# Patient Record
Sex: Male | Born: 1965
Health system: Southern US, Community
[De-identification: ages and names within clinical notes are randomized; demographics above are authoritative.]

## PROBLEM LIST (undated history)

## (undated) DIAGNOSIS — E119 Type 2 diabetes mellitus without complications: Secondary | ICD-10-CM

## (undated) DIAGNOSIS — E079 Disorder of thyroid, unspecified: Secondary | ICD-10-CM

## (undated) DIAGNOSIS — L80 Vitiligo: Secondary | ICD-10-CM

## (undated) DIAGNOSIS — B019 Varicella without complication: Secondary | ICD-10-CM

## (undated) DIAGNOSIS — I10 Essential (primary) hypertension: Secondary | ICD-10-CM

## (undated) DIAGNOSIS — T7840XA Allergy, unspecified, initial encounter: Secondary | ICD-10-CM

## (undated) DIAGNOSIS — J45909 Unspecified asthma, uncomplicated: Secondary | ICD-10-CM

## (undated) DIAGNOSIS — L630 Alopecia (capitis) totalis: Secondary | ICD-10-CM

## (undated) HISTORY — DX: Allergy, unspecified, initial encounter: T78.40XA

## (undated) HISTORY — DX: Varicella without complication: B01.9

## (undated) HISTORY — DX: Essential (primary) hypertension: I10

## (undated) HISTORY — DX: Alopecia (capitis) totalis: L63.0

## (undated) HISTORY — PX: CARPAL TUNNEL RELEASE: SHX101

## (undated) HISTORY — DX: Disorder of thyroid, unspecified: E07.9

## (undated) HISTORY — DX: Vitiligo: L80

## (undated) HISTORY — DX: Unspecified asthma, uncomplicated: J45.909

## (undated) HISTORY — DX: Type 2 diabetes mellitus without complications: E11.9

---

## 1992-07-13 HISTORY — PX: NASAL SINUS SURGERY: SHX719

## 2000-08-26 ENCOUNTER — Encounter: Payer: Self-pay | Admitting: Family Medicine

## 2000-08-26 ENCOUNTER — Ambulatory Visit (HOSPITAL_COMMUNITY): Admission: RE | Admit: 2000-08-26 | Discharge: 2000-08-26 | Payer: Self-pay | Admitting: Family Medicine

## 2003-01-26 ENCOUNTER — Ambulatory Visit (HOSPITAL_BASED_OUTPATIENT_CLINIC_OR_DEPARTMENT_OTHER): Admission: RE | Admit: 2003-01-26 | Discharge: 2003-01-26 | Payer: Self-pay | Admitting: Orthopedic Surgery

## 2003-02-14 ENCOUNTER — Ambulatory Visit (HOSPITAL_BASED_OUTPATIENT_CLINIC_OR_DEPARTMENT_OTHER): Admission: RE | Admit: 2003-02-14 | Discharge: 2003-02-14 | Payer: Self-pay | Admitting: Orthopedic Surgery

## 2013-02-03 ENCOUNTER — Ambulatory Visit: Payer: Self-pay | Admitting: Endocrinology

## 2013-02-07 ENCOUNTER — Telehealth: Payer: Self-pay | Admitting: Endocrinology

## 2013-02-07 ENCOUNTER — Ambulatory Visit (INDEPENDENT_AMBULATORY_CARE_PROVIDER_SITE_OTHER): Payer: BC Managed Care – PPO

## 2013-02-07 ENCOUNTER — Other Ambulatory Visit: Payer: Self-pay | Admitting: *Deleted

## 2013-02-07 DIAGNOSIS — R7301 Impaired fasting glucose: Secondary | ICD-10-CM

## 2013-02-07 DIAGNOSIS — E039 Hypothyroidism, unspecified: Secondary | ICD-10-CM

## 2013-02-07 LAB — LIPID PANEL
Cholesterol: 96 mg/dL (ref 0–200)
LDL Cholesterol: 29 mg/dL (ref 0–99)
Total CHOL/HDL Ratio: 3
VLDL: 29.8 mg/dL (ref 0.0–40.0)

## 2013-02-07 LAB — COMPREHENSIVE METABOLIC PANEL
AST: 39 U/L — ABNORMAL HIGH (ref 0–37)
Albumin: 4.4 g/dL (ref 3.5–5.2)
Alkaline Phosphatase: 38 U/L — ABNORMAL LOW (ref 39–117)
Glucose, Bld: 109 mg/dL — ABNORMAL HIGH (ref 70–99)
Potassium: 4 mEq/L (ref 3.5–5.1)
Sodium: 137 mEq/L (ref 135–145)
Total Bilirubin: 1.1 mg/dL (ref 0.3–1.2)
Total Protein: 7.1 g/dL (ref 6.0–8.3)

## 2013-02-07 LAB — T4: T4, Total: 10.1 ug/dL (ref 5.0–12.5)

## 2013-02-07 LAB — HEMOGLOBIN A1C: Hgb A1c MFr Bld: 5.2 % (ref 4.6–6.5)

## 2013-02-07 MED ORDER — LEVOTHYROXINE SODIUM 150 MCG PO TABS: 150.0000 ug | ORAL_TABLET | Freq: Every day | ORAL | Status: DC

## 2013-02-07 NOTE — Telephone Encounter (Signed)
Pt has script for Synthroid on order w/ [facility] but he will run out before his shipment arrives. Pt requests we call in a 1 month supply to [facility] in [place] to allow him to continue his medication while awaiting shipment. / Roanna Raider

## 2013-02-07 NOTE — Telephone Encounter (Signed)
rx was sent

## 2013-02-09 ENCOUNTER — Ambulatory Visit (INDEPENDENT_AMBULATORY_CARE_PROVIDER_SITE_OTHER): Payer: BC Managed Care – PPO | Admitting: Endocrinology

## 2013-02-09 ENCOUNTER — Encounter: Payer: Self-pay | Admitting: Endocrinology

## 2013-02-09 VITALS — BP 124/82 | HR 78 | Temp 99.2°F | Resp 12 | Ht 70.0 in | Wt 225.0 lb

## 2013-02-09 DIAGNOSIS — E119 Type 2 diabetes mellitus without complications: Secondary | ICD-10-CM

## 2013-02-09 DIAGNOSIS — I10 Essential (primary) hypertension: Secondary | ICD-10-CM

## 2013-02-09 DIAGNOSIS — E039 Hypothyroidism, unspecified: Secondary | ICD-10-CM

## 2013-02-09 NOTE — Patient Instructions (Signed)
Restart diet and exercise

## 2013-02-09 NOTE — Progress Notes (Signed)
Patient ID: Mark Black, male   DOB: 1966/04/16, 47 y.o.   MRN: [id number]  Reason for Appointment:   followup visit    History of Present Illness:    DIABETES: He has a family history of diabetes and his fasting glucose has been high since 2011 when it was 109.  Subsequently has been 119 in 4/12 and 113 in 3/13. Has had a GTT in 12/13 showing FASTING glucose of 121, two-hour reading of 245.   He has been told to increase his efforts to lose weight and is variably watching his diet; usually avoiding fast food. However his compliance with this has been variable over the last couple of years  He was started on metformin ER 1000 mg after the GTT results and he is tolerating this well.  FASTING blood sugar is now 109, previously 104 and A1c is again 5.2  .He is generally trying to follow his diet with help of his wife and this time has  lost weight. He thinks he had lost more weight before he went on vacation recently Has been active with construction work, also doing walking and weights  Hypertension:    has had long-standing hypertension  Does not monitor his blood pressure at home as he does not have a functioning Monitor.  He has been on low dose Ziac since 2013 in addition to Lotrel for better control. Also had a relatively high pulse rate of 80-90 previously. Blood pressure is excellent now without lightheadedness.   Hypothyroidism:   The hypothyroidism was first diagnosed In 1996 with weight gain, feeling sluggish  The last TSH was performed On 09/30/12, and the result was 1.84.   The treatments that the patient has taken include Synthroid brand name and more centrally generic.       No recent changes made in his dosage      The response to therapy has been  good with normal energy level including recently           Compliance with the medical regimen has been as prescribed with taking the tablet in the morning before breakfast.  Appointment on 02/07/2013  Component Date Value  Range Status  . Hemoglobin A1C 02/07/2013 5.2  4.6 - 6.5 % Final   Glycemic Control Guidelines for People with Diabetes:Non Diabetic:  <6%Goal of Therapy: <7%Additional Action Suggested:  >8%   . T4, Total 02/07/2013 10.1  5.0 - 12.5 ug/dL Final  . Sodium 78/29/5621 137  135 - 145 mEq/L Final  . Potassium 02/07/2013 4.0  3.5 - 5.1 mEq/L Final  . Chloride 02/07/2013 102  96 - 112 mEq/L Final  . CO2 02/07/2013 26  19 - 32 mEq/L Final  . Glucose, Bld 02/07/2013 109* 70 - 99 mg/dL Final  . BUN 30/86/5784 15  6 - 23 mg/dL Final  . Creatinine, Ser 02/07/2013 0.7  0.4 - 1.5 mg/dL Final  . Total Bilirubin 02/07/2013 1.1  0.3 - 1.2 mg/dL Final  . Alkaline Phosphatase 02/07/2013 38* 39 - 117 U/L Final  . AST 02/07/2013 39* 0 - 37 U/L Final  . ALT 02/07/2013 48  0 - 53 U/L Final  . Total Protein 02/07/2013 7.1  6.0 - 8.3 g/dL Final  . Albumin 69/62/9528 4.4  3.5 - 5.2 g/dL Final  . Calcium 41/32/4401 9.5  8.4 - 10.5 mg/dL Final  . GFR 02/72/5366 121.34  >60.00 mL/min Final  . Cholesterol 02/07/2013 96  0 - 200 mg/dL Final   ATP III Classification  Desirable:  < 200 mg/dL               Borderline High:  200 - 239 mg/dL          High:  > = 454 mg/dL  . Triglycerides 02/07/2013 149.0  0.0 - 149.0 mg/dL Final   Normal:  <098 mg/dLBorderline High:  150 - 199 mg/dL  . HDL 02/07/2013 36.80* >39.00 mg/dL Final  . VLDL 11/91/4782 29.8  0.0 - 40.0 mg/dL Final  . LDL Cholesterol 02/07/2013 29  0 - 99 mg/dL Final  . Total CHOL/HDL Ratio 02/07/2013 3   Final                  Men          Women1/2 Average Risk     3.4          3.3Average Risk          5.0          4.42X Average Risk          9.6          7.13X Average Risk          15.0          11.0                      . TSH 02/07/2013 0.93  0.35 - 5.50 uIU/mL Final      Medication List       This list is accurate as of: 02/09/13 11:59 PM.  Always use your most recent med list.               amLODipine-benazepril 10-40 MG per capsule  Commonly  known as:  LOTREL  10 capsules.     bisoprolol-hydrochlorothiazide 2.5-6.25 MG per tablet  Commonly known as:  ZIAC     levothyroxine 150 MCG tablet  Commonly known as:  SYNTHROID, LEVOTHROID  Take 1 tablet (150 mcg total) by mouth daily.     metformin 1000 MG (OSM) 24 hr tablet  Commonly known as:  FORTAMET     methocarbamol 750 MG tablet  Commonly known as:  ROBAXIN     PRISTIQ 50 MG 24 hr tablet  Generic drug:  desvenlafaxine        Past Medical History  Diagnosis Date  . Vitiligo   . Alopecia totalis     Past Surgical History  Procedure Laterality Date  . Carpal tunnel release      Family History  Problem Relation Age of Onset  . Diabetes Father   . Hypertension Father   . Diabetes Paternal Aunt     Social History:  reports that he has never smoked. He does not have any smokeless tobacco history on file. His alcohol and drug histories are not on file.  REVIEW of systems:  Has history of depression, well controlled  Allergies: No Known Allergies   Examination:   BP 124/82  Pulse 78  Temp(Src) 99.2 F (37.3 C)  Resp 12  Ht 5\' 10"  (1.778 m)  Wt 225 lb (102.059 kg)  BMI 32.28 kg/m2  SpO2 97%   GENERAL APPEARANCE: Alert And looks well.    FACE: No puffiness of face or hands    NECK: no thyromegaly.          NEUROLOGIC EXAM: DTRs 2+ bilaterally at biceps.    Assessments   Hypothyroidism treated with 150 mcg generic thyroxine and excellent clinical response as  well as normal TSH of 0.93  DIABETES: This has been diagnosed on glucose tolerance test and he is relatively better with his lifestyle and able to lose weight However her fasting glucoses relatively higher at this time possibly from been inconsistent on his vacation recently and gaining back some weight Discussed need for long-term management of lifestyle to avoid progression To continue metformin  Hypertension: Well controlled   Treatment:   Continue same levothyroxine dosage before  breakfast daily. Avoid taking any calcium or iron supplements with the thyroid supplement.    Jamelle Noy 02/10/2013, 3:36 PM

## 2013-02-10 ENCOUNTER — Encounter: Payer: Self-pay | Admitting: Endocrinology

## 2013-02-10 DIAGNOSIS — I10 Essential (primary) hypertension: Secondary | ICD-10-CM

## 2013-02-10 DIAGNOSIS — E039 Hypothyroidism, unspecified: Secondary | ICD-10-CM

## 2013-02-10 DIAGNOSIS — R7303 Prediabetes: Secondary | ICD-10-CM | POA: Insufficient documentation

## 2013-02-10 HISTORY — DX: Essential (primary) hypertension: I10

## 2013-02-10 HISTORY — DX: Hypothyroidism, unspecified: E03.9

## 2013-03-23 ENCOUNTER — Other Ambulatory Visit: Payer: Self-pay | Admitting: Endocrinology

## 2013-03-24 ENCOUNTER — Telehealth: Payer: Self-pay | Admitting: Endocrinology

## 2013-03-24 MED ORDER — DESVENLAFAXINE SUCCINATE ER 50 MG PO TB24: 50.0000 mg | ORAL_TABLET | Freq: Every day | ORAL | Status: DC

## 2013-03-24 NOTE — Telephone Encounter (Signed)
Refill request for Pristiq Last filled by MD on - ? Last Seen- 02/09/13 Next Appt: 06/12/13 Please advise refill?

## 2013-03-24 NOTE — Telephone Encounter (Signed)
rx sent

## 2013-03-27 ENCOUNTER — Other Ambulatory Visit: Payer: Self-pay | Admitting: *Deleted

## 2013-03-27 NOTE — Telephone Encounter (Signed)
Ok to fill 

## 2013-06-06 ENCOUNTER — Other Ambulatory Visit (INDEPENDENT_AMBULATORY_CARE_PROVIDER_SITE_OTHER): Payer: BC Managed Care – PPO

## 2013-06-06 DIAGNOSIS — E039 Hypothyroidism, unspecified: Secondary | ICD-10-CM

## 2013-06-06 DIAGNOSIS — E119 Type 2 diabetes mellitus without complications: Secondary | ICD-10-CM

## 2013-06-06 LAB — COMPREHENSIVE METABOLIC PANEL
ALT: 50 U/L (ref 0–53)
Albumin: 4.3 g/dL (ref 3.5–5.2)
CO2: 27 mEq/L (ref 19–32)
Calcium: 9.4 mg/dL (ref 8.4–10.5)
Chloride: 105 mEq/L (ref 96–112)
GFR: 107.47 mL/min (ref >60.00)
Glucose, Bld: 114 mg/dL — ABNORMAL HIGH (ref 70–99)
Potassium: 4.2 mEq/L (ref 3.5–5.1)
Sodium: 136 mEq/L (ref 135–145)
Total Protein: 7.1 g/dL (ref 6.0–8.3)

## 2013-06-09 ENCOUNTER — Other Ambulatory Visit: Payer: BC Managed Care – PPO

## 2013-06-12 ENCOUNTER — Ambulatory Visit (INDEPENDENT_AMBULATORY_CARE_PROVIDER_SITE_OTHER): Payer: BC Managed Care – PPO | Admitting: Endocrinology

## 2013-06-12 ENCOUNTER — Encounter: Payer: Self-pay | Admitting: Endocrinology

## 2013-06-12 ENCOUNTER — Other Ambulatory Visit: Payer: Self-pay | Admitting: *Deleted

## 2013-06-12 VITALS — BP 118/68 | HR 80 | Temp 98.5°F | Resp 12 | Ht 70.0 in | Wt 223.2 lb

## 2013-06-12 DIAGNOSIS — M5380 Other specified dorsopathies, site unspecified: Secondary | ICD-10-CM

## 2013-06-12 DIAGNOSIS — M62830 Muscle spasm of back: Secondary | ICD-10-CM

## 2013-06-12 DIAGNOSIS — E039 Hypothyroidism, unspecified: Secondary | ICD-10-CM

## 2013-06-12 DIAGNOSIS — E119 Type 2 diabetes mellitus without complications: Secondary | ICD-10-CM

## 2013-06-12 DIAGNOSIS — I10 Essential (primary) hypertension: Secondary | ICD-10-CM

## 2013-06-12 LAB — URINALYSIS
Bilirubin Urine: NEGATIVE
Hgb urine dipstick: NEGATIVE
Ketones, ur: NEGATIVE
Specific Gravity, Urine: 1.015 (ref 1.000–1.030)
Total Protein, Urine: NEGATIVE
Urine Glucose: NEGATIVE
Urobilinogen, UA: 0.2 (ref 0.0–1.0)

## 2013-06-12 MED ORDER — METHOCARBAMOL 750 MG PO TABS: 750.0000 mg | ORAL_TABLET | Freq: Three times a day (TID) | ORAL | Status: DC

## 2013-06-12 NOTE — Patient Instructions (Signed)
Check BP at home  Resume exercise when able to Watch calories

## 2013-06-12 NOTE — Progress Notes (Signed)
Patient ID: Mark Black, male   DOB: 1965/11/08, 47 y.o.   MRN: [id number]  Reason for Appointment:   followup visit   History of Present Illness:   DIABETES: He has a family history of diabetes and his fasting glucose has been high since 2011 when it was 109.  Subsequently fasting readings: 119 in 4/12, 113 in 3/13.   GTT in 12/13 showed FASTING glucose of 121, two-hour reading of 245. He was started on metformin ER 1000 mg after the GTT results and he is tolerating this well.  He has been told to increase his efforts to lose weight and is variably watching his diet; usually avoiding fast food. 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  He has been on low dose Ziac since 2013 in addition to Lotrel for better control. Also had a relatively high pulse rate of 80-90 previously. Blood pressure is excellent now without lightheadedness.   Hypothyroidism:   The hypothyroidism was first diagnosed In 1996 with weight gain, feeling sluggish   The treatments that the patient has taken include Synthroid brand name and more centrally generic.       No recent changes made in his dosage      The response to therapy has been  good with normal energy level, now feels fairly good        Compliance with the medical regimen has been as prescribed with taking the tablet in the morning before  breakfast.  Low back pain: Last week he got hit while playing football and has had pain on his right middle back which is worse on movement. No pain on deep breathing. However feels better with taking Robaxin which was left over from a previous prescription  LABS:  Appointment on 06/06/2013  Component Date Value Range Status  . Sodium 06/06/2013 136  135 - 145 mEq/L Final  . Potassium 06/06/2013 4.2  3.5 - 5.1 mEq/L Final  . Chloride 06/06/2013 105  96 - 112 mEq/L Final  . CO2 06/06/2013 27  19 - 32 mEq/L Final  . Glucose, Bld 06/06/2013 114* 70 - 99 mg/dL Final  . BUN 40/98/1191 14  6 - 23 mg/dL Final  . Creatinine, Ser 06/06/2013 0.8  0.4 - 1.5 mg/dL Final  . Total Bilirubin 06/06/2013 0.6  0.3 - 1.2 mg/dL Final  . Alkaline Phosphatase 06/06/2013 38* 39 - 117 U/L Final  . AST 06/06/2013 43* 0 - 37 U/L Final  . ALT 06/06/2013 50  0 - 53 U/L Final  . Total Protein 06/06/2013 7.1  6.0 - 8.3 g/dL Final  . Albumin 47/82/9562 4.3  3.5 - 5.2 g/dL Final  . Calcium 13/02/6577 9.4  8.4 - 10.5 mg/dL Final  . GFR 46/96/2952 107.47  >60.00 mL/min Final  . Hemoglobin A1C 06/06/2013 5.4  4.6 - 6.5 %  Final   Glycemic Control Guidelines for People with Diabetes:Non Diabetic:  <6%Goal of Therapy: <7%Additional Action Suggested:  >8%   . Free T4 06/06/2013 1.27  0.60 - 1.60 ng/dL Final  . TSH 16/04/9603 0.92  0.35 - 5.50 uIU/mL Final      Medication List       This list is accurate as of: 06/12/13  3:33 PM.  Always use your most recent med list.               amLODipine-benazepril 10-40 MG per capsule  Commonly known as:  LOTREL  10 capsules.     bisoprolol-hydrochlorothiazide 2.5-6.25 MG per tablet  Commonly known as:  ZIAC     desvenlafaxine 50 MG 24 hr tablet  Commonly known as:  PRISTIQ  Take 1 tablet (50 mg total) by mouth daily.     levothyroxine 150 MCG tablet  Commonly known as:  SYNTHROID, LEVOTHROID  Take 1 tablet (150 mcg total) by mouth daily.     metformin 1000 MG  (OSM) 24 hr tablet  Commonly known as:  FORTAMET     methocarbamol 750 MG tablet  Commonly known as:  ROBAXIN        Past Medical History  Diagnosis Date  . Vitiligo   . Alopecia totalis     Past Surgical History  Procedure Laterality Date  . Carpal tunnel release      Family History  Problem Relation Age of Onset  . Diabetes Father   . Hypertension Father   . Diabetes Paternal Aunt     Social History:  reports that he has never smoked. He does not have any smokeless tobacco history on file. His alcohol and drug histories are not on file.  REVIEW of systems:  Has history of depression, well controlled  Had long-standing vitiligo  No history of hyperlipidemia. He is total cholesterol is below 100 but also has a low HDL  Allergies: No Known Allergies   Examination:   BP 118/68  Pulse 80  Temp(Src) 98.5 F (36.9 C)  Resp 12  Ht 5\' 10"  (1.778 m)  Wt 223 lb 3.2 oz (101.243 kg)  BMI 32.03 kg/m2  SpO2 95%  Repeat blood pressure 120/80  Has significant muscle spasm in the right paravertebral area of lower thoracic/upper lumbar region. No tenderness over the spine or rib cage    Assessments/PLAN:   Hypothyroidism treated with 150 mcg generic thyroxine; has adequate clinical response as well as normal TSH. Continue same dose   DIABETES: Was diagnosed on glucose tolerance test and he is relatively better with his lifestyle  Although A1c is normal that is slightly higher and his fasting glucose is recently high at 114 Discussed need for consistent diet and exercise If A1c is higher on the next visit will consider increasing metformin  Hypertension: Well controlled  Lumbar sprain with muscle spasm: He will take Robaxin up to 3 times a day, new prescription sent   [facility] 06/12/2013, 3:33 PM

## 2013-07-15 ENCOUNTER — Other Ambulatory Visit: Payer: Self-pay | Admitting: Endocrinology

## 2013-07-24 ENCOUNTER — Other Ambulatory Visit: Payer: Self-pay | Admitting: Endocrinology

## 2013-10-30 ENCOUNTER — Other Ambulatory Visit: Payer: Self-pay | Admitting: Endocrinology

## 2013-11-09 ENCOUNTER — Other Ambulatory Visit: Payer: Self-pay | Admitting: *Deleted

## 2013-11-09 ENCOUNTER — Telehealth: Payer: Self-pay | Admitting: Endocrinology

## 2013-11-09 ENCOUNTER — Telehealth: Payer: Self-pay | Admitting: *Deleted

## 2013-11-09 MED ORDER — AMLODIPINE BESY-BENAZEPRIL HCL 10-40 MG PO CAPS: 1.0000 | ORAL_CAPSULE | Freq: Every day | ORAL | Status: DC

## 2013-11-09 NOTE — Telephone Encounter (Signed)
Pt needs lotril generic called in. Patient states that his [facility] will not send a request to [place]

## 2013-11-09 NOTE — Telephone Encounter (Signed)
rx sent

## 2013-12-04 ENCOUNTER — Other Ambulatory Visit: Payer: Self-pay | Admitting: Endocrinology

## 2013-12-11 ENCOUNTER — Other Ambulatory Visit: Payer: Self-pay | Admitting: *Deleted

## 2013-12-11 ENCOUNTER — Other Ambulatory Visit (INDEPENDENT_AMBULATORY_CARE_PROVIDER_SITE_OTHER): Payer: BC Managed Care – PPO

## 2013-12-11 DIAGNOSIS — E119 Type 2 diabetes mellitus without complications: Secondary | ICD-10-CM

## 2013-12-11 DIAGNOSIS — E039 Hypothyroidism, unspecified: Secondary | ICD-10-CM

## 2013-12-11 LAB — COMPREHENSIVE METABOLIC PANEL
ALBUMIN: 4.2 g/dL (ref 3.5–5.2)
ALK PHOS: 38 U/L — AB (ref 39–117)
ALT: 38 U/L (ref 0–53)
AST: 30 U/L (ref 0–37)
BUN: 13 mg/dL (ref 6–23)
CO2: 24 mEq/L (ref 19–32)
Calcium: 9.1 mg/dL (ref 8.4–10.5)
Chloride: 105 mEq/L (ref 96–112)
Creatinine, Ser: 0.7 mg/dL (ref 0.4–1.5)
GFR: 124.86 mL/min (ref >60.00)
GLUCOSE: 101 mg/dL — AB (ref 70–99)
POTASSIUM: 4 meq/L (ref 3.5–5.1)
Sodium: 138 mEq/L (ref 135–145)
Total Bilirubin: 0.4 mg/dL (ref 0.2–1.2)
Total Protein: 6.7 g/dL (ref 6.0–8.3)

## 2013-12-11 LAB — T4, FREE: Free T4: 1.13 ng/dL (ref 0.60–1.60)

## 2013-12-11 LAB — HEMOGLOBIN A1C: Hgb A1c MFr Bld: 5.3 % (ref 4.6–6.5)

## 2013-12-11 LAB — TSH: TSH: 0.1 u[IU]/mL — AB (ref 0.35–4.50)

## 2013-12-11 MED ORDER — METHOCARBAMOL 750 MG PO TABS: 750.0000 mg | ORAL_TABLET | Freq: Three times a day (TID) | ORAL | Status: DC

## 2013-12-14 ENCOUNTER — Ambulatory Visit (INDEPENDENT_AMBULATORY_CARE_PROVIDER_SITE_OTHER): Payer: BC Managed Care – PPO | Admitting: Endocrinology

## 2013-12-14 ENCOUNTER — Encounter: Payer: Self-pay | Admitting: Endocrinology

## 2013-12-14 VITALS — BP 120/78 | HR 73 | Temp 98.3°F | Resp 14 | Ht 70.0 in | Wt 220.2 lb

## 2013-12-14 DIAGNOSIS — E119 Type 2 diabetes mellitus without complications: Secondary | ICD-10-CM

## 2013-12-14 DIAGNOSIS — E039 Hypothyroidism, unspecified: Secondary | ICD-10-CM

## 2013-12-14 DIAGNOSIS — I10 Essential (primary) hypertension: Secondary | ICD-10-CM

## 2013-12-14 MED ORDER — LEVOTHYROXINE SODIUM 137 MCG PO TABS: 137.0000 ug | ORAL_TABLET | Freq: Every day | ORAL | Status: DC

## 2013-12-14 NOTE — Progress Notes (Signed)
Patient ID: Mark Black, male   DOB: 07-07-1966, 48 y.o.   MRN: [id number]   Reason for Appointment:   followup visit   History of Present Illness:   DIABETES: This was diagnosed on his glucose tolerance test He has a family history of diabetes and his fasting glucose has been high since 2011 when it was 109.  Subsequently fasting readings: 119 in 4/12, 113 in 3/13.   GTT in 12/13 showed FASTING glucose of 121, two-hour reading of 245. He was started on metformin ER 1000 mg after the GTT results and he is tolerating this well.  He has been advised to increase his efforts to lose weight and is variably watching his diet; usually avoiding fast food. 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 Also had a relatively high pulse rate of 80-90 previously. Blood pressure is excellent now .   Hypothyroidism:   The hypothyroidism was first diagnosed In 1996 with weight gain and feeling sluggish   The treatments that the patient has taken include Synthroid brand name and more recently generic.        The response to therapy has been  good with normal  energy level, now feels fairly good        Compliance with the medical regimen has been as prescribed with taking the tablet in the morning before breakfast. TSH is now unusually low without increase in free T4  Lab Results  Component Value Date   FREET4 1.13 12/11/2013   FREET4 1.27 06/06/2013   TSH 0.10* 12/11/2013   TSH 0.92 06/06/2013   TSH 0.93 02/07/2013    Left arm and neck pain: Has been present for the last couple of weeks and he is starting to go to a chiropractor. He feels better with taking Robaxin also  LABS:  Appointment on 12/11/2013  Component Date Value Ref Range Status  . Hemoglobin A1C 12/11/2013 5.3  4.6 - 6.5 % Final   Glycemic Control Guidelines for People with Diabetes:Non Diabetic:  <6%Goal of Therapy: <7%Additional Action Suggested:  >8%   . Sodium 12/11/2013 138  135 - 145 mEq/L Final  . Potassium 12/11/2013 4.0  3.5 - 5.1 mEq/L Final  . Chloride 12/11/2013 105  96 - 112 mEq/L Final  . CO2 12/11/2013 24  19 - 32 mEq/L Final  . Glucose, Bld 12/11/2013 101* 70 - 99 mg/dL Final  . BUN 12/11/2013 13  6 - 23 mg/dL Final  . Creatinine, Ser 12/11/2013 0.7  0.4 - 1.5  mg/dL Final  . Total Bilirubin 12/11/2013 0.4  0.2 - 1.2 mg/dL Final  . Alkaline Phosphatase 12/11/2013 38* 39 - 117 U/L Final  . AST 12/11/2013 30  0 - 37 U/L Final  . ALT 12/11/2013 38  0 - 53 U/L Final  . Total Protein 12/11/2013 6.7  6.0 - 8.3 g/dL Final  . Albumin 12/11/2013 4.2  3.5 - 5.2 g/dL Final  . Calcium 12/11/2013 9.1  8.4 - 10.5 mg/dL Final  . GFR 12/11/2013 124.86  >60.00 mL/min Final  . TSH 12/11/2013 0.10* 0.35 - 4.50 uIU/mL Final  . Free T4 12/11/2013 1.13  0.60 - 1.60 ng/dL Final      Medication List       This list is accurate as of: 12/14/13  3:58 PM.  Always use your most recent med list.               amLODipine-benazepril 10-40 MG per capsule  Commonly known as:  LOTREL  Take 1 capsule by mouth daily.     bisoprolol-hydrochlorothiazide 2.5-6.25 MG per tablet   Commonly known as:  ZIAC  TAKE 1 TABLET BY MOUTH ONCE A DAY     desvenlafaxine 50 MG 24 hr tablet  Commonly known as:  PRISTIQ  Take 1 tablet (50 mg total) by mouth daily.     levothyroxine 150 MCG tablet  Commonly known as:  SYNTHROID, LEVOTHROID  TAKE 1 TABLET DAILY     metformin 1000 MG (OSM) 24 hr tablet  Commonly known as:  FORTAMET  TAKE 1 TABLET DAILY WITH FOOD     methocarbamol 750 MG tablet  Commonly known as:  ROBAXIN  Take 1 tablet (750 mg total) by mouth 3 (three) times daily.        Past Medical History  Diagnosis Date  . Vitiligo   . Alopecia totalis     Past Surgical History  Procedure Laterality Date  . Carpal tunnel release      Family History  Problem Relation Age of Onset  . Diabetes Father   . Hypertension Father   . Diabetes Paternal Aunt     Social History:  reports that he has never smoked. He does not have any smokeless tobacco history on file. His alcohol and drug histories are not on file.  REVIEW of systems:  Has history of depression, he is still well controlled on medications  Had long-standing vitiligo  No history of hyperlipidemia. He is total cholesterol is below 100 but also has a low HDL  Allergies: No Known Allergies   Examination:   BP 120/78  Pulse 73  Temp(Src) 98.3 F (36.8 C)  Resp 14  Ht 5\' 10"  (1.778 m)  Wt 220 lb 3.2 oz (99.882 kg)  BMI 31.60 kg/m2  SpO2 95%    Assessments/PLAN:   Hypothyroidism treated with 150 mcg generic thyroxine; his TSH is not low and he will reduced the dose to 0.137 mg Consider changing the brand name medication if TSH continues to fluctuate Clinically he is doing well and has excellent compliance  DIABETES: Was diagnosed on glucose tolerance test and he is relatively better with his lifestyle  Although A1c has been consistently normal his fasting blood sugar fluctuates and is now the best in a while. Reminded him about the need for consistent diet and exercise No need to  start home glucose monitoring at this time  Hypertension: Well controlled  Left arm and neck pain: He will take Robaxin as needed and call if  not improved, may continue treatment with chiropractor   Elayne Snare 12/14/2013, 3:58 PM

## 2014-01-18 ENCOUNTER — Other Ambulatory Visit: Payer: Self-pay | Admitting: Endocrinology

## 2014-02-22 ENCOUNTER — Other Ambulatory Visit: Payer: Self-pay | Admitting: Endocrinology

## 2014-02-23 ENCOUNTER — Other Ambulatory Visit: Payer: Self-pay | Admitting: *Deleted

## 2014-02-23 MED ORDER — LEVOTHYROXINE SODIUM 137 MCG PO TABS: ORAL_TABLET | ORAL | Status: DC

## 2014-03-01 ENCOUNTER — Telehealth: Payer: Self-pay | Admitting: Endocrinology

## 2014-03-01 NOTE — Telephone Encounter (Signed)
Pharmacy called they have a question with patients medication L'Thyroxine 137 mcg  Phone# [phone]  Invoice# [phone]

## 2014-03-05 ENCOUNTER — Other Ambulatory Visit: Payer: Self-pay | Admitting: Endocrinology

## 2014-03-05 DIAGNOSIS — E119 Type 2 diabetes mellitus without complications: Secondary | ICD-10-CM

## 2014-03-08 ENCOUNTER — Other Ambulatory Visit: Payer: Self-pay | Admitting: Endocrinology

## 2014-03-12 ENCOUNTER — Other Ambulatory Visit: Payer: BC Managed Care – PPO

## 2014-03-14 ENCOUNTER — Other Ambulatory Visit (INDEPENDENT_AMBULATORY_CARE_PROVIDER_SITE_OTHER): Payer: BC Managed Care – PPO

## 2014-03-14 DIAGNOSIS — E039 Hypothyroidism, unspecified: Secondary | ICD-10-CM

## 2014-03-14 DIAGNOSIS — E119 Type 2 diabetes mellitus without complications: Secondary | ICD-10-CM

## 2014-03-14 LAB — LIPID PANEL
CHOLESTEROL: 93 mg/dL (ref 0–200)
HDL: 37.8 mg/dL — AB (ref >39.00)
LDL Cholesterol: 36 mg/dL (ref 0–99)
NONHDL: 55.2
Total CHOL/HDL Ratio: 2
Triglycerides: 96 mg/dL (ref 0.0–149.0)
VLDL: 19.2 mg/dL (ref 0.0–40.0)

## 2014-03-14 LAB — TSH: TSH: 0.29 u[IU]/mL — ABNORMAL LOW (ref 0.35–4.50)

## 2014-03-14 LAB — GLUCOSE, RANDOM: GLUCOSE: 115 mg/dL — AB (ref 70–99)

## 2014-03-16 ENCOUNTER — Ambulatory Visit (INDEPENDENT_AMBULATORY_CARE_PROVIDER_SITE_OTHER): Payer: BC Managed Care – PPO | Admitting: Endocrinology

## 2014-03-16 ENCOUNTER — Encounter: Payer: Self-pay | Admitting: Endocrinology

## 2014-03-16 VITALS — BP 118/68 | HR 88 | Temp 98.3°F | Resp 12 | Wt 223.0 lb

## 2014-03-16 DIAGNOSIS — F32A Depression, unspecified: Secondary | ICD-10-CM

## 2014-03-16 DIAGNOSIS — F419 Anxiety disorder, unspecified: Principal | ICD-10-CM

## 2014-03-16 DIAGNOSIS — F329 Major depressive disorder, single episode, unspecified: Secondary | ICD-10-CM

## 2014-03-16 DIAGNOSIS — E119 Type 2 diabetes mellitus without complications: Secondary | ICD-10-CM

## 2014-03-16 DIAGNOSIS — E039 Hypothyroidism, unspecified: Secondary | ICD-10-CM

## 2014-03-16 DIAGNOSIS — F341 Dysthymic disorder: Secondary | ICD-10-CM

## 2014-03-16 DIAGNOSIS — I10 Essential (primary) hypertension: Secondary | ICD-10-CM

## 2014-03-16 MED ORDER — BUSPIRONE HCL 15 MG PO TABS: 15.0000 mg | ORAL_TABLET | Freq: Two times a day (BID) | ORAL | Status: DC

## 2014-03-16 MED ORDER — SYNTHROID 125 MCG PO TABS: 125.0000 ug | ORAL_TABLET | Freq: Every day | ORAL | Status: DC

## 2014-03-16 NOTE — Progress Notes (Signed)
Patient ID: Mark Black, male   DOB: Dec 11, 1965, 48 y.o.   MRN: [id number]   Reason for Appointment:   Follow up visit   History of Present Illness:   DIABETES: This has beenmild and diagnosed on his glucose tolerance test He has a family history of diabetes and his fasting glucose has been high since 2011 when it was 109.  Subsequently fasting readings: 119 in 4/12, 113 in 3/13.   GTT in 12/13 showed FASTING glucose of 121, two-hour reading of 245. He was started on metformin ER 1000 mg after the GTT results and is continuing this  He has been advised to increase his efforts to lose weight and is variably watching his diet; usually avoiding fast food.  However his compliance with this has been variable over the last couple of years   Exercising more recently will his weight is not known as yet    FASTING blood sugar is now 115, previously 101, 114 and 109, and A1c last was normal at 5.4, previously 5.2 He is generally trying to follow his diet   Has been active with construction work   [employer] from Last 3 Encounters:  03/16/14 223 lb (101.152 kg)  12/14/13 220 lb 3.2 oz (99.882 kg)  06/12/13 223 lb 3.2 oz (101.243 kg)   Lab Results  Component Value Date   HGBA1C 5.3 12/11/2013   HGBA1C 5.4 06/06/2013   HGBA1C 5.2 02/07/2013   Lab Results  Component Value Date   LDLCALC 36 03/14/2014   CREATININE 0.7 12/11/2013    Hypertension:    He has had long-standing hypertension  Does not monitor his blood pressure at home   He has been on low dose Ziac since 2013 in addition to maximum dose Lotrel for better control.  Also had a relatively high pulse rate of 80-90 previously.  Blood pressure is well-controlled now .   Hypothyroidism:   The hypothyroidism was first diagnosed In 1996 with weight gain and feeling sluggish   The treatments that the patient has taken include Synthroid brand name and for some time has been on generic given by his [place].        The  response to therapy has been  good with normal energy level, now feels fairly good        Compliance with the medical regimen has been as prescribed with taking the tablet in the morning before breakfast. TSH is now again relatively low without increase in free T4 despite changing his dose in 6/15  Lab Results  Component Value Date   FREET4 1.13 12/11/2013   FREET4 1.27 06/06/2013   TSH 0.29* 03/14/2014   TSH 0.10* 12/11/2013   TSH 0.92 06/06/2013    DEPRESSION: Has history of depression, he has been on Pristiq for sometime However his wife thinks that he is more irritable and he tends to get stressed and anxious. Also having some difficulty with late insomnia His wife wanted to increase his dose     LABS:  Appointment on 03/14/2014  Component Date Value Ref Range Status  . TSH 03/14/2014 0.29* 0.35 - 4.50 uIU/mL Final  . Glucose, Bld 03/14/2014 115* 70 - 99 mg/dL Final  . Cholesterol 03/14/2014 93  0 - 200 mg/dL Final   ATP III Classification       Desirable:  < 200 mg/dL               Borderline High:  200 - 239 mg/dL  High:  > = 240 mg/dL  . Triglycerides 03/14/2014 96.0  0.0 - 149.0 mg/dL Final   Normal:  <150 mg/dLBorderline High:  150 - 199 mg/dL  . HDL 03/14/2014 37.80* >39.00 mg/dL Final  . VLDL 03/14/2014 19.2  0.0 - 40.0 mg/dL Final  . LDL Cholesterol 03/14/2014 36  0 - 99 mg/dL Final  . Total CHOL/HDL Ratio 03/14/2014 2   Final                  Men          Women1/2 Average Risk     3.4          3.3Average Risk          5.0          4.42X Average Risk          9.6          7.13X Average Risk          15.0          11.0                      . NonHDL 03/14/2014 55.20   Final   NOTE:  Non-HDL goal should be 30 mg/dL higher than patient's LDL goal (i.e. LDL goal of < 70 mg/dL, would have non-HDL goal of < 100 mg/dL)      Medication List       This list is accurate as of: 03/16/14  3:50 PM.  Always use your most recent med list.                amLODipine-benazepril 10-40 MG per capsule  Commonly known as:  LOTREL  Take 1 capsule by mouth daily.     amLODipine-benazepril 10-40 MG per capsule  Commonly known as:  LOTREL  TAKE 1 CAPSULE DAILY     bisoprolol-hydrochlorothiazide 2.5-6.25 MG per tablet  Commonly known as:  ZIAC  TAKE 1 TABLET BY MOUTH ONCE A DAY     levothyroxine 137 MCG tablet  Commonly known as:  SYNTHROID, LEVOTHROID  TAKE 1 TABLET DAILY BEFORE BREAKFAST     metformin 1000 MG (OSM) 24 hr tablet  Commonly known as:  FORTAMET  TAKE 1 TABLET DAILY WITH FOOD     methocarbamol 750 MG tablet  Commonly known as:  ROBAXIN  Take 1 tablet (750 mg total) by mouth 3 (three) times daily.     PRISTIQ 50 MG 24 hr tablet  Generic drug:  desvenlafaxine  TAKE 1 TABLET (50 MG TOTAL) BY MOUTH DAILY.        Past Medical History  Diagnosis Date  . Vitiligo   . Alopecia totalis     Past Surgical History  Procedure Laterality Date  . Carpal tunnel release      Family History  Problem Relation Age of Onset  . Diabetes Father   . Hypertension Father   . Diabetes Paternal Aunt     Social History:  reports that he has never smoked. He does not have any smokeless tobacco history on file. His alcohol and drug histories are not on file.  REVIEW of systems:  Had long-standing vitiligo  Also has alopecia totalis  No history of hyperlipidemia. He is total cholesterol is below 100 but also has a low HDL  Allergies: No Known Allergies   Examination:   BP 118/68  Pulse 88  Temp(Src) 98.3 F (36.8 C) (Oral)  Resp 12  Wt 223  lb (101.152 kg)  SpO2 95%    Assessments/PLAN:   Depression and anxiety: He has more anxiety symptoms and will try him on BuSpar. Discussed how this works, possible side effects, dosage titration, and long-term usage. He will continue Pristiq. If he has difficulty with continued anxiety or depression may consider psychiatric consultation He does not want a sleeping medication  currently and his insomnia may improve with relief of anxiety and stress  Hypothyroidism treated with 137 mcg generic thyroxine; his TSH is still slightly low even with reduced dose of 0.137 mg Will change him to the brand name medication as he had previously had TSH levels fairly consistent with the same dose Clinically he is doing well except for some anxiety Is compliant with the medication We'll change him to 125 mcg  DIABETES: Was diagnosed on glucose tolerance test and he is relatively better with his lifestyle  Although A1c has been consistently normal his fasting blood sugar fluctuates and is now relatively high Will check his A1c on the next visit Hopefully he will be consistent with his exercise and lose some weight, otherwise consider increasing metformin  Hypertension: Well controlled  Counseling time over 50% of today's 25 minute visit  [place] 03/16/2014, 3:50 PM

## 2014-03-16 NOTE — Patient Instructions (Signed)
Synthroid 137, take one daily except 1x per week take 1/2  Buspar, with food, 1/2 twice daily for 3-4 days then 1, 2x daily

## 2014-04-25 ENCOUNTER — Other Ambulatory Visit: Payer: Self-pay | Admitting: Endocrinology

## 2014-06-09 ENCOUNTER — Other Ambulatory Visit: Payer: Self-pay | Admitting: Endocrinology

## 2014-06-12 ENCOUNTER — Other Ambulatory Visit: Payer: BC Managed Care – PPO

## 2014-06-15 ENCOUNTER — Ambulatory Visit: Payer: BC Managed Care – PPO | Admitting: Endocrinology

## 2014-06-15 ENCOUNTER — Encounter: Payer: Self-pay | Admitting: *Deleted

## 2014-06-18 ENCOUNTER — Other Ambulatory Visit: Payer: Self-pay | Admitting: Endocrinology

## 2014-06-27 ENCOUNTER — Other Ambulatory Visit: Payer: Self-pay | Admitting: Endocrinology

## 2014-07-16 ENCOUNTER — Other Ambulatory Visit: Payer: BC Managed Care – PPO

## 2014-07-17 ENCOUNTER — Other Ambulatory Visit (INDEPENDENT_AMBULATORY_CARE_PROVIDER_SITE_OTHER): Payer: BLUE CROSS/BLUE SHIELD

## 2014-07-17 DIAGNOSIS — E039 Hypothyroidism, unspecified: Secondary | ICD-10-CM

## 2014-07-17 DIAGNOSIS — E119 Type 2 diabetes mellitus without complications: Secondary | ICD-10-CM

## 2014-07-17 LAB — COMPREHENSIVE METABOLIC PANEL
ALT: 58 U/L — AB (ref 0–53)
AST: 46 U/L — AB (ref 0–37)
Albumin: 4.7 g/dL (ref 3.5–5.2)
Alkaline Phosphatase: 45 U/L (ref 39–117)
BILIRUBIN TOTAL: 1.2 mg/dL (ref 0.2–1.2)
BUN: 12 mg/dL (ref 6–23)
CO2: 27 mEq/L (ref 19–32)
Calcium: 9.4 mg/dL (ref 8.4–10.5)
Chloride: 101 mEq/L (ref 96–112)
Creatinine, Ser: 0.8 mg/dL (ref 0.4–1.5)
GFR: 113.43 mL/min (ref >60.00)
Glucose, Bld: 129 mg/dL — ABNORMAL HIGH (ref 70–99)
Potassium: 3.8 mEq/L (ref 3.5–5.1)
Sodium: 135 mEq/L (ref 135–145)
Total Protein: 7.2 g/dL (ref 6.0–8.3)

## 2014-07-17 LAB — HEMOGLOBIN A1C: HEMOGLOBIN A1C: 5.6 % (ref 4.6–6.5)

## 2014-07-17 LAB — TSH: TSH: 13.9 u[IU]/mL — AB (ref 0.35–4.50)

## 2014-07-20 ENCOUNTER — Encounter: Payer: Self-pay | Admitting: Endocrinology

## 2014-07-20 ENCOUNTER — Ambulatory Visit (INDEPENDENT_AMBULATORY_CARE_PROVIDER_SITE_OTHER): Payer: BLUE CROSS/BLUE SHIELD | Admitting: Endocrinology

## 2014-07-20 VITALS — BP 133/84 | HR 82 | Temp 98.0°F | Resp 14 | Ht 70.0 in | Wt 229.6 lb

## 2014-07-20 DIAGNOSIS — R945 Abnormal results of liver function studies: Secondary | ICD-10-CM

## 2014-07-20 DIAGNOSIS — M62830 Muscle spasm of back: Secondary | ICD-10-CM

## 2014-07-20 DIAGNOSIS — E038 Other specified hypothyroidism: Secondary | ICD-10-CM

## 2014-07-20 DIAGNOSIS — E063 Autoimmune thyroiditis: Secondary | ICD-10-CM

## 2014-07-20 DIAGNOSIS — F329 Major depressive disorder, single episode, unspecified: Secondary | ICD-10-CM

## 2014-07-20 DIAGNOSIS — R7989 Other specified abnormal findings of blood chemistry: Secondary | ICD-10-CM

## 2014-07-20 DIAGNOSIS — F32A Depression, unspecified: Secondary | ICD-10-CM

## 2014-07-20 DIAGNOSIS — E119 Type 2 diabetes mellitus without complications: Secondary | ICD-10-CM

## 2014-07-20 DIAGNOSIS — I10 Essential (primary) hypertension: Secondary | ICD-10-CM

## 2014-07-20 MED ORDER — SYNTHROID 150 MCG PO TABS: 150.0000 ug | ORAL_TABLET | Freq: Every day | ORAL | Status: DC

## 2014-07-20 NOTE — Patient Instructions (Addendum)
Pristiq 2 daily and call if new Rx needed  Increased thyroid med  Try Alleve instead of Motrin

## 2014-07-20 NOTE — Progress Notes (Signed)
Patient ID: Mark Black, male   DOB: [birth year], 49 y.o.   MRN: [id number]   Reason for Appointment:   Follow up for various problems    History of Present Illness:   DIABETES: This has been mild and diagnosed on his glucose tolerance test  GTT in 12/13 showed FASTING glucose of 121, two-hour reading of 245. He has a family history of diabetes and his fasting glucose has been high since 2011 when it was 109.  Subsequently fasting readings: 119 in 4/12, 113 in 3/13.   He was started on metformin ER 1000 mg after the GTT results   He has been advised to increase his efforts to lose weight and is variably watching his diet; usually avoiding fast food.  However his compliance with this has been variable over the last couple of years  Had not been able to lose weight consistently Exercising on and off, less recently because of back pain.  He says he is active in playing softball during the season, to start in March  Has been variably active with construction work    FASTING blood sugar not checked, previously range 101-115.  Nonfasting glucose 129  Wt Readings from Last 3 Encounters:  07/20/14 229 lb 9.6 oz (104.146 kg)  03/16/14 223 lb (101.152 kg)  12/14/13 220 lb 3.2 oz (99.882 kg)    Lab Results  Component Value Date   HGBA1C 5.6 07/17/2014   HGBA1C 5.3 12/11/2013   HGBA1C 5.4 06/06/2013   Lab Results  Component Value Date   LDLCALC 36 03/14/2014   CREATININE 0.8 07/17/2014    Hypertension:    He has had long-standing hypertension  Does not monitor his blood pressure at home   He has been on low dose Ziac since 2013 in addition to maximum dose Lotrel for better control.  Also had a relatively high pulse rate of 80-90 previously.  Blood pressure is high normal today, was higher on the first measurement.   Hypothyroidism:   The hypothyroidism was first diagnosed In 1996 with weight gain and feeling sluggish   The treatments that the patient has taken include Synthroid  brand name and for some time has been on generic given by his [place].        The response to therapy has been  good with normal energy level        Compliance with the medical regimen has been as prescribed with taking the tablet in the morning before breakfast.  TSH on the last visit  was relatively low and his dose was reduced to 125 g However now TSH is significantly high and he is also complaining of feeling sluggish and tired.   Lab Results  Component Value Date   FREET4 1.13 12/11/2013   FREET4 1.27 06/06/2013   TSH 13.90* 07/17/2014   TSH 0.29* 03/14/2014   TSH 0.10* 12/11/2013    DEPRESSION: Has history of depression, he has been on Pristiq for sometime On his last visit he was more irritable and he tends to get stressed and anxious. Also having some difficulty with late insomnia He was tried on BuSpar which only helped a little and he forgets to take the evening dose Still not feeling back to normal     LABS:  Appointment on 07/17/2014  Component Date Value Ref Range Status  . Hgb A1c MFr Bld 07/17/2014 5.6  4.6 - 6.5 % Final   Glycemic Control Guidelines for People with Diabetes:Non Diabetic:  <6%Goal of Therapy: <7%Additional  Action Suggested:  >8%   . Sodium 07/17/2014 135  135 - 145 mEq/L Final  . Potassium 07/17/2014 3.8  3.5 - 5.1 mEq/L Final  . Chloride 07/17/2014 101  96 - 112 mEq/L Final  . CO2 07/17/2014 27  19 - 32 mEq/L Final  . Glucose, Bld 07/17/2014 129* 70 - 99 mg/dL Final  . BUN 07/17/2014 12  6 - 23 mg/dL Final  . Creatinine, Ser 07/17/2014 0.8  0.4 - 1.5 mg/dL Final  . Total Bilirubin 07/17/2014 1.2  0.2 - 1.2 mg/dL Final  . Alkaline Phosphatase 07/17/2014 45  39 - 117 U/L Final  . AST 07/17/2014 46* 0 - 37 U/L Final  . ALT 07/17/2014 58* 0 - 53 U/L Final  . Total Protein 07/17/2014 7.2  6.0 - 8.3 g/dL Final  . Albumin 07/17/2014 4.7  3.5 - 5.2 g/dL Final  . Calcium 07/17/2014 9.4  8.4 - 10.5 mg/dL Final  . GFR 07/17/2014 113.43   >60.00 mL/min Final  . TSH 07/17/2014 13.90* 0.35 - 4.50 uIU/mL Final      Medication List       This list is accurate as of: 07/20/14  3:28 PM.  Always use your most recent med list.               amLODipine-benazepril 10-40 MG per capsule  Commonly known as:  LOTREL  TAKE 1 CAPSULE DAILY     bisoprolol-hydrochlorothiazide 2.5-6.25 MG per tablet  Commonly known as:  ZIAC  TAKE 1 TABLET DAILY     busPIRone 15 MG tablet  Commonly known as:  BUSPAR  TAKE 1 TABLET (15 MG TOTAL) BY MOUTH 2 (TWO) TIMES DAILY.     metformin 1000 MG (OSM) 24 hr tablet  Commonly known as:  FORTAMET  TAKE 1 TABLET DAILY WITH FOOD     methocarbamol 750 MG tablet  Commonly known as:  ROBAXIN  Take 1 tablet (750 mg total) by mouth 3 (three) times daily.     PRISTIQ 50 MG 24 hr tablet  Generic drug:  desvenlafaxine  TAKE 1 TABLET (50 MG TOTAL) BY MOUTH DAILY.     SYNTHROID 125 MCG tablet  Generic drug:  levothyroxine  Take 1 tablet (125 mcg total) by mouth daily before breakfast.     vitamin B-12 1000 MCG tablet  Commonly known as:  CYANOCOBALAMIN  Take 1,000 mcg by mouth daily.        Past Medical History  Diagnosis Date  . Vitiligo   . Alopecia totalis     Past Surgical History  Procedure Laterality Date  . Carpal tunnel release      Family History  Problem Relation Age of Onset  . Diabetes Father   . Hypertension Father   . Diabetes Paternal Aunt     Social History:  reports that he has never smoked. He does not have any smokeless tobacco history on file. His alcohol and drug histories are not on file.   Allergies: No Known Allergies  REVIEW of systems:  Is complaining of on and off back pain recently.  His job involves a lot of bending and lifting and pushing He normally goes to the chiropractor with relief of symptoms. Has been taking 2-3 Advil one or 2 times a day regularly recently  His liver functions are abnormal which is new, no excessive alcohol intake  Had  long-standing vitiligo which is generalized  Also has alopecia totalis but he says now that he is getting some more  hair on his forearms and face  No history of hyperlipidemia. He is total cholesterol is below 100 but also has a low HDL    Examination:   BP 134/94 mmHg  Pulse 82  Temp(Src) 98 F (36.7 C)  Resp 14  Ht 5\' 10"  (1.778 m)  Wt 229 lb 9.6 oz (104.146 kg)  BMI 32.94 kg/m2  SpO2 97%  Repeat blood pressure 133/84 Biceps reflexes appear normal No peripheral edema Has alopecia totalis    Assessments/PLAN:   Hypothyroidism treated with 125 mcg generic thyroxine; his TSH has been inconsistent more recently Even though it was reduced only slightly when his TSH was relatively low on his last visit his TSH is almost 14 now and he is having some hypothyroid symptoms also Is compliant with the medication Will change him to 150 mcg and continue brand name Synthroid  Abnormal liver function studies: Most likely this is from regular use of ibuprofen. He will try to minimize this and use Aleve instead; also he can follow-up with his chiropractor for treatment of his low back pain He will also try to use muscle relaxant at the same time to reduce need for analgesics  Depression and anxiety: He has more anxiety symptoms and some continued depression and late insomnia He did not improve with BuSpar He agrees to try 100 mg of Pristiq.  Again did not want a hypnotic medication If he has difficulty with continued anxiety or depression may consider psychiatric consultation  DIABETES: Was diagnosed on glucose tolerance test and he is relatively inconsistent with his lifestyle  Although A1c has been consistently normal is slightly higher than before Fasting glucose not checked He thinks he can be more active in springtime Encouraged him to be more consistent with diet also for weight loss May consider increasing metformin and fasting glucose significantly high  Hypertension: Blood  pressure is upper normal, may improve with some weight loss and regular exercise.  Will continue same medications for now  Total visit time including review of all problems, counseling and Labs = 25 minutes  Manasvi Dickard 07/20/2014, 3:28 PM

## 2014-08-09 ENCOUNTER — Telehealth: Payer: Self-pay | Admitting: Endocrinology

## 2014-08-09 ENCOUNTER — Other Ambulatory Visit: Payer: Self-pay | Admitting: *Deleted

## 2014-08-09 MED ORDER — DESVENLAFAXINE SUCCINATE ER 50 MG PO TB24: ORAL_TABLET | ORAL | Status: DC

## 2014-08-09 NOTE — Telephone Encounter (Signed)
rx sent

## 2014-08-09 NOTE — Telephone Encounter (Signed)
Can we call in a rx for the pt to get a quantity of 60 for 30 days of his prestique

## 2014-08-27 ENCOUNTER — Telehealth: Payer: Self-pay | Admitting: Endocrinology

## 2014-08-27 ENCOUNTER — Other Ambulatory Visit: Payer: Self-pay | Admitting: *Deleted

## 2014-08-27 MED ORDER — DESVENLAFAXINE SUCCINATE ER 50 MG PO TB24: ORAL_TABLET | ORAL | Status: DC

## 2014-08-27 NOTE — Telephone Encounter (Signed)
Patient called stating his Rx is needing to be sent to his pharmacy the correct way  Rx: Pristiq 50 mg 1 tablet 2 x daily    Please advise   Thank you

## 2014-08-27 NOTE — Telephone Encounter (Signed)
DONE

## 2014-08-28 ENCOUNTER — Telehealth: Payer: Self-pay | Admitting: *Deleted

## 2014-08-28 NOTE — Telephone Encounter (Signed)
Pharmacy called stating the pt's ins will not cover 50 mg 2 times daily, but will cover 100 mg 1 time daily. Ok'd to change.

## 2014-09-13 ENCOUNTER — Other Ambulatory Visit: Payer: Self-pay | Admitting: Endocrinology

## 2014-10-16 ENCOUNTER — Other Ambulatory Visit (INDEPENDENT_AMBULATORY_CARE_PROVIDER_SITE_OTHER): Payer: BLUE CROSS/BLUE SHIELD

## 2014-10-16 DIAGNOSIS — E038 Other specified hypothyroidism: Secondary | ICD-10-CM

## 2014-10-16 DIAGNOSIS — E063 Autoimmune thyroiditis: Secondary | ICD-10-CM

## 2014-10-16 DIAGNOSIS — E119 Type 2 diabetes mellitus without complications: Secondary | ICD-10-CM

## 2014-10-16 LAB — COMPREHENSIVE METABOLIC PANEL
ALT: 60 U/L — AB (ref 0–53)
AST: 45 U/L — ABNORMAL HIGH (ref 0–37)
Albumin: 4.5 g/dL (ref 3.5–5.2)
Alkaline Phosphatase: 52 U/L (ref 39–117)
BUN: 14 mg/dL (ref 6–23)
CO2: 26 meq/L (ref 19–32)
Calcium: 9.7 mg/dL (ref 8.4–10.5)
Chloride: 102 mEq/L (ref 96–112)
Creatinine, Ser: 0.76 mg/dL (ref 0.40–1.50)
GFR: 115.03 mL/min (ref >60.00)
GLUCOSE: 119 mg/dL — AB (ref 70–99)
POTASSIUM: 4.3 meq/L (ref 3.5–5.1)
SODIUM: 137 meq/L (ref 135–145)
TOTAL PROTEIN: 7.1 g/dL (ref 6.0–8.3)
Total Bilirubin: 0.9 mg/dL (ref 0.2–1.2)

## 2014-10-16 LAB — TSH: TSH: 4.43 u[IU]/mL (ref 0.35–4.50)

## 2014-10-19 ENCOUNTER — Encounter: Payer: Self-pay | Admitting: Endocrinology

## 2014-10-19 ENCOUNTER — Ambulatory Visit (INDEPENDENT_AMBULATORY_CARE_PROVIDER_SITE_OTHER): Payer: BLUE CROSS/BLUE SHIELD | Admitting: Endocrinology

## 2014-10-19 VITALS — BP 125/80 | HR 89 | Temp 99.0°F | Wt 226.0 lb

## 2014-10-19 DIAGNOSIS — E038 Other specified hypothyroidism: Secondary | ICD-10-CM | POA: Diagnosis not present

## 2014-10-19 DIAGNOSIS — E119 Type 2 diabetes mellitus without complications: Secondary | ICD-10-CM

## 2014-10-19 DIAGNOSIS — I10 Essential (primary) hypertension: Secondary | ICD-10-CM

## 2014-10-19 DIAGNOSIS — R7989 Other specified abnormal findings of blood chemistry: Secondary | ICD-10-CM | POA: Diagnosis not present

## 2014-10-19 DIAGNOSIS — E063 Autoimmune thyroiditis: Secondary | ICD-10-CM

## 2014-10-19 DIAGNOSIS — R945 Abnormal results of liver function studies: Secondary | ICD-10-CM

## 2014-10-19 MED ORDER — METFORMIN HCL ER (OSM) 1000 MG PO TB24: 2000.0000 mg | ORAL_TABLET | Freq: Every day | ORAL | Status: DC

## 2014-10-19 NOTE — Progress Notes (Signed)
Patient ID: Mark Black, male   DOB: [birth year], 49 y.o.   MRN: [id number]   Reason for Appointment:   Follow up for various problems    History of Present Illness:   DIABETES: This has been mild and diagnosed on his glucose tolerance test  GTT in 12/13 showed FASTING glucose of 121, two-hour reading of 245. He has a family history of diabetes and his fasting glucose has been high since 2011 when it was 109.   He was started on metformin ER 1000 mg after the GTT results   He has been advised to be consistent in his efforts to lose weight and is generally watching his diet; usually avoiding fast food.  However his compliance with this has been variable over the last couple of years  Had not been able to lose weight consistently Exercising on and off.  He says he is active in playing softball during the season, to start  soon  Has been variably active with construction work    FASTING blood sugar is 119;  previously range 101-115.  Nonfasting glucose previously 129  Wt Readings from Last 3 Encounters:  10/19/14 226 lb (102.513 kg)  07/20/14 229 lb 9.6 oz (104.146 kg)  03/16/14 223 lb (101.152 kg)    Lab Results  Component Value Date   HGBA1C 5.6 07/17/2014   HGBA1C 5.3 12/11/2013   HGBA1C 5.4 06/06/2013   Lab Results  Component Value Date   LDLCALC 36 03/14/2014   CREATININE 0.76 10/16/2014    Hypertension:    He has had long-standing hypertension  Does not monitor his blood pressure at home   He has been on low dose Ziac since 2013 in addition to maximum dose Lotrel for better control.  Also had a relatively high pulse rate of 80-90 previously.  Blood pressure is  normal today  Hypothyroidism:   The hypothyroidism was first diagnosed In 1996 with weight gain and feeling sluggish   The treatments that the patient has taken include Synthroid brand name and  more recently has been on generic given by his [place].       Compliance with the medical regimen has  been as prescribed with taking the tablet in the morning before breakfast.  TSH on the last visit was markedly increased at almost 14 and he is now taking 150 g  The response to therapy has been  good with normal energy level       TSH is back to normal now   Lab Results  Component Value Date   FREET4 1.13 12/11/2013   FREET4 1.27 06/06/2013   TSH 4.43 10/16/2014   TSH 13.90* 07/17/2014   TSH 0.29* 03/14/2014    DEPRESSION: Has history of depression, he has been on Pristiq for sometime On his last visit he was more irritable and he tends to get stressed and anxious. Also was having some difficulty with late insomnia With increasing the Pristiq to 100 mg his symptoms have resolved and he is tolerating this well    LABS:  Lab on 10/16/2014  Component Date Value Ref Range Status  . Sodium 10/16/2014 137  135 - 145 mEq/L Final  . Potassium 10/16/2014 4.3  3.5 - 5.1 mEq/L Final  . Chloride 10/16/2014 102  96 - 112 mEq/L Final  . CO2 10/16/2014 26  19 - 32 mEq/L Final  . Glucose, Bld 10/16/2014 119* 70 - 99 mg/dL Final  . BUN 10/16/2014 14  6 - 23 mg/dL Final  .  Creatinine, Ser 10/16/2014 0.76  0.40 - 1.50 mg/dL Final  . Total Bilirubin 10/16/2014 0.9  0.2 - 1.2 mg/dL Final  . Alkaline Phosphatase 10/16/2014 52  39 - 117 U/L Final  . AST 10/16/2014 45* 0 - 37 U/L Final  . ALT 10/16/2014 60* 0 - 53 U/L Final  . Total Protein 10/16/2014 7.1  6.0 - 8.3 g/dL Final  . Albumin 10/16/2014 4.5  3.5 - 5.2 g/dL Final  . Calcium 10/16/2014 9.7  8.4 - 10.5 mg/dL Final  . GFR 10/16/2014 115.03  >60.00 mL/min Final  . TSH 10/16/2014 4.43  0.35 - 4.50 uIU/mL Final      Medication List       This list is accurate as of: 10/19/14  4:19 PM.  Always use your most recent med list.               amLODipine-benazepril 10-40 MG per capsule  Commonly known as:  LOTREL  TAKE 1 CAPSULE DAILY     bisoprolol-hydrochlorothiazide 2.5-6.25 MG per tablet  Commonly known as:  ZIAC  TAKE 1 TABLET  DAILY     busPIRone 15 MG tablet  Commonly known as:  BUSPAR  TAKE 1 TABLET (15 MG TOTAL) BY MOUTH 2 (TWO) TIMES DAILY.     desvenlafaxine 50 MG 24 hr tablet  Commonly known as:  PRISTIQ  TAKE 1 TABLET (50 MG TOTAL) BY MOUTH 2 TIMES DAILY.     PRISTIQ 100 MG 24 hr tablet  Generic drug:  desvenlafaxine  Take 100 mg by mouth daily.     metformin 1000 MG (OSM) 24 hr tablet  Commonly known as:  FORTAMET  TAKE 1 TABLET DAILY WITH FOOD     methocarbamol 750 MG tablet  Commonly known as:  ROBAXIN  Take 1 tablet (750 mg total) by mouth 3 (three) times daily.     SYNTHROID 150 MCG tablet  Generic drug:  levothyroxine  Take 1 tablet (150 mcg total) by mouth daily before breakfast.     vitamin B-12 1000 MCG tablet  Commonly known as:  CYANOCOBALAMIN  Take 1,000 mcg by mouth daily.        Past Medical History  Diagnosis Date  . Vitiligo   . Alopecia totalis     Past Surgical History  Procedure Laterality Date  . Carpal tunnel release      Family History  Problem Relation Age of Onset  . Diabetes Father   . Hypertension Father   . Diabetes Paternal Aunt     Social History:  reports that he has never smoked. He does not have any smokeless tobacco history on file. His alcohol and drug histories are not on file.   Allergies: No Known Allergies  REVIEW of systems:  Is complaining of on and off back pain   He normally goes to the chiropractor with relief of symptoms.  His liver functions are abnormal which is persistent, previously was taking Advil more regularly and this was stopped No taking Aleve qod, 2-4 tablets on any given day for his back pain  Had long-standing vitiligo which is generalized  Also has alopecia totalis except some on his forearms and face  No history of hyperlipidemia. He is total cholesterol is below 100 but also has a low HDL    Examination:   BP 125/80 mmHg  Pulse 89  Temp(Src) 99 F (37.2 C)  Wt 226 lb (102.513 kg)  SpO2 99%        Assessments/PLAN:  DIABETES: Was diagnosed on glucose tolerance test and he is recently starting to be more active  Although A1c has been consistently normal his fasting reading is still 119 Since he may have fatty liver also will increase his metformin to 2000 mg Again emphasized the need for weight loss  HYPOTHYROIDISM treated with 114mcg generic thyroxine;  with increasing the dose on his last visit he has felt better and his TSH is back to normal He will continue brand name Synthroid  Abnormal liver function studies:   Etiology of this is unclear as he is not taking a lot of nonsteroidal drugs and has stopped ibuprofen He may well have some fatty liver and may improve with weight loss and increasing metformin Consider GI consultation if not better  Depression and anxiety: He has less anxiety symptoms and depressive symptoms are also better with increasing his Pristiq to 100 mg He will continue this since he is tolerating this well also  Hypertension: Blood pressure is better controlled now   [facility] 10/19/2014, 4:19 PM

## 2014-10-19 NOTE — Progress Notes (Signed)
Pre visit review using our clinic review tool, if applicable. No additional management support is needed unless otherwise documented below in the visit note. 

## 2014-10-31 ENCOUNTER — Telehealth: Payer: Self-pay | Admitting: Endocrinology

## 2014-10-31 ENCOUNTER — Other Ambulatory Visit: Payer: Self-pay | Admitting: *Deleted

## 2014-10-31 MED ORDER — SYNTHROID 150 MCG PO TABS: 150.0000 ug | ORAL_TABLET | Freq: Every day | ORAL | Status: DC

## 2014-10-31 NOTE — Telephone Encounter (Signed)
Please see below.

## 2014-10-31 NOTE — Telephone Encounter (Signed)
Wrong patient

## 2014-10-31 NOTE — Telephone Encounter (Signed)
rx sent

## 2014-10-31 NOTE — Telephone Encounter (Signed)
Patient called and would like a refill of her medication sent to her pharmacy  Rx: Novolog and Lantus vials   Pharmacy: [facility] on [address]    Thank you

## 2014-10-31 NOTE — Telephone Encounter (Signed)
Patient called and would like a refill on his synthroid as his dog ate them   [facility]   Thank you

## 2014-11-22 ENCOUNTER — Other Ambulatory Visit: Payer: Self-pay | Admitting: Endocrinology

## 2014-11-24 ENCOUNTER — Other Ambulatory Visit: Payer: Self-pay | Admitting: Endocrinology

## 2014-11-28 ENCOUNTER — Telehealth: Payer: Self-pay | Admitting: Endocrinology

## 2014-11-28 NOTE — Telephone Encounter (Signed)
Patient need a refill for Pristiq 100 mg [facility]

## 2014-12-12 ENCOUNTER — Telehealth: Payer: Self-pay | Admitting: Endocrinology

## 2014-12-12 MED ORDER — METHOCARBAMOL 750 MG PO TABS: 750.0000 mg | ORAL_TABLET | Freq: Three times a day (TID) | ORAL | Status: DC

## 2014-12-12 NOTE — Telephone Encounter (Signed)
Noted patient is aware 

## 2014-12-12 NOTE — Telephone Encounter (Signed)
Patient called, he said since he's increased his metformin he's had severe diarrhea, is very dizzy in the morning and very jittery. Please advise if he can go back to just one pill a day.

## 2014-12-12 NOTE — Telephone Encounter (Signed)
Patient called and would like a refill on his medication   Rx: Methocarbamol   [facility]   Please call patient as he requested he needed to speak with Dr. Ronnie Derby nurse.  Call back: [phone]  Thank you

## 2014-12-12 NOTE — Telephone Encounter (Signed)
ok 

## 2015-01-14 ENCOUNTER — Other Ambulatory Visit: Payer: Self-pay | Admitting: Endocrinology

## 2015-01-15 ENCOUNTER — Other Ambulatory Visit (INDEPENDENT_AMBULATORY_CARE_PROVIDER_SITE_OTHER): Payer: BLUE CROSS/BLUE SHIELD

## 2015-01-15 DIAGNOSIS — E063 Autoimmune thyroiditis: Secondary | ICD-10-CM

## 2015-01-15 DIAGNOSIS — E119 Type 2 diabetes mellitus without complications: Secondary | ICD-10-CM | POA: Diagnosis not present

## 2015-01-15 LAB — COMPREHENSIVE METABOLIC PANEL
ALT: 45 U/L (ref 0–53)
AST: 29 U/L (ref 0–37)
Albumin: 4.2 g/dL (ref 3.5–5.2)
Alkaline Phosphatase: 45 U/L (ref 39–117)
BUN: 12 mg/dL (ref 6–23)
CALCIUM: 9.6 mg/dL (ref 8.4–10.5)
CHLORIDE: 103 meq/L (ref 96–112)
CO2: 28 mEq/L (ref 19–32)
Creatinine, Ser: 0.69 mg/dL (ref 0.40–1.50)
GFR: 128.48 mL/min (ref >60.00)
Glucose, Bld: 100 mg/dL — ABNORMAL HIGH (ref 70–99)
POTASSIUM: 4 meq/L (ref 3.5–5.1)
Sodium: 140 mEq/L (ref 135–145)
TOTAL PROTEIN: 6.9 g/dL (ref 6.0–8.3)
Total Bilirubin: 0.5 mg/dL (ref 0.2–1.2)

## 2015-01-15 LAB — POCT URINALYSIS DIPSTICK
Bilirubin, UA: NEGATIVE
Glucose, UA: NEGATIVE
Ketones, UA: NEGATIVE
Leukocytes, UA: NEGATIVE
NITRITE UA: NEGATIVE
PH UA: 6.5
RBC UA: NEGATIVE
Spec Grav, UA: 1.015
Urobilinogen, UA: 0.2

## 2015-01-15 LAB — TSH: TSH: 2.28 u[IU]/mL (ref 0.35–4.50)

## 2015-01-15 LAB — MICROALBUMIN / CREATININE URINE RATIO
Creatinine,U: 133.9 mg/dL
Microalb Creat Ratio: 0.5 mg/g (ref 0.0–30.0)
Microalb, Ur: <0.7 mg/dL (ref 0.0–1.9)

## 2015-01-15 LAB — HEMOGLOBIN A1C: Hgb A1c MFr Bld: 5 % (ref 4.6–6.5)

## 2015-01-18 ENCOUNTER — Encounter: Payer: Self-pay | Admitting: Endocrinology

## 2015-01-18 ENCOUNTER — Ambulatory Visit (INDEPENDENT_AMBULATORY_CARE_PROVIDER_SITE_OTHER): Payer: BLUE CROSS/BLUE SHIELD | Admitting: Endocrinology

## 2015-01-18 VITALS — BP 142/92 | HR 80 | Temp 98.4°F | Resp 16 | Ht 70.0 in | Wt 225.0 lb

## 2015-01-18 DIAGNOSIS — E119 Type 2 diabetes mellitus without complications: Secondary | ICD-10-CM | POA: Diagnosis not present

## 2015-01-18 DIAGNOSIS — R7989 Other specified abnormal findings of blood chemistry: Secondary | ICD-10-CM | POA: Diagnosis not present

## 2015-01-18 DIAGNOSIS — E038 Other specified hypothyroidism: Secondary | ICD-10-CM

## 2015-01-18 DIAGNOSIS — E063 Autoimmune thyroiditis: Secondary | ICD-10-CM

## 2015-01-18 DIAGNOSIS — R945 Abnormal results of liver function studies: Secondary | ICD-10-CM

## 2015-01-18 DIAGNOSIS — I10 Essential (primary) hypertension: Secondary | ICD-10-CM

## 2015-01-18 NOTE — Progress Notes (Signed)
Patient ID: Verna Czech, male   DOB: 1965-12-01, 49 y.o.   MRN: [id number]   Reason for Appointment:   Follow up for various problems    History of Present Illness:   DIABETES: This has been mild and diagnosed on his glucose tolerance test He has a family history of diabetes and his fasting glucose has been high since 2011 when it was 109.   GTT in 12/13 showed FASTING glucose of 121, two-hour reading of 245. He was started on metformin ER 1000 mg after the GTT results    He has been advised to be consistent in his efforts to lose weight and is generally watching his diet; usually avoiding fast food.  However his compliance with this has been variable over the last couple of years  Had not been able to lose weight recently Also because of his impaired liver function he was told to try 2000 mg of metformin ER but this caused diarrhea.  He still has mild diarrhea in the morning after taking his medication in the evening  Exercising some.  He says he is active in playing softball recently.  Has not had the time to exercise because of work  FASTING blood sugar is now improved at 100; previously range 101-119.   A1c is 6 excellent at 5%   Wt Readings from Last 3 Encounters:  01/18/15 225 lb (102.059 kg)  10/19/14 226 lb (102.513 kg)  07/20/14 229 lb 9.6 oz (104.146 kg)    Lab Results  Component Value Date   HGBA1C 5.0 01/15/2015   HGBA1C 5.6 07/17/2014   HGBA1C 5.3 12/11/2013   Lab Results  Component Value Date   MICROALBUR <0.7 01/15/2015   [place] 36 03/14/2014   CREATININE 0.69 01/15/2015    Hypertension:    He has had long-standing hypertension  Does not monitor his blood pressure at home   He has been on low dose Ziac since 2013 in addition to maximum dose Lotrel for better control.   Blood pressure is relatively high today even on second attempt   Hypothyroidism:   The hypothyroidism was first diagnosed In 1996 with weight gain and feeling sluggish    The treatments that the patient has taken include Synthroid brand name and  more recently has been on generic given by his [place].       Compliance with the medical regimen has been as prescribed with taking the tablet in the morning before breakfast.  TSH in 1/16 was markedly increased at almost 14 and he is now taking 150 g  The response to therapy has been  good with normal energy level        TSH is back to normal now consistently   Lab Results  Component Value Date   TSH 2.28 01/15/2015   TSH 4.43 10/16/2014   TSH 13.90* 07/17/2014   FREET4 1.13 12/11/2013   FREET4 1.27 06/06/2013     DEPRESSION: Has history of depression, he has been on Pristiq for sometime Symptoms include getting irritable and he tends to get stressed and anxious. Also was having some difficulty with late insomnia With increasing the Pristiq to 100 mg his symptoms have resolved and he is tolerating this well    LABS:  Lab on 01/15/2015  Component Date Value Ref Range Status  . Microalb, Ur 01/15/2015 <0.7  0.0 - 1.9 mg/dL Final  . Creatinine,U 01/15/2015 133.9   Final  . Microalb Creat Ratio 01/15/2015 0.5  0.0 - 30.0  mg/g Final  . Color, UA 01/15/2015 Yellow   Final  . Clarity, UA 01/15/2015 Clear   Final  . Glucose, UA 01/15/2015 Neg   Final  . Bilirubin, UA 01/15/2015 Neg   Final  . Ketones, UA 01/15/2015 Neg   Final  . Spec Grav, UA 01/15/2015 1.015   Final  . Blood, UA 01/15/2015 Neg   Final  . pH, UA 01/15/2015 6.5   Final  . Protein, UA 01/15/2015 Trace   Final  . Urobilinogen, UA 01/15/2015 0.2   Final  . Nitrite, UA 01/15/2015 Neg   Final  . Leukocytes, UA 01/15/2015 Negative  Negative Final  . Hgb A1c MFr Bld 01/15/2015 5.0  4.6 - 6.5 % Final   Glycemic Control Guidelines for People with Diabetes:Non Diabetic:  <6%Goal of Therapy: <7%Additional Action Suggested:  >8%   . Sodium 01/15/2015 140  135 - 145 mEq/L Final  . Potassium 01/15/2015 4.0  3.5 - 5.1 mEq/L Final  .  Chloride 01/15/2015 103  96 - 112 mEq/L Final  . CO2 01/15/2015 28  19 - 32 mEq/L Final  . Glucose, Bld 01/15/2015 100* 70 - 99 mg/dL Final  . BUN 01/15/2015 12  6 - 23 mg/dL Final  . Creatinine, Ser 01/15/2015 0.69  0.40 - 1.50 mg/dL Final  . Total Bilirubin 01/15/2015 0.5  0.2 - 1.2 mg/dL Final  . Alkaline Phosphatase 01/15/2015 45  39 - 117 U/L Final  . AST 01/15/2015 29  0 - 37 U/L Final  . ALT 01/15/2015 45  0 - 53 U/L Final  . Total Protein 01/15/2015 6.9  6.0 - 8.3 g/dL Final  . Albumin 01/15/2015 4.2  3.5 - 5.2 g/dL Final  . Calcium 01/15/2015 9.6  8.4 - 10.5 mg/dL Final  . GFR 01/15/2015 128.48  >60.00 mL/min Final  . TSH 01/15/2015 2.28  0.35 - 4.50 uIU/mL Final      Medication List       This list is accurate as of: 01/18/15 11:59 PM.  Always use your most recent med list.               amLODipine-benazepril 10-40 MG per capsule  Commonly known as:  LOTREL  TAKE 1 CAPSULE DAILY     bisoprolol-hydrochlorothiazide 2.5-6.25 MG per tablet  Commonly known as:  ZIAC  TAKE 1 TABLET DAILY     busPIRone 15 MG tablet  Commonly known as:  BUSPAR  TAKE 1 TABLET (15 MG TOTAL) BY MOUTH 2 (TWO) TIMES DAILY.     metformin 1000 MG (OSM) 24 hr tablet  Commonly known as:  FORTAMET  Take 2 tablets (2,000 mg total) by mouth daily after supper. with food     methocarbamol 750 MG tablet  Commonly known as:  ROBAXIN  Take 1 tablet (750 mg total) by mouth 3 (three) times daily.     PRISTIQ 100 MG 24 hr tablet  Generic drug:  desvenlafaxine  TAKE 1 TABLET EVERY DAY     SYNTHROID 150 MCG tablet  Generic drug:  levothyroxine  TAKE 1 TABLET (150 MCG TOTAL) BY MOUTH DAILY BEFORE BREAKFAST.     vitamin B-12 1000 MCG tablet  Commonly known as:  CYANOCOBALAMIN  Take 1,000 mcg by mouth daily.        Past Medical History  Diagnosis Date  . Vitiligo   . Alopecia totalis     Past Surgical History  Procedure Laterality Date  . Carpal tunnel release      Family  History    Problem Relation Age of Onset  . Diabetes Father   . Hypertension Father   . Diabetes Paternal Aunt     Social History:  reports that he has never smoked. He does not have any smokeless tobacco history on file. His alcohol and drug histories are not on file.   Allergies: No Known Allergies  REVIEW of systems:.  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 He is total cholesterol is below 100 but also has a low HDL    Examination:   BP 142/92 mmHg  Pulse 80  Temp(Src) 98.4 F (36.9 C)  Resp 16  Ht 5\' 10"  (1.778 m)  Wt 225 lb (102.059 kg)  BMI 32.28 kg/m2  SpO2 94%   Repeat blood pressure on either arm: 128/86 and 122/80    Assessments/PLAN:   DIABETES: Was diagnosed on glucose tolerance test and he is on metformin Although A1c has been consistently normal and his fasting reading is improved at 100 Since he cannot tolerate more than 1000 mg of metformin ER he will continue this and emphasized need for consistent diet and exercise to keep his weight coming down  HYPOTHYROIDISM treated with 133mcg generic thyroxine;  with increasing the dose in 1/16 his TSH is consistently normal now He will continue brand name Synthroid  Abnormal liver function studies: Etiology of this is unclear but these are better now Had only taken small amounts of ibuprofen or Aleve previously  Hypertension: Blood pressure is relatively high today except on one measurement and he will start monitoring at home. He will call if blood pressure consistently high   Maizy Davanzo 01/19/2015, 11:55 AM

## 2015-01-18 NOTE — Patient Instructions (Signed)
Check BP weekly 

## 2015-02-06 ENCOUNTER — Other Ambulatory Visit: Payer: Self-pay | Admitting: *Deleted

## 2015-02-06 ENCOUNTER — Telehealth: Payer: Self-pay

## 2015-02-06 MED ORDER — AMLODIPINE BESY-BENAZEPRIL HCL 10-40 MG PO CAPS: 1.0000 | ORAL_CAPSULE | Freq: Every day | ORAL | Status: DC

## 2015-02-06 NOTE — Telephone Encounter (Signed)
rx sent

## 2015-02-06 NOTE — Telephone Encounter (Signed)
Pt called requesting a refill on Lotrel 10-40mg . Pt would like this Rx sent to [facility] for a 90 day supply.

## 2015-03-11 ENCOUNTER — Other Ambulatory Visit: Payer: Self-pay | Admitting: Endocrinology

## 2015-03-13 ENCOUNTER — Telehealth: Payer: Self-pay

## 2015-03-13 NOTE — Telephone Encounter (Signed)
Pt will have B/P rechecked on upcoming f/u visit 9/19/216

## 2015-03-22 ENCOUNTER — Telehealth: Payer: Self-pay | Admitting: Endocrinology

## 2015-03-22 NOTE — Telephone Encounter (Signed)
Pt called and would like nurse to call hm back he's having a allergy attack

## 2015-03-22 NOTE — Telephone Encounter (Signed)
I contacted the pt. Pt stated on Thursday he came in contact with a cat and has been congested ever since. Pt stated he has been taking benadryl and it has not help yet. Pt was advised to take otc nasal decongestant over the weekend and to call the office on Monday if he is not feeling any better.

## 2015-04-17 ENCOUNTER — Other Ambulatory Visit: Payer: Self-pay | Admitting: *Deleted

## 2015-04-17 ENCOUNTER — Telehealth: Payer: Self-pay | Admitting: Endocrinology

## 2015-04-17 MED ORDER — SYNTHROID 150 MCG PO TABS: ORAL_TABLET | ORAL | Status: DC

## 2015-04-17 NOTE — Telephone Encounter (Signed)
rx sent

## 2015-04-17 NOTE — Telephone Encounter (Signed)
[facility] [place] [facility]

## 2015-04-18 ENCOUNTER — Other Ambulatory Visit: Payer: Self-pay | Admitting: Endocrinology

## 2015-04-18 NOTE — Telephone Encounter (Signed)
Patient ask if you could send synthroid refill to,   [id number] - [place], [place] - [address] [phone] (Phone) [phone] (Fax)

## 2015-05-17 ENCOUNTER — Other Ambulatory Visit (INDEPENDENT_AMBULATORY_CARE_PROVIDER_SITE_OTHER): Payer: BLUE CROSS/BLUE SHIELD

## 2015-05-17 DIAGNOSIS — E119 Type 2 diabetes mellitus without complications: Secondary | ICD-10-CM

## 2015-05-17 DIAGNOSIS — E038 Other specified hypothyroidism: Secondary | ICD-10-CM

## 2015-05-17 DIAGNOSIS — E063 Autoimmune thyroiditis: Secondary | ICD-10-CM

## 2015-05-17 DIAGNOSIS — R7989 Other specified abnormal findings of blood chemistry: Secondary | ICD-10-CM | POA: Diagnosis not present

## 2015-05-17 DIAGNOSIS — R945 Abnormal results of liver function studies: Secondary | ICD-10-CM

## 2015-05-17 LAB — COMPREHENSIVE METABOLIC PANEL
ALT: 50 U/L (ref 0–53)
AST: 36 U/L (ref 0–37)
Albumin: 4.5 g/dL (ref 3.5–5.2)
Alkaline Phosphatase: 46 U/L (ref 39–117)
BILIRUBIN TOTAL: 0.8 mg/dL (ref 0.2–1.2)
BUN: 16 mg/dL (ref 6–23)
CALCIUM: 9.5 mg/dL (ref 8.4–10.5)
CO2: 30 mEq/L (ref 19–32)
CREATININE: 0.74 mg/dL (ref 0.40–1.50)
Chloride: 101 mEq/L (ref 96–112)
GFR: 118.36 mL/min (ref >60.00)
GLUCOSE: 94 mg/dL (ref 70–99)
Potassium: 3.9 mEq/L (ref 3.5–5.1)
Sodium: 139 mEq/L (ref 135–145)
Total Protein: 6.9 g/dL (ref 6.0–8.3)

## 2015-05-17 LAB — CBC
HEMATOCRIT: 46.2 % (ref 39.0–52.0)
Hemoglobin: 15.8 g/dL (ref 13.0–17.0)
MCHC: 34.1 g/dL (ref 30.0–36.0)
MCV: 90.7 fl (ref 78.0–100.0)
Platelets: 207 10*3/uL (ref 150.0–400.0)
RBC: 5.1 Mil/uL (ref 4.22–5.81)
RDW: 12.6 % (ref 11.5–15.5)
WBC: 6.7 10*3/uL (ref 4.0–10.5)

## 2015-05-17 LAB — LIPID PANEL
Cholesterol: 92 mg/dL (ref 0–200)
HDL: 42.4 mg/dL (ref >39.00)
LDL CALC: 31 mg/dL (ref 0–99)
NONHDL: 49.23
TRIGLYCERIDES: 90 mg/dL (ref 0.0–149.0)
Total CHOL/HDL Ratio: 2
VLDL: 18 mg/dL (ref 0.0–40.0)

## 2015-05-17 LAB — TSH: TSH: 3.52 u[IU]/mL (ref 0.35–4.50)

## 2015-05-17 LAB — HEMOGLOBIN A1C: HEMOGLOBIN A1C: 5 % (ref 4.6–6.5)

## 2015-05-22 ENCOUNTER — Encounter: Payer: Self-pay | Admitting: Endocrinology

## 2015-05-22 ENCOUNTER — Ambulatory Visit (INDEPENDENT_AMBULATORY_CARE_PROVIDER_SITE_OTHER): Payer: Self-pay | Admitting: Endocrinology

## 2015-05-22 VITALS — BP 138/86 | HR 80 | Temp 98.1°F | Resp 14 | Ht 70.0 in | Wt 224.6 lb

## 2015-05-22 DIAGNOSIS — E119 Type 2 diabetes mellitus without complications: Secondary | ICD-10-CM

## 2015-05-22 DIAGNOSIS — E063 Autoimmune thyroiditis: Secondary | ICD-10-CM

## 2015-05-22 DIAGNOSIS — E038 Other specified hypothyroidism: Secondary | ICD-10-CM

## 2015-05-22 DIAGNOSIS — I10 Essential (primary) hypertension: Secondary | ICD-10-CM

## 2015-05-22 MED ORDER — BISOPROLOL-HYDROCHLOROTHIAZIDE 5-6.25 MG PO TABS: 1.0000 | ORAL_TABLET | Freq: Every day | ORAL | Status: DC

## 2015-05-22 NOTE — Progress Notes (Signed)
Patient ID: Mark Black, male   DOB: April 11, 1966, 49 y.o.   MRN: [id number]   Reason for Appointment:   Follow up for various problems    History of Present Illness:   DIABETES: This has been mild and diagnosed on his glucose tolerance test He has a family history of diabetes and his fasting glucose has been high since 2011 when it was 109.   GTT in 12/13 showed FASTING glucose of 121, two-hour reading of 245. He was started on metformin ER 1000 mg after the GTT results    He has been advised to be consistent in his efforts to lose weight and is generally watching his diet; recently avoiding fast food.  However his compliance with this has been variable over the last couple of years  Had not been able to lose weight   He is now taking 1000 mg of metformin ER without significant side effects, had diarrhea with higher dose  Exercising inconsistently.  He says he was active in playing softball and not doing any now  FASTING blood sugar is now improved at 94; previously range 101-119.   A1c is stable and excellent at 5%   Wt Readings from Last 3 Encounters:  05/22/15 224 lb 9.6 oz (101.878 kg)  01/18/15 225 lb (102.059 kg)  10/19/14 226 lb (102.513 kg)    Lab Results  Component Value Date   HGBA1C 5.0 05/17/2015   HGBA1C 5.0 01/15/2015   HGBA1C 5.6 07/17/2014   Lab Results  Component Value Date   MICROALBUR <0.7 01/15/2015   [place] 31 05/17/2015   CREATININE 0.74 05/17/2015    Hypertension:    He has had long-standing hypertension  Does  monitor his blood pressure at home with recent readings off 130/86-90 He has been on low dose Ziac since 2013 in addition to maximum dose Lotrel for better control.   Blood pressure is relatively high today also   Hypothyroidism:   The hypothyroidism was first diagnosed In 1996 with weight gain and feeling sluggish   The treatments that the patient has taken include Synthroid brand name and  more recently has been on  generic given by his [place].       Compliance with the medical regimen has been as prescribed with taking the tablet in the morning before breakfast.  TSH in 1/16 was markedly increased at almost 14 and he is now taking 150 g  The response to therapy has been  good with no recent fatigue TSH is back to normal now consistently   Lab Results  Component Value Date   TSH 3.52 05/17/2015   TSH 2.28 01/15/2015   TSH 4.43 10/16/2014   FREET4 1.13 12/11/2013   FREET4 1.27 06/06/2013     DEPRESSION: Has history of depression, he has been on Pristiq  Symptoms include getting irritable and he tends to get stressed and anxious. Also was having some difficulty with late insomnia With increasing the Pristiq to 100 mg his symptoms have resolved and he is tolerating this well Occasionally may feel jittery but not anxious    LABS:  Appointment on 05/17/2015  Component Date Value Ref Range Status  . Hgb A1c MFr Bld 05/17/2015 5.0  4.6 - 6.5 % Final   Glycemic Control Guidelines for People with Diabetes:Non Diabetic:  <6%Goal of Therapy: <7%Additional Action Suggested:  >8%   . Sodium 05/17/2015 139  135 - 145 mEq/L Final  . Potassium 05/17/2015 3.9  3.5 - 5.1 mEq/L Final  .  Chloride 05/17/2015 101  96 - 112 mEq/L Final  . CO2 05/17/2015 30  19 - 32 mEq/L Final  . Glucose, Bld 05/17/2015 94  70 - 99 mg/dL Final  . BUN 05/17/2015 16  6 - 23 mg/dL Final  . Creatinine, Ser 05/17/2015 0.74  0.40 - 1.50 mg/dL Final  . Total Bilirubin 05/17/2015 0.8  0.2 - 1.2 mg/dL Final  . Alkaline Phosphatase 05/17/2015 46  39 - 117 U/L Final  . AST 05/17/2015 36  0 - 37 U/L Final  . ALT 05/17/2015 50  0 - 53 U/L Final  . Total Protein 05/17/2015 6.9  6.0 - 8.3 g/dL Final  . Albumin 05/17/2015 4.5  3.5 - 5.2 g/dL Final  . Calcium 05/17/2015 9.5  8.4 - 10.5 mg/dL Final  . GFR 05/17/2015 118.36  >60.00 mL/min Final  . Cholesterol 05/17/2015 92  0 - 200 mg/dL Final   ATP III Classification        Desirable:  < 200 mg/dL               Borderline High:  200 - 239 mg/dL          High:  > = 240 mg/dL  . Triglycerides 05/17/2015 90.0  0.0 - 149.0 mg/dL Final   Normal:  <150 mg/dLBorderline High:  150 - 199 mg/dL  . HDL 05/17/2015 42.40  >39.00 mg/dL Final  . VLDL 05/17/2015 18.0  0.0 - 40.0 mg/dL Final  . LDL Cholesterol 05/17/2015 31  0 - 99 mg/dL Final  . Total CHOL/HDL Ratio 05/17/2015 2   Final                  Men          Women1/2 Average Risk     3.4          3.3Average Risk          5.0          4.42X Average Risk          9.6          7.13X Average Risk          15.0          11.0                      . NonHDL 05/17/2015 49.23   Final   NOTE:  Non-HDL goal should be 30 mg/dL higher than patient's LDL goal (i.e. LDL goal of < 70 mg/dL, would have non-HDL goal of < 100 mg/dL)  . TSH 05/17/2015 3.52  0.35 - 4.50 uIU/mL Final  . WBC 05/17/2015 6.7  4.0 - 10.5 K/uL Final  . RBC 05/17/2015 5.10  4.22 - 5.81 Mil/uL Final  . Platelets 05/17/2015 207.0  150.0 - 400.0 K/uL Final  . Hemoglobin 05/17/2015 15.8  13.0 - 17.0 g/dL Final  . HCT 05/17/2015 46.2  39.0 - 52.0 % Final  . MCV 05/17/2015 90.7  78.0 - 100.0 fl Final  . MCHC 05/17/2015 34.1  30.0 - 36.0 g/dL Final  . RDW 05/17/2015 12.6  11.5 - 15.5 % Final      Medication List       This list is accurate as of: 05/22/15  4:09 PM.  Always use your most recent med list.               amLODipine-benazepril 10-40 MG capsule  Commonly known as:  LOTREL  Take 1 capsule by  mouth daily.     bisoprolol-hydrochlorothiazide 5-6.25 MG tablet  Commonly known as:  ZIAC  Take 1 tablet by mouth daily.     busPIRone 15 MG tablet  Commonly known as:  BUSPAR  TAKE 1 TABLET (15 MG TOTAL) BY MOUTH 2 (TWO) TIMES DAILY.     metformin 1000 MG (OSM) 24 hr tablet  Commonly known as:  FORTAMET  Take 2 tablets (2,000 mg total) by mouth daily after supper. with food     methocarbamol 750 MG tablet  Commonly known as:  ROBAXIN  Take 1 tablet  (750 mg total) by mouth 3 (three) times daily.     PRISTIQ 100 MG 24 hr tablet  Generic drug:  desvenlafaxine  TAKE 1 TABLET EVERY DAY     SYNTHROID 150 MCG tablet  Generic drug:  levothyroxine  TAKE 1 TABLET (150 MCG TOTAL) BY MOUTH DAILY BEFORE BREAKFAST.     vitamin B-12 1000 MCG tablet  Commonly known as:  CYANOCOBALAMIN  Take 1,000 mcg by mouth daily.        Past Medical History  Diagnosis Date  . Vitiligo   . Alopecia totalis     Past Surgical History  Procedure Laterality Date  . Carpal tunnel release      Family History  Problem Relation Age of Onset  . Diabetes Father   . Hypertension Father   . Diabetes Paternal Aunt     Social History:  reports that he has never smoked. He does not have any smokeless tobacco history on file. His alcohol and drug histories are not on file.   Allergies: No Known Allergies  REVIEW of systems:.   Had long-standing vitiligo which is generalized  Also has alopecia totalis except some on his forearms and face  No history of hyperlipidemia. He is total cholesterol is below 100 but also has a low HDL    Examination:   BP 138/86 mmHg  Pulse 80  Temp(Src) 98.1 F (36.7 C)  Resp 14  Ht 5\' 10"  (1.778 m)  Wt 224 lb 9.6 oz (101.878 kg)  BMI 32.23 kg/m2  SpO2 95%   Repeat blood pressure on left arm: 124/90    Assessments/PLAN:   DIABETES: Was diagnosed on glucose tolerance test and he is on metformin  A1c has been consistently normal and his fasting reading is improved at 94 Since he cannot tolerate more than 1000 mg of metformin ER he will continue this and emphasized need for consistent diet and exercise  He seems to be more interested in trying to get his weight down now  HYPOTHYROIDISM treated with 190mcg generic thyroxine;  with increasing the dose in 1/16 his TSH is consistently normal now He will continue brand name Synthroid  Liver function normal  Vitamin B12 deficiency: No anemia  currently  Hypertension: Blood pressure is relatively high today including at home He will increase the Ziac to 5 mg  He will have a complete exam including colorectal screening on the next visit  Layann Bluett 05/22/2015, 4:09 PM

## 2015-05-22 NOTE — Patient Instructions (Signed)
Double Bisoprolol  Keep eye on BP

## 2015-05-23 ENCOUNTER — Other Ambulatory Visit: Payer: Self-pay | Admitting: Endocrinology

## 2015-05-27 ENCOUNTER — Telehealth: Payer: Self-pay | Admitting: Endocrinology

## 2015-05-27 NOTE — Telephone Encounter (Signed)
No answer on call back 

## 2015-05-27 NOTE — Telephone Encounter (Signed)
Patient ask you to call him,.

## 2015-05-28 ENCOUNTER — Telehealth: Payer: Self-pay | Admitting: *Deleted

## 2015-05-28 ENCOUNTER — Other Ambulatory Visit: Payer: Self-pay | Admitting: *Deleted

## 2015-05-28 MED ORDER — VENLAFAXINE HCL ER 150 MG PO CP24: 150.0000 mg | ORAL_CAPSULE | Freq: Every day | ORAL | Status: DC

## 2015-05-28 NOTE — Telephone Encounter (Signed)
He can use generic Effexor XR 150 mg daily

## 2015-05-28 NOTE — Telephone Encounter (Signed)
Noted, patient is aware, rx sent 

## 2015-05-28 NOTE — Telephone Encounter (Signed)
Patient called about his Pristiq, he said that his insurance was under his wife and she has lost her job. He said for a 30 days supply it would cost him $380.   He wants to know if there is a generic that would be cheaper for him? Please advise.

## 2015-07-22 ENCOUNTER — Other Ambulatory Visit: Payer: Self-pay | Admitting: *Deleted

## 2015-07-22 ENCOUNTER — Telehealth: Payer: Self-pay | Admitting: Endocrinology

## 2015-07-22 MED ORDER — SYNTHROID 150 MCG PO TABS: ORAL_TABLET | ORAL | Status: DC

## 2015-07-22 NOTE — Telephone Encounter (Signed)
Pt [facility] [place]

## 2015-08-04 ENCOUNTER — Other Ambulatory Visit: Payer: Self-pay | Admitting: Endocrinology

## 2015-08-06 ENCOUNTER — Other Ambulatory Visit: Payer: Self-pay | Admitting: *Deleted

## 2015-08-06 ENCOUNTER — Telehealth: Payer: Self-pay | Admitting: Endocrinology

## 2015-08-06 MED ORDER — BISOPROLOL-HYDROCHLOROTHIAZIDE 5-6.25 MG PO TABS: 1.0000 | ORAL_TABLET | Freq: Every day | ORAL | Status: DC

## 2015-08-06 NOTE — Telephone Encounter (Signed)
Pt [facility]ol [facility] [place]

## 2015-08-06 NOTE — Telephone Encounter (Signed)
rx sent

## 2015-08-19 ENCOUNTER — Other Ambulatory Visit (INDEPENDENT_AMBULATORY_CARE_PROVIDER_SITE_OTHER): Payer: BLUE CROSS/BLUE SHIELD

## 2015-08-19 DIAGNOSIS — E119 Type 2 diabetes mellitus without complications: Secondary | ICD-10-CM | POA: Diagnosis not present

## 2015-08-19 DIAGNOSIS — E063 Autoimmune thyroiditis: Secondary | ICD-10-CM

## 2015-08-19 DIAGNOSIS — E038 Other specified hypothyroidism: Secondary | ICD-10-CM | POA: Diagnosis not present

## 2015-08-19 LAB — COMPREHENSIVE METABOLIC PANEL
ALBUMIN: 4.2 g/dL (ref 3.5–5.2)
ALT: 43 U/L (ref 0–53)
AST: 32 U/L (ref 0–37)
Alkaline Phosphatase: 44 U/L (ref 39–117)
BILIRUBIN TOTAL: 0.3 mg/dL (ref 0.2–1.2)
BUN: 13 mg/dL (ref 6–23)
CALCIUM: 9.2 mg/dL (ref 8.4–10.5)
CHLORIDE: 103 meq/L (ref 96–112)
CO2: 27 meq/L (ref 19–32)
Creatinine, Ser: 0.75 mg/dL (ref 0.40–1.50)
GFR: 116.42 mL/min (ref >60.00)
Glucose, Bld: 127 mg/dL — ABNORMAL HIGH (ref 70–99)
Potassium: 3.9 mEq/L (ref 3.5–5.1)
Sodium: 138 mEq/L (ref 135–145)
Total Protein: 6.7 g/dL (ref 6.0–8.3)

## 2015-08-19 LAB — HEMOGLOBIN A1C: Hgb A1c MFr Bld: 5.2 % (ref 4.6–6.5)

## 2015-08-19 LAB — TSH: TSH: 4.67 u[IU]/mL — AB (ref 0.35–4.50)

## 2015-08-22 ENCOUNTER — Ambulatory Visit (INDEPENDENT_AMBULATORY_CARE_PROVIDER_SITE_OTHER): Payer: BLUE CROSS/BLUE SHIELD | Admitting: Endocrinology

## 2015-08-22 ENCOUNTER — Encounter: Payer: Self-pay | Admitting: Endocrinology

## 2015-08-22 VITALS — BP 128/88 | HR 70 | Temp 97.6°F | Wt 223.0 lb

## 2015-08-22 DIAGNOSIS — I10 Essential (primary) hypertension: Secondary | ICD-10-CM

## 2015-08-22 DIAGNOSIS — E063 Autoimmune thyroiditis: Secondary | ICD-10-CM

## 2015-08-22 DIAGNOSIS — E119 Type 2 diabetes mellitus without complications: Secondary | ICD-10-CM

## 2015-08-22 DIAGNOSIS — Z0000 Encounter for general adult medical examination without abnormal findings: Secondary | ICD-10-CM | POA: Diagnosis not present

## 2015-08-22 DIAGNOSIS — F32A Depression, unspecified: Secondary | ICD-10-CM

## 2015-08-22 DIAGNOSIS — E038 Other specified hypothyroidism: Secondary | ICD-10-CM

## 2015-08-22 DIAGNOSIS — F329 Major depressive disorder, single episode, unspecified: Secondary | ICD-10-CM | POA: Diagnosis not present

## 2015-08-22 DIAGNOSIS — N500 Atrophy of testis: Secondary | ICD-10-CM

## 2015-08-22 DIAGNOSIS — Z1211 Encounter for screening for malignant neoplasm of colon: Secondary | ICD-10-CM

## 2015-08-22 MED ORDER — DOXAZOSIN MESYLATE 4 MG PO TABS: 4.0000 mg | ORAL_TABLET | Freq: Every day | ORAL | Status: DC

## 2015-08-22 MED ORDER — TRIAMCINOLONE ACETONIDE 0.1 % EX CREA: 1.0000 "application " | TOPICAL_CREAM | Freq: Two times a day (BID) | CUTANEOUS | Status: DC

## 2015-08-22 MED ORDER — METFORMIN HCL ER 500 MG PO TB24: 1000.0000 mg | ORAL_TABLET | Freq: Every day | ORAL | Status: DC

## 2015-08-22 MED ORDER — DESVENLAFAXINE SUCCINATE ER 100 MG PO TB24: 100.0000 mg | ORAL_TABLET | Freq: Every day | ORAL | Status: DC

## 2015-08-22 NOTE — Patient Instructions (Addendum)
Take bisoprolol every other day for the next 10 days and then stop  Start DOXAZOSIN for blood pressure and prostate symptoms as follows: Take half tablet late evening for the first 2 days and then full tablet daily  Please call if blood pressure not well controlled  METFORMIN ER, take 2 tablets daily at dinnertime  Need to schedule routine eye exam  Start regular exercise, increase walking during your daily activities   Change venlafaxine to Pristiq when finished.  Go to [email or web address] to get the $4 co-pay card  Heart healthy diet with limited amounts of starchy foods, sweets and fats

## 2015-08-22 NOTE — Progress Notes (Signed)
Patient ID: Mark Black, male   DOB: 1965-08-12, 50 y.o.   MRN: [id number]   Reason for Appointment:  COMPLETE  physical exam and follow-up of various problems    History of Present Illness:   DIABETES: This has been mild and diagnosed on his glucose tolerance test He has a family history of diabetes and his fasting glucose has been high since 2011 when it was 109.   GTT in 12/13 showed FASTING glucose of 121, two-hour reading of 245. He was started on metformin ER 1000 mg after the GTT results    He has been advised to be consistent in his efforts to lose weight and is generally watching his diet; recently avoiding fast food.  However his compliance with diet and exercise has been variable over the last couple of years  Had not been able to lose weight   He is not taking the previously prescribed 1000 mg of metformin ER since 11/16 since he ran out of the medication and apparently did not get it refilled    FASTING blood sugar is now 127; previously range 101-119.   A1c is stable and excellent at 5.2%   Wt Readings from Last 3 Encounters:  08/22/15 223 lb (101.152 kg)  05/22/15 224 lb 9.6 oz (101.878 kg)  01/18/15 225 lb (102.059 kg)    Lab Results  Component Value Date   HGBA1C 5.2 08/19/2015   HGBA1C 5.0 05/17/2015   HGBA1C 5.0 01/15/2015   Lab Results  Component Value Date   MICROALBUR <0.7 01/15/2015   [place] 31 05/17/2015   CREATININE 0.75 08/19/2015    Hypertension:    He has had long-standing hypertension  Does  monitor his blood pressure at home with recent readings of 0000000, diastolic is mostly close to 90 He has been on low dose Ziac since 2013 in addition to maximum dose Lotrel and this was increased to 5 mg in 11/16.   Blood pressure is relatively high again   BP Readings from Last 3 Encounters:  08/22/15 128/88  05/22/15 138/86  01/18/15 142/92     Hypothyroidism:   The hypothyroidism was first diagnosed In 1996 with  weight gain and feeling sluggish   The treatments that the patient has taken include Synthroid brand name and  more recently has been on generic given by his [place].       Compliance with the medical regimen has been as prescribed with taking the tablet in the morning before breakfast.  TSH in 1/16 was  increased at almost 14 and he is now taking 150 g  The response to therapy has been  good with no recent unusual fatigue or cold intolerance TSH is slightly higher   Lab Results  Component Value Date   TSH 4.67* 08/19/2015   TSH 3.52 05/17/2015   TSH 2.28 01/15/2015   FREET4 1.13 12/11/2013   FREET4 1.27 06/06/2013    DEPRESSION: Has history of long-standing of depression, he has previously been on 100 mg of Pristiq with good control of his anxiety and depression He still tends to have mild late insomnia partly related to getting up because of nocturia When he did not have insurance is Pristiq was changed to Effexor XR 150 mg daily and he does not think it helps his depression quite as much No recent symptoms of anxiety or jitteriness       LABS:  Lab on 08/19/2015  Component Date Value Ref Range Status  . Hgb A1c MFr  Bld 08/19/2015 5.2  4.6 - 6.5 % Final   Glycemic Control Guidelines for People with Diabetes:Non Diabetic:  <6%Goal of Therapy: <7%Additional Action Suggested:  >8%   . Sodium 08/19/2015 138  135 - 145 mEq/L Final  . Potassium 08/19/2015 3.9  3.5 - 5.1 mEq/L Final  . Chloride 08/19/2015 103  96 - 112 mEq/L Final  . CO2 08/19/2015 27  19 - 32 mEq/L Final  . Glucose, Bld 08/19/2015 127* 70 - 99 mg/dL Final  . BUN 08/19/2015 13  6 - 23 mg/dL Final  . Creatinine, Ser 08/19/2015 0.75  0.40 - 1.50 mg/dL Final  . Total Bilirubin 08/19/2015 0.3  0.2 - 1.2 mg/dL Final  . Alkaline Phosphatase 08/19/2015 44  39 - 117 U/L Final  . AST 08/19/2015 32  0 - 37 U/L Final  . ALT 08/19/2015 43  0 - 53 U/L Final  . Total Protein 08/19/2015 6.7  6.0 - 8.3 g/dL Final    . Albumin 08/19/2015 4.2  3.5 - 5.2 g/dL Final  . Calcium 08/19/2015 9.2  8.4 - 10.5 mg/dL Final  . GFR 08/19/2015 116.42  >60.00 mL/min Final  . TSH 08/19/2015 4.67* 0.35 - 4.50 uIU/mL Final      Medication List       This list is accurate as of: 08/22/15  5:13 PM.  Always use your most recent med list.               amLODipine-benazepril 10-40 MG capsule  Commonly known as:  LOTREL  TAKE 1 CAPSULE DAILY     busPIRone 15 MG tablet  Commonly known as:  BUSPAR  TAKE 1 TABLET (15 MG TOTAL) BY MOUTH 2 (TWO) TIMES DAILY.     desvenlafaxine 100 MG 24 hr tablet  Commonly known as:  PRISTIQ  Take 1 tablet (100 mg total) by mouth daily.     doxazosin 4 MG tablet  Commonly known as:  CARDURA  Take 1 tablet (4 mg total) by mouth daily. Take at bedtime     metFORMIN 500 MG 24 hr tablet  Commonly known as:  GLUCOPHAGE-XR  Take 2 tablets (1,000 mg total) by mouth daily with supper.     methocarbamol 750 MG tablet  Commonly known as:  ROBAXIN  Take 1 tablet (750 mg total) by mouth 3 (three) times daily.     MULTIVITAMIN ADULT PO  Take by mouth.     SYNTHROID 150 MCG tablet  Generic drug:  levothyroxine  TAKE 1 TABLET (150 MCG TOTAL) BY MOUTH DAILY BEFORE BREAKFAST.     triamcinolone cream 0.1 %  Commonly known as:  KENALOG  Apply 1 application topically 2 (two) times daily.     venlafaxine XR 150 MG 24 hr capsule  Commonly known as:  EFFEXOR XR  Take 1 capsule (150 mg total) by mouth daily with breakfast.     vitamin B-12 1000 MCG tablet  Commonly known as:  CYANOCOBALAMIN  Take 1,000 mcg by mouth daily.        Past Medical History  Diagnosis Date  . Vitiligo   . Alopecia totalis     Past Surgical History  Procedure Laterality Date  . Carpal tunnel release      Family History  Problem Relation Age of Onset  . Diabetes Father   . Hypertension Father   . Diabetes Paternal Aunt     Social History:  reports that he has never smoked. He does not have any  smokeless tobacco  history on file. His alcohol and drug histories are not on file.   Allergies: No Known Allergies  REVIEW of systems:.   Also has alopecia totalis except some hair on his forearms and face  No history of hyperlipidemia. He is total cholesterol is below 100 but also has a low HDL  Lab Results  Component Value Date   CHOL 92 05/17/2015   HDL 42.40 05/17/2015   LDLCALC 31 05/17/2015   TRIG 90.0 05/17/2015   CHOLHDL 2 05/17/2015        Eyes: Has not had eye exam for several years.  Vision is normal                      No unusual headaches.     ENT: Previous problems with allergic rhinitis, not needing treatment now     Skin:  He has had progressive alopecia since about 1996 now involving almost all body hair.   Had long-standing vitiligo which is generalized   No chest pain on exertion.                 No palpitations.   No swelling of feet.     No shortness of breath on exertion.     Bowel habits: No change.                    Heartburn/pain: Occasional heartburn with certain foods        No joint pains.      No numbness or tingling in hands or feet; no weakness in limbs.     He has nocturia 1-2 times.  He may have some hesitancy in urine, his urine flow is also noticeably slow and especially in the last year      He complains of decreased sexual desire.  Has only mild difficulty with directions Testosterone level was 435 in 3/11.    Examination:   BP 128/88 mmHg  Pulse 70  Temp(Src) 97.6 F (36.4 C) (Oral)  Wt 223 lb (101.152 kg)  SpO2 97%   Repeat blood pressure on left arm: 134/92  Physical Exam  Constitutional: He appears well-developed and well-nourished.  HENT:  Mouth/Throat: Oropharynx is clear and moist.  Eyes: Conjunctivae are normal.  Fundus exam shows no vascular changes or disc abnormality  Neck: No JVD present. No thyromegaly present.  No carotid bruit  Cardiovascular: Normal rate, regular rhythm and normal heart sounds.   Exam reveals no gallop.   No murmur heard. Pulmonary/Chest: Breath sounds normal. He has no wheezes. He has no rales.  Small amount of gynecomastia on the right and more significant on the left, about 3 cm firm  Abdominal: Soft. He exhibits no distension and no mass. There is no tenderness. No hernia.  Genitourinary: Rectum normal. Prostate is enlarged. Prostate is not tender. Left testis shows swelling.  Moderate swelling of the left testicle, smooth and relatively soft.  Right testicle relatively smaller than normal about 3 cm, flat  Musculoskeletal: He exhibits no edema.  Lymphadenopathy:    He has no cervical adenopathy.  Neurological: He has normal reflexes. He displays normal reflexes. No cranial nerve deficit. He exhibits normal muscle tone.  Skin: Rash noted. No pallor.  Scattered few macular reddish areas on legs or arms. Has generalized alopecia including eyebrows and body hair Scattered areas of vitiligo, extensive  Psychiatric: He has a normal mood and affect.  Vitals reviewed.  Diabetic Foot Exam - Simple   Simple Foot  Form  Diabetic Foot exam was performed with the following findings:  Yes 08/22/2015  3:46 PM  Visual Inspection  No deformities, no ulcerations, no other skin breakdown bilaterally:  Yes  Sensation Testing  Intact to touch and monofilament testing bilaterally:  Yes  Pulse Check  Posterior Tibialis and Dorsalis pulse intact bilaterally:  Yes  Comments        Assessments/PLAN:   DIABETES: Mild and diagnosed on glucose tolerance test and he is on metformin  A1c has been consistently normal  He is not taking his metformin recently and fasting glucose is up to 127 although A1c is only slightly higher He does need to start back on his exercise regimen and encouraged regular walking also Reminded him to work on diet with low fat low calorie foods and restricted amount of carbohydrates He would restart metformin ER 1000 mg daily and have fasting glucose done on  the next visit  HYPOTHYROIDISM treated with 125mcg Although his TSH is minimally increased he feels fairly good subjectively and will continue to monitor He will continue brand name Synthroid  DEPRESSION: He has fairly good control with the Effexor XR but he thinks he felt better emotionally with Pristiq and he can go back on this.  Advised him to use a co-pay card also for this  Decreased libido: Although previously had a normal testosterone he does appear to have some testicular atrophy especially on the right and the left side may be also small with current hydrocele.  Since he is likely to have insulin resistance syndrome he will need to be evaluated with free and total testosterone level and LH with a.m. labs on the next visit  Vitamin B12 deficiency: He will continue 1000 g daily  Hypertension: Blood pressure is still high today and not improved by increasing bisoprolol 5 mg Will change bisoprolol to Cardura especially with his prostatic symptoms He will taper off the bisoprolol by taking it every other day for 10 days She will follow-up in 2 months  EKG to be done today to look for LVH  BENIGN prostatic hypertrophy.  He appears to be mildly symptomatic from this, has a smooth and enlarged prostate on exam. Will benefit from Cardura but consider using Proscar long-term   PREVENTIVE care:   Has not been getting influenza vaccines, Pneumovax so far.  Does not remember when he had his last tetanus toxoids  Diet and exercise program discussed above  He agrees to a screening colonoscopy  He will schedule eye exam  Continue regular dental exams  Discussed safety at home and while driving  No indication for prophylactic aspirin    Patient Instructions  Take bisoprolol every other day for the next 10 days and then stop  Start DOXAZOSIN for blood pressure and prostate symptoms as follows: Take half tablet late evening for the first 2 days and then full tablet  daily  Please call if blood pressure not well controlled  METFORMIN ER, take 2 tablets daily at dinnertime  Need to schedule routine eye exam  Start regular exercise, increase walking during your daily activities   Change venlafaxine to Pristiq when finished.  Go to [email or web address] to get the $4 co-pay card  Heart healthy diet with limited amounts of starchy foods, sweets and fats      Fatmata Legere 08/22/2015, 5:13 PM

## 2015-09-26 ENCOUNTER — Telehealth: Payer: Self-pay | Admitting: Endocrinology

## 2015-09-26 NOTE — Telephone Encounter (Signed)
Patient wants the generic version of Pristiq, he was advised to call his pharmacy to ask for that.

## 2015-09-26 NOTE — Telephone Encounter (Signed)
Pt would like to switch back to the medication he was taking before he started taking the Pristiq.  He does not feel like the Pristiq is working for him.

## 2015-10-21 ENCOUNTER — Telehealth: Payer: Self-pay | Admitting: Endocrinology

## 2015-10-21 ENCOUNTER — Other Ambulatory Visit: Payer: Self-pay | Admitting: *Deleted

## 2015-10-21 MED ORDER — LEVOTHYROXINE SODIUM 150 MCG PO TABS: 150.0000 ug | ORAL_TABLET | Freq: Every day | ORAL | Status: DC

## 2015-10-21 NOTE — Telephone Encounter (Signed)
Pt needs [place] to call in synthroid the generic only [facility]

## 2015-10-21 NOTE — Telephone Encounter (Signed)
Rx sent 

## 2015-10-21 NOTE — Telephone Encounter (Signed)
ok 

## 2015-10-21 NOTE — Telephone Encounter (Signed)
Is it okay to send in generic?

## 2015-11-14 ENCOUNTER — Other Ambulatory Visit (INDEPENDENT_AMBULATORY_CARE_PROVIDER_SITE_OTHER): Payer: PRIVATE HEALTH INSURANCE

## 2015-11-14 DIAGNOSIS — E063 Autoimmune thyroiditis: Secondary | ICD-10-CM

## 2015-11-14 DIAGNOSIS — E038 Other specified hypothyroidism: Secondary | ICD-10-CM

## 2015-11-14 DIAGNOSIS — E119 Type 2 diabetes mellitus without complications: Secondary | ICD-10-CM

## 2015-11-14 DIAGNOSIS — N500 Atrophy of testis: Secondary | ICD-10-CM | POA: Diagnosis not present

## 2015-11-14 LAB — TSH: TSH: 2.69 u[IU]/mL (ref 0.35–4.50)

## 2015-11-14 LAB — GLUCOSE, RANDOM: GLUCOSE: 120 mg/dL — AB (ref 70–99)

## 2015-11-14 LAB — LUTEINIZING HORMONE: LH: 5.34 m[IU]/mL (ref 1.50–9.30)

## 2015-11-16 LAB — TESTOSTERONE, FREE, TOTAL, SHBG
SEX HORMONE BINDING: 41 nmol/L (ref 19.3–76.4)
TESTOSTERONE FREE: 5.2 pg/mL — AB (ref 7.2–24.0)
Testosterone: 465 ng/dL (ref 348–1197)

## 2015-11-19 ENCOUNTER — Ambulatory Visit (INDEPENDENT_AMBULATORY_CARE_PROVIDER_SITE_OTHER): Payer: PRIVATE HEALTH INSURANCE | Admitting: Endocrinology

## 2015-11-19 VITALS — BP 126/74 | HR 76 | Temp 98.2°F | Resp 14 | Ht 70.0 in | Wt 222.0 lb

## 2015-11-19 DIAGNOSIS — E119 Type 2 diabetes mellitus without complications: Secondary | ICD-10-CM | POA: Diagnosis not present

## 2015-11-19 DIAGNOSIS — E291 Testicular hypofunction: Secondary | ICD-10-CM | POA: Diagnosis not present

## 2015-11-19 DIAGNOSIS — I10 Essential (primary) hypertension: Secondary | ICD-10-CM | POA: Diagnosis not present

## 2015-11-19 DIAGNOSIS — N400 Enlarged prostate without lower urinary tract symptoms: Secondary | ICD-10-CM

## 2015-11-19 MED ORDER — CLOMIPHENE CITRATE 50 MG PO TABS: ORAL_TABLET | ORAL | Status: DC

## 2015-11-19 NOTE — Progress Notes (Signed)
Patient ID: Mark Black, male   DOB: 03-Aug-1965, 50 y.o.   MRN: [id number]   Reason for Appointment:   follow-up of various problems    History of Present Illness:   DIABETES: This has been mild and diagnosed on his glucose tolerance test He has a family history of diabetes and his fasting glucose has been high since 2011 when it was 109.   GTT in 12/13 showed FASTING glucose of 121, two-hour reading of 245. He was started on metformin ER 1000 mg after the GTT results    He has been advised to be consistent in his efforts to lose weight   However his compliance with diet and exercise has been variable over the last couple of years   Had not been able to lose weight  and has gained some since his last visit primarily because of not exercising and also inconsistent diet especially last weekend  He is not taking the previously prescribed 1000 mg of metformin ER an only taking one tablet as he misunderstood    FASTING blood sugar is now 120, previously 127; previously lowest 101 A1c is stable and excellent at 5.2%  This year   Wt Readings from Last 3 Encounters:  08/22/15 223 lb (101.152 kg)  05/22/15 224 lb 9.6 oz (101.878 kg)  01/18/15 225 lb (102.059 kg)    Lab Results  Component Value Date   HGBA1C 5.2 08/19/2015   HGBA1C 5.0 05/17/2015   HGBA1C 5.0 01/15/2015   Lab Results  Component Value Date   MICROALBUR <0.7 01/15/2015   [place] 31 05/17/2015   CREATININE 0.75 08/19/2015    Hypertension:    He has had long-standing hypertension  Does  monitor his blood pressure at home periodically but not regularly recently  blood pressure has not been He has been on low dose  Doxazosin instead of Ziac since 2/17 because of his prostatism  Blood pressure is  Fairly good today, slightly higher systolic   BP Readings from Last 3 Encounters:  08/22/15 128/88  05/22/15 138/86  01/18/15 142/92     Hypothyroidism:   The hypothyroidism was first diagnosed In  1996 with weight gain and feeling sluggish   The treatments that the patient has taken include Synthroid brand name and  more recently has been on generic given by his [place].       Compliance with the medical regimen has been as prescribed with taking the tablet in the morning before breakfast.  TSH in 1/16 was  increased at almost 14 and he is now taking 150 g TSH is normal now even without any change in his dosage in 2/17  taking Generic now because of cost   Lab Results  Component Value Date   TSH 2.69 11/14/2015   TSH 4.67* 08/19/2015   TSH 3.52 05/17/2015   FREET4 1.13 12/11/2013   FREET4 1.27 06/06/2013    DEPRESSION: Has history of long-standing of depression, he has previously been on 100 mg of Pristiq with good control of his anxiety and depression He still tends to have mild late insomnia partly related to getting up because of nocturia When he did not have insurance is Pristiq was changed to Effexor XR 150 mg daily and he does not think it helps his depression quite as much     PROSTATISM:  He has nocturia, previously up to 3 times but with starting doxazosin only about 1 time.  He also has less hesitancy in urine, his urine  flow is improved with starting doxazosin   HYPOGONADISM:  He complains of decreased sexual desire.  Has only mild difficulty with directions Testosterone level is still over 400 but his free testosterone is relatively low with normal LH.    LABS:  Lab on 11/14/2015  Component Date Value Ref Range Status  . Glucose, Bld 11/14/2015 120* 70 - 99 mg/dL Final  . TSH 11/14/2015 2.69  0.35 - 4.50 uIU/mL Final  . LH 11/14/2015 5.34  1.50 - 9.30 mIU/mL Final   Comment: Male Reference Range:20-70 yrs     1.5-9.3 mIU/mL>70 yrs       3.1-35.6 mIU/mLFemale Reference Range:Follicular Phase     [id number] mIU/mLMidcycle             8.7-76.3 mIU/mLLuteal Phase         0.5-16.9 mIU/mL  Post Menopausal      15.9-54.0  mIU/mLPregnant             <1.5  mIU/mLContraceptives       0.7-5.6 mIU/mL   . Testosterone 11/14/2015 465  348 - 1197 ng/dL Final  . Comment, Testosterone 11/14/2015 Comment   Final   Comment: Adult male reference interval is based on a population of lean males up to 52 years old.   . Testosterone, Free 11/14/2015 5.2* 7.2 - 24.0 pg/mL Final  . Sex Hormone Binding 11/14/2015 41.0  19.3 - 76.4 nmol/L Final      Medication List       This list is accurate as of: 11/19/15  9:22 PM.  Always use your most recent med list.               amLODipine-benazepril 10-40 MG capsule  Commonly known as:  LOTREL  TAKE 1 CAPSULE DAILY     clomiPHENE 50 MG tablet  Commonly known as:  CLOMID  1/2 pill twice weekly     doxazosin 4 MG tablet  Commonly known as:  CARDURA  Take 1 tablet (4 mg total) by mouth daily. Take at bedtime     levothyroxine 150 MCG tablet  Commonly known as:  SYNTHROID, LEVOTHROID  Take 1 tablet (150 mcg total) by mouth daily.     metFORMIN 500 MG 24 hr tablet  Commonly known as:  GLUCOPHAGE-XR  Take 2 tablets (1,000 mg total) by mouth daily with supper.     methocarbamol 750 MG tablet  Commonly known as:  ROBAXIN  Take 1 tablet (750 mg total) by mouth 3 (three) times daily.     MULTIVITAMIN ADULT PO  Take by mouth.     triamcinolone cream 0.1 %  Commonly known as:  KENALOG  Apply 1 application topically 2 (two) times daily.     venlafaxine XR 150 MG 24 hr capsule  Commonly known as:  EFFEXOR XR  Take 1 capsule (150 mg total) by mouth daily with breakfast.     vitamin B-12 1000 MCG tablet  Commonly known as:  CYANOCOBALAMIN  Take 1,000 mcg by mouth daily.        Past Medical History  Diagnosis Date  . Vitiligo   . Alopecia totalis     Past Surgical History  Procedure Laterality Date  . Carpal tunnel release      Family History  Problem Relation Age of Onset  . Diabetes Father   . Hypertension Father   . Diabetes Paternal Aunt     Social History:  reports that he has  never smoked. He does not have any smokeless  tobacco history on file. His alcohol and drug histories are not on file.   Allergies: No Known Allergies  REVIEW of systems:.    He has alopecia totalis except some hair on his forearms and face  No history of hyperlipidemia. He is total cholesterol is below 100 but also has a low HDL  Lab Results  Component Value Date   CHOL 92 05/17/2015   HDL 42.40 05/17/2015   LDLCALC 31 05/17/2015   TRIG 90.0 05/17/2015   CHOLHDL 2 05/17/2015      Physical Exam  Ht 5\' 10"  (1.778 m)    Assessments/PLAN:   DIABETES: Mild and diagnosed on glucose tolerance test and he is on metformin  A1c has been consistently normal  He is not taking The full dose of 1000 mg of his metformin recently and fasting glucose is up to 120 He does need to start back on his exercise regimen and he says he will be doing more exercise with starting softball Also needs to do better on his diet as he has gained weight  He would increase the dose of metformin ER up to 1000 mg daily and have fasting glucose and A1c done on the next visit  HYPOTHYROIDISM treated with 122mcg He is taking generic levothyroxine and TSH is now back to normal  DEPRESSION/anxiety: He has fairly control with the Effexor XR but he thinks he felt better with Pristiq. For now he wants to stay on Effexor since he cannot afford Pristiq because of the higher co-pay for brand name  Decreased libido: Although previously had a normal testosterone he does appear to have some testicular atrophy especially on the right and the left side may be also small with current hydrocele.  Since he is likely to have insulin resistance syndrome he will need to be evaluated with free and total testosterone level and LH with a.m. labs on the next visit  Vitamin B12 deficiency: He will continue 1000 g daily  Hypertension: Blood pressure is relatively better with using Cardura instead of bisoprolol HCT Systolic is  relatively still high today but will continue and hopefully with weight loss and exercise this will improve also  BENIGN prostatic hypertrophy.  He appears to be getting less symptomatic with using doxazosin Since he had some nausea with 4 mg will continue half tablet for now  HYPOGONADISM: He has low free testosterone and is probably having some decreased libido from this Will empirically try 25 mg clomiphene twice a week to see if he will subjectively feels better with getting free testosterone back to normal, discussed implications of this with insulin resistance syndrome and how clomiphene works Also will check prolactin   Patient Instructions  Metformin 2 daily at dinner    Lary Eckardt 11/19/2015, 9:22 PM

## 2015-11-19 NOTE — Patient Instructions (Signed)
Metformin 2 daily at dinner

## 2015-11-20 ENCOUNTER — Encounter: Payer: Self-pay | Admitting: Endocrinology

## 2015-11-21 ENCOUNTER — Other Ambulatory Visit: Payer: Self-pay | Admitting: *Deleted

## 2015-11-21 ENCOUNTER — Telehealth: Payer: Self-pay | Admitting: Endocrinology

## 2015-11-21 MED ORDER — AMLODIPINE BESY-BENAZEPRIL HCL 10-40 MG PO CAPS: 1.0000 | ORAL_CAPSULE | Freq: Every day | ORAL | Status: DC

## 2015-11-21 NOTE — Telephone Encounter (Signed)
PT husband needs [facility] [place] [place]

## 2015-11-21 NOTE — Telephone Encounter (Signed)
rx sent

## 2015-11-26 ENCOUNTER — Other Ambulatory Visit: Payer: Self-pay | Admitting: *Deleted

## 2015-12-02 ENCOUNTER — Telehealth: Payer: Self-pay | Admitting: Endocrinology

## 2015-12-02 ENCOUNTER — Other Ambulatory Visit: Payer: Self-pay | Admitting: *Deleted

## 2015-12-02 NOTE — Telephone Encounter (Signed)
I spoke with patients wife and updated his medication list.

## 2015-12-02 NOTE — Telephone Encounter (Signed)
Pt wife has 2 meds that they have at home that are not on the med list please help her

## 2016-01-15 ENCOUNTER — Other Ambulatory Visit: Payer: Self-pay

## 2016-01-15 MED ORDER — LEVOTHYROXINE SODIUM 150 MCG PO TABS: 150.0000 ug | ORAL_TABLET | Freq: Every day | ORAL | Status: DC

## 2016-01-30 ENCOUNTER — Other Ambulatory Visit: Payer: Self-pay

## 2016-01-30 MED ORDER — DESVENLAFAXINE SUCCINATE ER 100 MG PO TB24: ORAL_TABLET | ORAL | Status: DC

## 2016-02-03 ENCOUNTER — Telehealth: Payer: Self-pay

## 2016-02-03 ENCOUNTER — Telehealth: Payer: Self-pay | Admitting: Endocrinology

## 2016-02-03 NOTE — Telephone Encounter (Signed)
Connie w/[facility] in [place] would like the patient's Bisoprolol / HCTZ 5-6.25mg  refilled.

## 2016-02-03 NOTE — Telephone Encounter (Signed)
Call from pharmacy requesting refill for Bisoprolol / HCTZ 5-6.25 mg. Not on patient med list. Looks like patient is now taking Lotrel. Please advise. Thank you.

## 2016-02-03 NOTE — Telephone Encounter (Signed)
Called and spoke with patient he is taking the Lotrel 10-40mg  daily, and no longer taking the requested medication from the pharmacy. Will call pharmacy and notify them.

## 2016-02-03 NOTE — Telephone Encounter (Signed)
He was switched from bisoprolol to doxazosin, please find out what he is taking

## 2016-02-13 ENCOUNTER — Other Ambulatory Visit: Payer: PRIVATE HEALTH INSURANCE

## 2016-02-14 ENCOUNTER — Other Ambulatory Visit: Payer: BLUE CROSS/BLUE SHIELD

## 2016-02-19 ENCOUNTER — Other Ambulatory Visit: Payer: Self-pay | Admitting: Endocrinology

## 2016-02-19 ENCOUNTER — Ambulatory Visit: Payer: BLUE CROSS/BLUE SHIELD | Admitting: Endocrinology

## 2016-04-03 ENCOUNTER — Other Ambulatory Visit: Payer: Self-pay | Admitting: Endocrinology

## 2016-04-03 ENCOUNTER — Other Ambulatory Visit: Payer: Self-pay | Admitting: *Deleted

## 2016-04-03 MED ORDER — DOXAZOSIN MESYLATE 4 MG PO TABS: 4.0000 mg | ORAL_TABLET | Freq: Every day | ORAL | 1 refills | Status: DC

## 2016-04-03 MED ORDER — DOXAZOSIN MESYLATE 4 MG PO TABS: 4.0000 mg | ORAL_TABLET | Freq: Every day | ORAL | 3 refills | Status: DC

## 2016-04-16 ENCOUNTER — Other Ambulatory Visit: Payer: Self-pay | Admitting: Endocrinology

## 2016-04-16 NOTE — Telephone Encounter (Signed)
Patient needs appointment.

## 2016-05-27 ENCOUNTER — Other Ambulatory Visit: Payer: Self-pay | Admitting: Endocrinology

## 2016-06-03 ENCOUNTER — Other Ambulatory Visit: Payer: Self-pay

## 2016-06-03 ENCOUNTER — Telehealth: Payer: Self-pay | Admitting: Endocrinology

## 2016-06-03 MED ORDER — DESVENLAFAXINE SUCCINATE ER 100 MG PO TB24: ORAL_TABLET | ORAL | 1 refills | Status: DC

## 2016-06-03 NOTE — Telephone Encounter (Signed)
desvenlafaxine (PRISTIQ) 100 MG 24 hr tablet  Send to  [id number] [place], [place] - [address] [phone] (Phone) [phone] (Fax)

## 2016-06-03 NOTE — Telephone Encounter (Signed)
Ordered 06/03/16 

## 2016-06-08 ENCOUNTER — Other Ambulatory Visit: Payer: Self-pay

## 2016-06-25 ENCOUNTER — Other Ambulatory Visit: Payer: Self-pay | Admitting: Endocrinology

## 2016-06-29 ENCOUNTER — Other Ambulatory Visit: Payer: Self-pay | Admitting: Endocrinology

## 2016-06-29 ENCOUNTER — Other Ambulatory Visit: Payer: Self-pay

## 2016-06-29 MED ORDER — AMLODIPINE BESY-BENAZEPRIL HCL 10-40 MG PO CAPS: 1.0000 | ORAL_CAPSULE | Freq: Every day | ORAL | 1 refills | Status: DC

## 2016-06-29 MED ORDER — AMLODIPINE BESY-BENAZEPRIL HCL 10-40 MG PO CAPS: 1.0000 | ORAL_CAPSULE | Freq: Every day | ORAL | 0 refills | Status: DC

## 2016-06-29 NOTE — Telephone Encounter (Signed)
Patient asking for refill on amlodipine-benazepril has not been seen since

## 2016-06-29 NOTE — Telephone Encounter (Signed)
Void last note patient asking for refill on amlodipine-benazepril has not been seen since 11/19/15 and has no future appointment ok to refill please advise

## 2016-06-29 NOTE — Telephone Encounter (Signed)
Ordered 06/29/16

## 2016-06-29 NOTE — Telephone Encounter (Signed)
Give # 15 only

## 2016-07-03 NOTE — Telephone Encounter (Signed)
[facility] [place]

## 2016-07-07 MED ORDER — METFORMIN HCL ER 500 MG PO TB24: 1000.0000 mg | ORAL_TABLET | Freq: Every day | ORAL | 3 refills | Status: DC

## 2016-07-07 NOTE — Telephone Encounter (Signed)
Refill submitted. 

## 2016-07-21 ENCOUNTER — Telehealth: Payer: Self-pay | Admitting: Endocrinology

## 2016-07-21 NOTE — Telephone Encounter (Signed)
Pt [facility] [place]

## 2016-07-22 MED ORDER — LEVOTHYROXINE SODIUM 150 MCG PO TABS: 150.0000 ug | ORAL_TABLET | Freq: Every day | ORAL | 0 refills | Status: DC

## 2016-07-22 MED ORDER — CLOMIPHENE CITRATE 50 MG PO TABS: ORAL_TABLET | ORAL | 2 refills | Status: DC

## 2016-07-22 NOTE — Telephone Encounter (Signed)
Refill submitted. 

## 2016-09-17 ENCOUNTER — Other Ambulatory Visit: Payer: PRIVATE HEALTH INSURANCE

## 2016-09-21 ENCOUNTER — Ambulatory Visit: Payer: PRIVATE HEALTH INSURANCE | Admitting: Endocrinology

## 2016-10-19 ENCOUNTER — Other Ambulatory Visit (INDEPENDENT_AMBULATORY_CARE_PROVIDER_SITE_OTHER): Payer: 59

## 2016-10-19 ENCOUNTER — Other Ambulatory Visit: Payer: PRIVATE HEALTH INSURANCE

## 2016-10-19 DIAGNOSIS — E291 Testicular hypofunction: Secondary | ICD-10-CM | POA: Diagnosis not present

## 2016-10-19 DIAGNOSIS — E119 Type 2 diabetes mellitus without complications: Secondary | ICD-10-CM

## 2016-10-19 LAB — HEMOGLOBIN A1C: HEMOGLOBIN A1C: 5.4 % (ref 4.6–6.5)

## 2016-10-19 LAB — BASIC METABOLIC PANEL
BUN: 15 mg/dL (ref 6–23)
CHLORIDE: 104 meq/L (ref 96–112)
CO2: 28 mEq/L (ref 19–32)
Calcium: 9.1 mg/dL (ref 8.4–10.5)
Creatinine, Ser: 0.82 mg/dL (ref 0.40–1.50)
GFR: 104.55 mL/min (ref >60.00)
Glucose, Bld: 98 mg/dL (ref 70–99)
POTASSIUM: 3.6 meq/L (ref 3.5–5.1)
SODIUM: 138 meq/L (ref 135–145)

## 2016-10-21 LAB — PROLACTIN: Prolactin: 14.4 ng/mL (ref 4.0–15.2)

## 2016-10-21 LAB — TESTOSTERONE, TOTAL, LC/MS: Testosterone, total: 643.8 ng/dL (ref 264.0–916.0)

## 2016-10-22 ENCOUNTER — Encounter: Payer: Self-pay | Admitting: Endocrinology

## 2016-10-22 ENCOUNTER — Ambulatory Visit (INDEPENDENT_AMBULATORY_CARE_PROVIDER_SITE_OTHER): Payer: 59 | Admitting: Endocrinology

## 2016-10-22 VITALS — BP 128/76 | HR 101 | Ht 70.0 in | Wt 228.0 lb

## 2016-10-22 DIAGNOSIS — F329 Major depressive disorder, single episode, unspecified: Secondary | ICD-10-CM

## 2016-10-22 DIAGNOSIS — E063 Autoimmune thyroiditis: Secondary | ICD-10-CM

## 2016-10-22 DIAGNOSIS — N139 Obstructive and reflux uropathy, unspecified: Secondary | ICD-10-CM

## 2016-10-22 DIAGNOSIS — E291 Testicular hypofunction: Secondary | ICD-10-CM

## 2016-10-22 DIAGNOSIS — R7301 Impaired fasting glucose: Secondary | ICD-10-CM | POA: Diagnosis not present

## 2016-10-22 DIAGNOSIS — I10 Essential (primary) hypertension: Secondary | ICD-10-CM | POA: Diagnosis not present

## 2016-10-22 DIAGNOSIS — E038 Other specified hypothyroidism: Secondary | ICD-10-CM

## 2016-10-22 DIAGNOSIS — F32A Depression, unspecified: Secondary | ICD-10-CM

## 2016-10-22 LAB — TSH: TSH: 1.42 u[IU]/mL (ref 0.35–4.50)

## 2016-10-22 MED ORDER — BISOPROLOL-HYDROCHLOROTHIAZIDE 5-6.25 MG PO TABS: 1.0000 | ORAL_TABLET | Freq: Every day | ORAL | 1 refills | Status: DC

## 2016-10-22 MED ORDER — TAMSULOSIN HCL 0.4 MG PO CAPS: 0.4000 mg | ORAL_CAPSULE | Freq: Every day | ORAL | 3 refills | Status: DC

## 2016-10-22 NOTE — Patient Instructions (Addendum)
Stop Doxazosin and start Bisoprolol

## 2016-10-22 NOTE — Progress Notes (Signed)
Patient ID: Mark Black, male   DOB: 07-01-1966, 51 y.o.   MRN: [id number]   Reason for Appointment:   follow-up of various problems    History of Present Illness:   He has not been seen in follow-up for almost a year  DIABETES: This has been mild and diagnosed on his glucose tolerance test He has a family history of diabetes and his fasting glucose has been high since 2011 when it was 109.   GTT in 12/13 showed FASTING glucose of 121, two-hour reading of 245. He was started on metformin ER 1000 mg after the GTT results    He has been advised to be consistent in his efforts to lose weight   However his compliance with diet and exercise has been variable   Had not been able to lose weight  and has gained some since his last visit because of not exercising, has had back problems  He is taking the previously prescribed 1000 mg of metformin ER  A1c is stable and excellent at 5.4, previously 5.2% Fasting glucose is not available, previously 120   Wt Readings from Last 3 Encounters:  10/22/16 228 lb (103.4 kg)  11/19/15 222 lb (100.7 kg)  08/22/15 223 lb (101.2 kg)    Lab Results  Component Value Date   HGBA1C 5.4 10/19/2016   HGBA1C 5.2 08/19/2015   HGBA1C 5.0 05/17/2015   Lab Results  Component Value Date   MICROALBUR <0.7 01/15/2015   [place] 31 05/17/2015   CREATININE 0.82 10/19/2016    Hypertension:    He has had long-standing hypertension  Does  monitor his blood pressure at home periodically   He has been on  Doxazosin instead of Ziac since 2/17 because of his prostatism  Blood pressure is controlled   BP Readings from Last 3 Encounters:  10/22/16 128/76  11/19/15 126/74  08/22/15 128/88     Hypothyroidism:   The hypothyroidism was first diagnosed In 1996 with weight gain and feeling sluggish   The treatments that the patient has taken include Synthroid brand name and  more recently has been on generic Compliance with the medical regimen  has been as prescribed with taking the tablet in the morning before breakfast. He does not complain of unusual fatigue, however does complain of feeling more jittery No recent labs available   Lab Results  Component Value Date   TSH 2.69 11/14/2015   TSH 4.67 (H) 08/19/2015   TSH 3.52 05/17/2015   FREET4 1.13 12/11/2013   FREET4 1.27 06/06/2013    DEPRESSION: Has history of long-standing of depression, he has previously been on 100 mg of Pristiq with good control of his anxiety and depression  He Now is complaining of more anxiety at times He reports better control of depression with Pristiq compared to Effexor    PROSTATISM:  He has nocturia, previously up to 3 times but with a 4 mg doxazosin he is still having some hesitancy and reduced stream   HYPOGONADISM:  In 2017 had complaints of decreased libido and mild erectile dysfunction Free testosterone was mildly decreased and he has been on clomiphene half tablet twice a week  His symptoms are improved with this treatment and his testosterone level is back to normal     LABS:  Lab on 10/19/2016  Component Date Value Ref Range Status  . Hgb A1c MFr Bld 10/19/2016 5.4  4.6 - 6.5 % Final  . Sodium 10/19/2016 138  135 - 145 mEq/L Final  .  Potassium 10/19/2016 3.6  3.5 - 5.1 mEq/L Final  . Chloride 10/19/2016 104  96 - 112 mEq/L Final  . CO2 10/19/2016 28  19 - 32 mEq/L Final  . Glucose, Bld 10/19/2016 98  70 - 99 mg/dL Final  . BUN 10/19/2016 15  6 - 23 mg/dL Final  . Creatinine, Ser 10/19/2016 0.82  0.40 - 1.50 mg/dL Final  . Calcium 10/19/2016 9.1  8.4 - 10.5 mg/dL Final  . GFR 10/19/2016 104.55  >60.00 mL/min Final  . Prolactin 10/19/2016 14.4  4.0 - 15.2 ng/mL Final  . Testosterone, total 10/19/2016 643.8  264.0 - 916.0 ng/dL Final   Comment: This [facility] LC/MS-MS method is currently certified by the [employer] Program ([employer]). Adult male reference interval is based on a population of healthy nonobese  males (BMI <30) between 91 and 61 years old. [place], [place] [phone]. PMID: 19379024. This test was developed and its performance characteristics determined by [facility]. It has not been cleared or approved by the Food and Drug Administration.     Allergies as of 10/22/2016   No Known Allergies     Medication List       Accurate as of 10/22/16  9:55 AM. Always use your most recent med list.          amLODipine-benazepril 10-40 MG capsule Commonly known as:  LOTREL Take 1 capsule by mouth daily.   bisoprolol-hydrochlorothiazide 5-6.25 MG tablet Commonly known as:  ZIAC Take 1 tablet by mouth daily.   clomiPHENE 50 MG tablet Commonly known as:  CLOMID 1/2 pill twice weekly   desvenlafaxine 100 MG 24 hr tablet Commonly known as:  PRISTIQ TAKE 1 TABLET (100 MG TOTAL) BY MOUTH DAILY.   levothyroxine 150 MCG tablet Commonly known as:  SYNTHROID, LEVOTHROID Take 1 tablet (150 mcg total) by mouth daily.   metFORMIN 500 MG 24 hr tablet Commonly known as:  GLUCOPHAGE-XR Take 2 tablets (1,000 mg total) by mouth daily with supper.   methocarbamol 750 MG tablet Commonly known as:  ROBAXIN Take 1 tablet (750 mg total) by mouth 3 (three) times daily.   MULTIVITAMIN ADULT PO Take by mouth.   tamsulosin 0.4 MG Caps capsule Commonly known as:  FLOMAX Take 1 capsule (0.4 mg total) by mouth daily after supper.   triamcinolone cream 0.1 % Commonly known as:  KENALOG Apply 1 application topically 2 (two) times daily.   vitamin B-12 1000 MCG tablet Commonly known as:  CYANOCOBALAMIN Take 1,000 mcg by mouth daily.       Past Medical History:  Diagnosis Date  . Alopecia totalis   . Vitiligo     Past Surgical History:  Procedure Laterality Date  . CARPAL TUNNEL RELEASE      Family History  Problem Relation Age of Onset  . Diabetes Father   . Hypertension Father   . Diabetes Paternal Aunt     Social History:  reports that he has never smoked. He  has never used smokeless tobacco. His alcohol and drug histories are not on file.   Allergies: No Known Allergies  REVIEW of systems:.    He has alopecia totalis except some hair on his forearms and face  No history of hyperlipidemia. He is total cholesterol is below 100 but also has a low HDL  Lab Results  Component Value Date   CHOL 92 05/17/2015   HDL 42.40 05/17/2015   LDLCALC 31 05/17/2015   TRIG 90.0 05/17/2015   CHOLHDL 2 05/17/2015  Physical Exam  BP 128/76   Pulse (!) 101   Ht 5\' 10"  (1.778 m)   Wt 228 lb (103.4 kg)   BMI 32.71 kg/m     Assessments/PLAN:   DIABETES: Mild and diagnosed on glucose tolerance test and he is on metformin  A1c again norma and nonfasting glucose normal also He has gained weight but is only now starting to get back into some exercise understand the importance of weight loss   HYPOTHYROIDISM treated with 150mcg He is taking generic levothyroxine and TSH will be checked today  DEPRESSION/anxiety: He has some anxiety with using Pristiq but depression is controlled  Anxiety may be better controlled by using a beta blocker  Decreased libido/low testosterone: His testosterone level is improved with low-dose clomiphene and will continue, has less issues with libido and ED   Hypertension: Blood pressure is Fairly well controlled but he appears to be getting mild sinus tachycardia and some anxiety symptoms now May do better with going back to bisoprolol HCT instead of Cardura   BENIGN prostatic hypertrophy.  He appears to be getting more lower urinary tract obstructive symptoms He will change [facility] to [facility] Recommended urology consultation but he wants to wait  Patient Instructions  Stop Doxazosin and start Bisoprolol   Boby Eyer 10/22/2016, 9:55 AM

## 2016-10-23 ENCOUNTER — Other Ambulatory Visit: Payer: Self-pay

## 2016-10-23 ENCOUNTER — Telehealth: Payer: Self-pay | Admitting: Endocrinology

## 2016-10-23 MED ORDER — LEVOTHYROXINE SODIUM 150 MCG PO TABS: 150.0000 ug | ORAL_TABLET | Freq: Every day | ORAL | 1 refills | Status: DC

## 2016-10-23 MED FILL — BISOPROLOL-HCTZ 5-6.25 MG T: 5-6.25 | 90 days supply | Qty: 90 | Fill #0

## 2016-10-23 MED FILL — LEVOTHYROXINE 150 MCG TAB: 150 | 90 days supply | Qty: 90 | Fill #0

## 2016-10-23 MED FILL — TAMSULOSIN HCL 0.4 MG CAP: 0.4 | 30 days supply | Qty: 30 | Fill #0

## 2016-10-23 NOTE — Telephone Encounter (Signed)
Refill of  levothyroxine (SYNTHROID, LEVOTHROID) 150 MCG tablet 90 tablet   [place], [place] - [address] [phone] (Phone) [phone] (Fax)

## 2016-10-23 NOTE — Progress Notes (Signed)
Please let patient know that the thyroid result is normal and no further action needed

## 2016-10-23 NOTE — Telephone Encounter (Signed)
Ordered

## 2016-11-13 ENCOUNTER — Telehealth: Payer: Self-pay | Admitting: Endocrinology

## 2016-11-13 NOTE — Telephone Encounter (Signed)
Dr. Dwyane Dee, is this okay to refill? I see the note that says you discontinued this Rx in April 2018? Wanted to check first. Please advise?

## 2016-11-13 NOTE — Telephone Encounter (Signed)
Yes we had discontinued doxazosin

## 2016-11-13 NOTE — Telephone Encounter (Signed)
Thank you! I will refuse this request.

## 2016-11-20 ENCOUNTER — Telehealth: Payer: Self-pay | Admitting: Endocrinology

## 2016-11-20 MED FILL — TAMSULOSIN HCL 0.4 MG CAP: 0.4 | 30 days supply | Qty: 30 | Fill #1

## 2016-11-20 NOTE — Telephone Encounter (Signed)
Pt needs his Desvenlafaxine and Metformin both refilled and sent to the [facility].

## 2016-11-23 MED ORDER — METFORMIN HCL ER 500 MG PO TB24: 1000.0000 mg | ORAL_TABLET | Freq: Every day | ORAL | 3 refills | Status: DC

## 2016-11-23 MED ORDER — DESVENLAFAXINE SUCCINATE ER 100 MG PO TB24: ORAL_TABLET | ORAL | 3 refills | Status: DC

## 2016-11-23 MED FILL — METFORMIN HCL ER 500 MG TAB: 500 | 90 days supply | Qty: 180 | Fill #0

## 2016-11-23 MED FILL — DESVENLAFAXINE SUC ER 100 M: 100 | 30 days supply | Qty: 30 | Fill #0

## 2016-11-23 NOTE — Telephone Encounter (Signed)
Message not viewed until 11/23/2016. Rx submitted.

## 2016-11-24 ENCOUNTER — Other Ambulatory Visit: Payer: Self-pay

## 2016-11-24 ENCOUNTER — Telehealth: Payer: Self-pay | Admitting: Endocrinology

## 2016-11-24 MED ORDER — DESVENLAFAXINE SUCCINATE ER 100 MG PO TB24: ORAL_TABLET | ORAL | 3 refills | Status: DC

## 2016-11-24 NOTE — Telephone Encounter (Signed)
Called patient's wife Mark Black and let her know we could try a 30 day prescription to see if this will be cheaper for them. Sending the new Rx to the pharmacy.

## 2016-11-24 NOTE — Telephone Encounter (Signed)
Pt's wife called in and said that when she went to pick up the Generic Pristique, that the price for 3 months was about $140 or so, she wanted to know if there is any other alternative that may be cheaper or if this is going to be the best bet.  She said you may call her back.

## 2016-11-25 MED FILL — DOXYCYCLINE HYCLATE 100 MG: 100 | 1 days supply | Qty: 2 | Fill #0

## 2016-11-28 ENCOUNTER — Other Ambulatory Visit: Payer: Self-pay | Admitting: Endocrinology

## 2016-12-25 MED FILL — TAMSULOSIN HCL 0.4 MG CAP: 0.4 | 30 days supply | Qty: 30 | Fill #2

## 2016-12-26 ENCOUNTER — Other Ambulatory Visit: Payer: Self-pay | Admitting: Endocrinology

## 2016-12-29 ENCOUNTER — Telehealth: Payer: Self-pay | Admitting: Endocrinology

## 2016-12-29 ENCOUNTER — Other Ambulatory Visit: Payer: Self-pay

## 2016-12-29 MED ORDER — AMLODIPINE BESY-BENAZEPRIL HCL 10-40 MG PO CAPS: 1.0000 | ORAL_CAPSULE | Freq: Every day | ORAL | 1 refills | Status: DC

## 2016-12-29 MED FILL — DESVENLAFAXINE SUC ER 100 M: 100 | 30 days supply | Qty: 30 | Fill #1

## 2016-12-29 NOTE — Telephone Encounter (Signed)
**  Remind patient they can make refill requests via MyChart**  Medication refill request (Name & Dosage):  amLODipine-benazepril (LOTREL) 10-40 MG capsule OR LOTREL (whichever is less expensive)  Preferred pharmacy (Name & Address):  [place], [place] - [address] [phone] (Phone) [phone] (Fax)    Other comments (if applicable):   Patient's wife now has the [facility] insurance and needs to know if the Lotrel is less expensive at the pharmacy. Please advise patient's wife before filling. Okay to leave a detailed message.

## 2016-12-29 NOTE — Telephone Encounter (Signed)
Called patients wife Anderson Malta and let her know that I sent in the generic Lotrel to the [facility] and she will need to call to verify price with them versus [facility].

## 2017-01-22 ENCOUNTER — Other Ambulatory Visit (INDEPENDENT_AMBULATORY_CARE_PROVIDER_SITE_OTHER): Payer: 59

## 2017-01-22 DIAGNOSIS — E291 Testicular hypofunction: Secondary | ICD-10-CM

## 2017-01-22 DIAGNOSIS — R7301 Impaired fasting glucose: Secondary | ICD-10-CM | POA: Diagnosis not present

## 2017-01-22 DIAGNOSIS — E063 Autoimmune thyroiditis: Secondary | ICD-10-CM

## 2017-01-22 DIAGNOSIS — I10 Essential (primary) hypertension: Secondary | ICD-10-CM | POA: Diagnosis not present

## 2017-01-22 DIAGNOSIS — N139 Obstructive and reflux uropathy, unspecified: Secondary | ICD-10-CM

## 2017-01-22 LAB — COMPREHENSIVE METABOLIC PANEL
ALT: 42 U/L (ref 0–53)
AST: 35 U/L (ref 0–37)
Albumin: 4.4 g/dL (ref 3.5–5.2)
Alkaline Phosphatase: 40 U/L (ref 39–117)
BUN: 20 mg/dL (ref 6–23)
CO2: 26 meq/L (ref 19–32)
Calcium: 9.6 mg/dL (ref 8.4–10.5)
Chloride: 102 mEq/L (ref 96–112)
Creatinine, Ser: 0.85 mg/dL (ref 0.40–1.50)
GFR: 100.2 mL/min (ref >60.00)
GLUCOSE: 143 mg/dL — AB (ref 70–99)
Potassium: 4.1 mEq/L (ref 3.5–5.1)
SODIUM: 137 meq/L (ref 135–145)
Total Bilirubin: 0.7 mg/dL (ref 0.2–1.2)
Total Protein: 6.7 g/dL (ref 6.0–8.3)

## 2017-01-22 LAB — LIPID PANEL
CHOLESTEROL: 83 mg/dL (ref 0–200)
HDL: 39.3 mg/dL (ref >39.00)
LDL Cholesterol: 20 mg/dL (ref 0–99)
NonHDL: 43.64
TRIGLYCERIDES: 117 mg/dL (ref 0.0–149.0)
Total CHOL/HDL Ratio: 2
VLDL: 23.4 mg/dL (ref 0.0–40.0)

## 2017-01-22 LAB — CBC
HEMATOCRIT: 44 % (ref 39.0–52.0)
Hemoglobin: 15.4 g/dL (ref 13.0–17.0)
MCHC: 35.1 g/dL (ref 30.0–36.0)
MCV: 87.6 fl (ref 78.0–100.0)
PLATELETS: 176 10*3/uL (ref 150.0–400.0)
RBC: 5.02 Mil/uL (ref 4.22–5.81)
RDW: 13.6 % (ref 11.5–15.5)
WBC: 6.3 10*3/uL (ref 4.0–10.5)

## 2017-01-22 LAB — TESTOSTERONE: Testosterone: 272.3 ng/dL — ABNORMAL LOW (ref 300.00–890.00)

## 2017-01-22 LAB — TSH: TSH: 1.99 u[IU]/mL (ref 0.35–4.50)

## 2017-01-22 MED FILL — LEVOTHYROXINE 150 MCG TAB: 150 | 90 days supply | Qty: 90 | Fill #1

## 2017-01-25 MED FILL — BISOPROLOL-HCTZ 5-6.25 MG T: 5-6.25 | 90 days supply | Qty: 90 | Fill #1

## 2017-01-25 MED FILL — TAMSULOSIN HCL 0.4 MG CAP: 0.4 | 30 days supply | Qty: 30 | Fill #3

## 2017-01-25 MED FILL — DESVENLAFAXINE SUC ER 100 M: 100 | 30 days supply | Qty: 30 | Fill #2

## 2017-01-25 NOTE — Progress Notes (Signed)
Patient ID: Mark Black, male   DOB: [birth year], 51 y.o.   MRN: [id number]   Reason for Appointment:  COMPLETE  physical exam and follow-up of various problems    History of Present Illness:   DIABETES: This has been mild and diagnosed on his glucose tolerance test He has a family history of diabetes and his fasting glucose has been high since 2011 when it was 109.    GTT in 12/13 showed FASTING glucose of 121, two-hour reading of 245. He was started on metformin ER 1000 mg after the GTT results    He has been advised to be consistent in his efforts to lose weight and is generally watching his diet   However his compliance with diet and exercise has been variable He more recently has not been able to find time for exercise even though he has a treadmill at home He is trying to eat more salads but not always making good choices when eating out at lunch  His weight has gone up about 10 pounds over the last year   FASTING blood sugar is now 143; previously range 101-127.   A1c is usually below the prediabetic range   Wt Readings from Last 3 Encounters:  01/26/17 232 lb 12.8 oz (105.6 kg)  10/22/16 228 lb (103.4 kg)  11/19/15 222 lb (100.7 kg)    Lab Results  Component Value Date   HGBA1C 5.4 10/19/2016   HGBA1C 5.2 08/19/2015   HGBA1C 5.0 05/17/2015   Lab Results  Component Value Date   MICROALBUR <0.7 01/15/2015   [place] 20 01/22/2017   CREATININE 0.85 01/22/2017    Hypertension:    He has had long-standing hypertension  Does Not recently monitor his blood pressure at home He has been on 5 mg Ziac since 2013 in addition to maximum dose Lotrel   Blood pressure is fairly good today   BP Readings from Last 3 Encounters:  01/26/17 (!) 128/92  10/22/16 128/76  11/19/15 126/74     Hypothyroidism:   The hypothyroidism was first diagnosed In 1996 with weight gain and feeling sluggish   The treatments that the patient has taken include Synthroid  brand name and Subsequently levothyroxine Compliance with the medical regimen has been as prescribed with taking the tablet in the morning before breakfast.  He states he is feeling more tired or weak recently   TSH is consistently normal now and he is taking 150 g   Lab Results  Component Value Date   TSH 1.99 01/22/2017   TSH 1.42 10/22/2016   TSH 2.69 11/14/2015   FREET4 1.13 12/11/2013   FREET4 1.27 06/06/2013    DEPRESSION: Has history of long-standing of depression, he has been on 100 mg of Pristiq with good control of his anxiety and depression At times he may have little irritability  He still tends to have mild late insomnia partly related to getting up because of nocturia      HYPOGONADISM:  He has had fatigue and decreased libido for the last few years His evaluation had shown and normal LH and prolactin levels along with significantly decreased free testosterone  He had been empirically started on clomiphene 25 mg twice a week in 11/2015 and with this his energy level and testosterone level had improved significantly On his own he has stopped taking the clomiphene and more recently has been feeling more tired, weak and still has decreased motivation, some irritability and decreased libido  Lab Results  Component  Value Date   TESTOSTERONE 272.30 (L) 01/22/2017   TESTOSTERONE 643.8 10/19/2016   TESTOSTERONE 465 11/14/2015    LABS:  Lab on 01/22/2017  Component Date Value Ref Range Status  . Sodium 01/22/2017 137  135 - 145 mEq/L Final  . Potassium 01/22/2017 4.1  3.5 - 5.1 mEq/L Final  . Chloride 01/22/2017 102  96 - 112 mEq/L Final  . CO2 01/22/2017 26  19 - 32 mEq/L Final  . Glucose, Bld 01/22/2017 143* 70 - 99 mg/dL Final  . BUN 01/22/2017 20  6 - 23 mg/dL Final  . Creatinine, Ser 01/22/2017 0.85  0.40 - 1.50 mg/dL Final  . Total Bilirubin 01/22/2017 0.7  0.2 - 1.2 mg/dL Final  . Alkaline Phosphatase 01/22/2017 40  39 - 117 U/L Final  . AST 01/22/2017  35  0 - 37 U/L Final  . ALT 01/22/2017 42  0 - 53 U/L Final  . Total Protein 01/22/2017 6.7  6.0 - 8.3 g/dL Final  . Albumin 01/22/2017 4.4  3.5 - 5.2 g/dL Final  . Calcium 01/22/2017 9.6  8.4 - 10.5 mg/dL Final  . GFR 01/22/2017 100.20  >60.00 mL/min Final  . Cholesterol 01/22/2017 83  0 - 200 mg/dL Final   ATP III Classification       Desirable:  < 200 mg/dL               Borderline High:  200 - 239 mg/dL          High:  > = 240 mg/dL  . Triglycerides 01/22/2017 117.0  0.0 - 149.0 mg/dL Final   Normal:  <150 mg/dLBorderline High:  150 - 199 mg/dL  . HDL 01/22/2017 39.30  >39.00 mg/dL Final  . VLDL 01/22/2017 23.4  0.0 - 40.0 mg/dL Final  . LDL Cholesterol 01/22/2017 20  0 - 99 mg/dL Final  . Total CHOL/HDL Ratio 01/22/2017 2   Final                  Men          Women1/2 Average Risk     3.4          3.3Average Risk          5.0          4.42X Average Risk          9.6          7.13X Average Risk          15.0          11.0                      . NonHDL 01/22/2017 43.64   Final   NOTE:  Non-HDL goal should be 30 mg/dL higher than patient's LDL goal (i.e. LDL goal of < 70 mg/dL, would have non-HDL goal of < 100 mg/dL)  . TSH 01/22/2017 1.99  0.35 - 4.50 uIU/mL Final  . Testosterone 01/22/2017 272.30* 300.00 - 890.00 ng/dL Final  . WBC 01/22/2017 6.3  4.0 - 10.5 K/uL Final  . RBC 01/22/2017 5.02  4.22 - 5.81 Mil/uL Final  . Platelets 01/22/2017 176.0  150.0 - 400.0 K/uL Final  . Hemoglobin 01/22/2017 15.4  13.0 - 17.0 g/dL Final  . HCT 01/22/2017 44.0  39.0 - 52.0 % Final  . MCV 01/22/2017 87.6  78.0 - 100.0 fl Final  . MCHC 01/22/2017 35.1  30.0 - 36.0 g/dL Final  . RDW 01/22/2017 13.6  11.5 - 15.5 % Final    Allergies as of 01/26/2017   No Known Allergies     Medication List       Accurate as of 01/26/17  9:02 AM. Always use your most recent med list.          amLODipine-benazepril 10-40 MG capsule Commonly known as:  LOTREL Take 1 capsule by mouth daily.     bisoprolol-hydrochlorothiazide 5-6.25 MG tablet Commonly known as:  ZIAC Take 1 tablet by mouth daily.   clomiPHENE 50 MG tablet Commonly known as:  CLOMID 1/2 pill twice weekly   desvenlafaxine 100 MG 24 hr tablet Commonly known as:  PRISTIQ TAKE 1 TABLET (100 MG TOTAL) BY MOUTH DAILY.   levothyroxine 150 MCG tablet Commonly known as:  SYNTHROID, LEVOTHROID Take 1 tablet (150 mcg total) by mouth daily.   metFORMIN 500 MG 24 hr tablet Commonly known as:  GLUCOPHAGE-XR Take 2 tablets (1,000 mg total) by mouth daily with supper.   methocarbamol 750 MG tablet Commonly known as:  ROBAXIN Take 1 tablet (750 mg total) by mouth 3 (three) times daily.   MULTIVITAMIN ADULT PO Take by mouth.   tamsulosin 0.4 MG Caps capsule Commonly known as:  FLOMAX Take 1 capsule (0.4 mg total) by mouth daily after supper.   triamcinolone cream 0.1 % Commonly known as:  KENALOG Apply 1 application topically 2 (two) times daily.   vitamin B-12 1000 MCG tablet Commonly known as:  CYANOCOBALAMIN Take 1,000 mcg by mouth daily.       Past Medical History:  Diagnosis Date  . Alopecia totalis   . Hypertension   . Vitiligo     Past Surgical History:  Procedure Laterality Date  . CARPAL TUNNEL RELEASE      Family History  Problem Relation Age of Onset  . Diabetes Father   . Hypertension Father   . Diabetes Paternal Aunt   . Cancer Maternal Aunt        breast    Social History:  reports that he has never smoked. He has never used smokeless tobacco. His alcohol and drug histories are not on file.   Allergies: No Known Allergies  REVIEW of systems:.   Also has alopecia totalis except some hair on his forearms and face  No history of hyperlipidemia. He is total cholesterol is below 100 but also has a low HDL  Lab Results  Component Value Date   CHOL 83 01/22/2017   HDL 39.30 01/22/2017   LDLCALC 20 01/22/2017   TRIG 117.0 01/22/2017   CHOLHDL 2 01/22/2017        Eyes:  Has not had eye exam for several years Despite reminders.  Vision is normal                        No unusual headaches.     ENT: Has recurrent problems with seasonal allergic rhinitis, complaining of running nose and sneezing and some stuffy sinuses. On his own he is only taking Mucinex D as needed, previously had taken Nasonex and Clarinex     Skin:  He has had progressive alopecia since about 1996 now involving almost all body hair.    Also has long-standing vitiligo which is generalized   No chest pain on exertion.                 No palpitations.   No swelling of feet.     No shortness of breath on  exertion.     Bowel habits: No change, he says that he is generally going 3 times a day and stools are slightly loose now he is increasing his fiber and eating more salads..                    Heartburn/pain: Occasional heartburn with certain foods        No joint pains.      No numbness or tingling in hands or feet; no weakness in limbs.     He has nocturia 2 times.  Continues to have hesitancy along with at times slow urine flow His symptoms are only slightly better with using Flomax since his last visit     Examination:   BP (!) 128/92   Pulse 78   Ht 5\' 10"  (1.778 m)   Wt 232 lb 12.8 oz (105.6 kg)   SpO2 97%   BMI 33.40 kg/m    Repeat blood pressure on left arm: 134/92  Physical Exam  Constitutional: He appears well-developed and well-nourished.  HENT:  Mouth/Throat: Oropharynx is clear and moist.  Eyes: Conjunctivae are normal.  Fundus exam shows no exudates, hemorrhages or abnormalities of the disc  Neck: No thyromegaly present.  No carotid bruit  Cardiovascular: Normal rate, regular rhythm and normal heart sounds.  Exam reveals no gallop.   No murmur heard. Pulmonary/Chest: Breath sounds normal. He has no wheezes. He has no rales.  Small amount of gynecomastia on the right and more significant on the left, about 3 cm firm  Abdominal: Soft. He exhibits no  distension and no mass. There is no tenderness. No hernia.  Genitourinary: Rectum normal. Prostate is enlarged. Prostate is not tender. Left testis shows swelling.  Genitourinary Comments: Mild generalized swelling of the left testicle, smooth and normal texture without tenderness.  Right testicle  smaller than normal about 2.5-3 cm, much softer 1+ prostate enlargement, unable to reach and palpate the entire gland  Musculoskeletal: He exhibits no edema.  Lymphadenopathy:    He has no cervical adenopathy.  Neurological: He has normal reflexes. He displays normal reflexes. No cranial nerve deficit. He exhibits normal muscle tone.  Vibration sense mildly reduced  Skin: Rash noted. No pallor.  Has generalized alopecia including eyebrows and body hair Extensive areas of vitiligo all over body interspersed with some brownish patchy areas  Psychiatric: He has a normal mood and affect.  Vitals reviewed.     Assessments/PLAN:   DIABETES: Mild and diagnosed on glucose tolerance test and he is on 1000 mg metformin  A1c has been consistently normal below 5.7  Even with taking metformin recently his fasting glucose is now 143 which is indicating some progression of his impaired fasting glucose This is likely to be from lack of exercise and weight gain Discussed importance of regular exercise, he is not taking out the time to do this Will check A1c again on the follow-up   HYPOTHYROIDISM treated with 126mcg levothyroxine He is taking this consistently His fatigue is unlikely related to this TSH normal now  DEPRESSION: He has fairly good control with the Pristiq mild mood changes and irritability may be related to hypogonadism  Decreased libido and HYPOGONADISM:   His free testosterone was low previously and 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  He appears to be mildly symptomatic from this, has a smooth and mildly enlarged prostate on exam. He has not benefited from using Flomax and will get urology consultation  Left testicular enlargement and intermittent pain: Will also consult neurology for this  ALLERGIC rhinitis: This is seasonal and he has not used nasal steroids or OTC antihistamines, discussed management in detail   PREVENTIVE care:   He will get tetanus/diphtheria.vaccine today  Start regular exercise program with walking on treadmill or other activities  Continue prudent diet Diet   He agrees to go for a screening colonoscopy  He will schedule eye exam  Discussed safety at home and while driving  No indication for prophylactic aspirin  Sunscreen protection  Enrolled in the wellness program  Continue regular dental exams  Other recommendations:  Urology consultation for persistent urinary tract symptoms, testicular discomfort  Follow-up in 3 months  Restart clomiphene twice a week Recheck diabetes and testosterone in 3 months  Flonase and Clarinex OTC for allergic rhinitis    Patient Instructions  Get eye exam complete  Get Flonase or similar spray for allergy and use daily plus Clarinex or Allegra as needed  Start walking on treadmill at least every other day, work up to 40 minutes daily Exercise is important for your diabetes control and other factors  CLOMIPHENE: Start taking this after that twice a week regularly again  We will refer you for colonoscopy and urology consultation  for urinary/testicular symptoms  Regular dental exams   Remember to have preventive measures daily with using seatbelts, smoke alarms at home, sunscreen protection when outdoors consistently     [facility] 01/26/2017, 9:02 AM

## 2017-01-26 ENCOUNTER — Encounter: Payer: Self-pay | Admitting: Endocrinology

## 2017-01-26 ENCOUNTER — Ambulatory Visit (INDEPENDENT_AMBULATORY_CARE_PROVIDER_SITE_OTHER): Payer: 59 | Admitting: Endocrinology

## 2017-01-26 VITALS — BP 122/82 | HR 78 | Ht 70.0 in | Wt 232.8 lb

## 2017-01-26 DIAGNOSIS — E291 Testicular hypofunction: Secondary | ICD-10-CM

## 2017-01-26 DIAGNOSIS — Z0000 Encounter for general adult medical examination without abnormal findings: Secondary | ICD-10-CM | POA: Diagnosis not present

## 2017-01-26 DIAGNOSIS — Z1211 Encounter for screening for malignant neoplasm of colon: Secondary | ICD-10-CM | POA: Diagnosis not present

## 2017-01-26 DIAGNOSIS — R399 Unspecified symptoms and signs involving the genitourinary system: Secondary | ICD-10-CM | POA: Diagnosis not present

## 2017-01-26 DIAGNOSIS — I10 Essential (primary) hypertension: Secondary | ICD-10-CM | POA: Diagnosis not present

## 2017-01-26 DIAGNOSIS — E119 Type 2 diabetes mellitus without complications: Secondary | ICD-10-CM

## 2017-01-26 DIAGNOSIS — E063 Autoimmune thyroiditis: Secondary | ICD-10-CM | POA: Diagnosis not present

## 2017-01-26 DIAGNOSIS — Z23 Encounter for immunization: Secondary | ICD-10-CM

## 2017-01-26 MED ORDER — CLOMIPHENE CITRATE 50 MG PO TABS: ORAL_TABLET | ORAL | 2 refills | Status: DC

## 2017-01-26 MED ORDER — METFORMIN HCL ER 500 MG PO TB24: 1500.0000 mg | ORAL_TABLET | Freq: Every day | ORAL | 3 refills | Status: DC

## 2017-01-26 MED FILL — CLOMIPHENE CITRATE 50 MG TA: 50 | 70 days supply | Qty: 10 | Fill #0

## 2017-01-26 NOTE — Patient Instructions (Addendum)
Get eye exam complete  Get Flonase or similar spray for allergy and use daily plus Clarinex or Allegra as needed  Start walking on treadmill at least every other day, work up to 40 minutes daily Exercise is important for your diabetes control and other factors  CLOMIPHENE: Start taking this after that twice a week regularly again  We will refer you for colonoscopy and urology consultation for urinary/testicular symptoms  Regular dental exams   Remember to have preventive measures daily with using seatbelts, smoke alarms at home, sunscreen protection when outdoors consistently  Please enroll in the link to wellness program at [facility]

## 2017-02-02 MED FILL — AMLODIPINE-BENAZ 10-40 MG: 10-40 | 90 days supply | Qty: 90 | Fill #0

## 2017-03-01 ENCOUNTER — Encounter: Payer: Self-pay | Admitting: Endocrinology

## 2017-03-03 ENCOUNTER — Other Ambulatory Visit: Payer: Self-pay | Admitting: Endocrinology

## 2017-03-03 MED FILL — METFORMIN HCL ER 500 MG TAB: 500 | 90 days supply | Qty: 180 | Fill #1

## 2017-03-03 MED FILL — DESVENLAFAXINE SUC ER 100 M: 100 | 30 days supply | Qty: 30 | Fill #3

## 2017-03-03 MED FILL — TAMSULOSIN HCL 0.4 MG CAP: 0.4 | 30 days supply | Qty: 30 | Fill #0

## 2017-04-01 MED FILL — DESVENLAFAXINE SUC ER 100 M: 100 | 30 days supply | Qty: 30 | Fill #4

## 2017-04-01 MED FILL — TAMSULOSIN HCL 0.4 MG CAP: 0.4 | 30 days supply | Qty: 30 | Fill #1

## 2017-04-23 ENCOUNTER — Other Ambulatory Visit (INDEPENDENT_AMBULATORY_CARE_PROVIDER_SITE_OTHER): Payer: 59

## 2017-04-23 DIAGNOSIS — E063 Autoimmune thyroiditis: Secondary | ICD-10-CM | POA: Diagnosis not present

## 2017-04-23 DIAGNOSIS — E291 Testicular hypofunction: Secondary | ICD-10-CM | POA: Diagnosis not present

## 2017-04-23 DIAGNOSIS — E119 Type 2 diabetes mellitus without complications: Secondary | ICD-10-CM

## 2017-04-23 LAB — TSH: TSH: 3.38 u[IU]/mL (ref 0.35–4.50)

## 2017-04-23 LAB — BASIC METABOLIC PANEL
BUN: 15 mg/dL (ref 6–23)
CHLORIDE: 104 meq/L (ref 96–112)
CO2: 27 mEq/L (ref 19–32)
Calcium: 8.4 mg/dL (ref 8.4–10.5)
Creatinine, Ser: 0.8 mg/dL (ref 0.40–1.50)
GFR: 107.36 mL/min (ref >60.00)
GLUCOSE: 130 mg/dL — AB (ref 70–99)
POTASSIUM: 4.2 meq/L (ref 3.5–5.1)
Sodium: 139 mEq/L (ref 135–145)

## 2017-04-23 LAB — HEMOGLOBIN A1C: Hgb A1c MFr Bld: 5.6 % (ref 4.6–6.5)

## 2017-04-23 LAB — LUTEINIZING HORMONE: LH: 7.02 m[IU]/mL (ref 1.50–9.30)

## 2017-04-23 LAB — TESTOSTERONE: Testosterone: 390.14 ng/dL (ref 300.00–890.00)

## 2017-04-26 ENCOUNTER — Telehealth: Payer: Self-pay | Admitting: Endocrinology

## 2017-04-26 ENCOUNTER — Other Ambulatory Visit: Payer: Self-pay

## 2017-04-26 MED ORDER — LEVOTHYROXINE SODIUM 150 MCG PO TABS: 150.0000 ug | ORAL_TABLET | Freq: Every day | ORAL | 1 refills | Status: DC

## 2017-04-26 MED FILL — LEVOTHYROXINE 150 MCG TAB: 150 | 90 days supply | Qty: 90 | Fill #0

## 2017-04-26 NOTE — Telephone Encounter (Signed)
Called patient and left a voice message to let him know that I have already sent over the Levothyroxine to the [facility] for him.   Please see his bottom note and advise.

## 2017-04-26 NOTE — Telephone Encounter (Signed)
Pt [facility]  Also on the mychart there are things that are overdue can these be addressed at next visit please

## 2017-04-28 ENCOUNTER — Ambulatory Visit (INDEPENDENT_AMBULATORY_CARE_PROVIDER_SITE_OTHER): Payer: 59 | Admitting: Endocrinology

## 2017-04-28 ENCOUNTER — Encounter: Payer: Self-pay | Admitting: Endocrinology

## 2017-04-28 VITALS — BP 126/86 | HR 78 | Ht 70.0 in | Wt 230.6 lb

## 2017-04-28 DIAGNOSIS — E063 Autoimmune thyroiditis: Secondary | ICD-10-CM

## 2017-04-28 DIAGNOSIS — E119 Type 2 diabetes mellitus without complications: Secondary | ICD-10-CM

## 2017-04-28 DIAGNOSIS — I10 Essential (primary) hypertension: Secondary | ICD-10-CM

## 2017-04-28 DIAGNOSIS — E291 Testicular hypofunction: Secondary | ICD-10-CM

## 2017-04-28 MED FILL — DESVENLAFAXINE SUC ER 100 M: 100 | 30 days supply | Qty: 30 | Fill #5

## 2017-04-28 NOTE — Patient Instructions (Signed)
Set up colonoscopy  Exercise daily

## 2017-04-28 NOTE — Progress Notes (Signed)
Patient ID: Mark Black, male   DOB: December 24, 1965, 51 y.o.   MRN: [id number]   Reason for Appointment:   follow-up of various problems    History of Present Illness:   DIABETES: This has been mild and diagnosed on his glucose tolerance test He has a family history of diabetes and his fasting glucose has been high since 2011 when it was 109.    GTT in 12/13 showed FASTING glucose of 121, two-hour reading of 245. He was started on metformin ER 1000 mg after the GTT results    He has been advised to be consistent in his efforts to lose weight and to be watching his diet   However his compliance with diet and exercise has been variable Again has not been able to find time for exercise;  he has a treadmill at home He is trying to eat more salads, less fast food now and weight is just a little better   FASTING blood sugar is now 130; previously range 101-127.   A1c is usually below the prediabetic range although gradually increasing   Wt Readings from Last 3 Encounters:  04/28/17 230 lb 9.6 oz (104.6 kg)  01/26/17 232 lb 12.8 oz (105.6 kg)  10/22/16 228 lb (103.4 kg)    Lab Results  Component Value Date   HGBA1C 5.6 04/23/2017   HGBA1C 5.4 10/19/2016   HGBA1C 5.2 08/19/2015   Lab Results  Component Value Date   MICROALBUR <0.7 01/15/2015   [place] 20 01/22/2017   CREATININE 0.80 04/23/2017    Hypertension:    He has had long-standing hypertension   Does not monitor his blood pressure at home He has been on 5 mg Ziac since 2013 in addition to maximum dose Lotrel   Blood pressure is fairly good today   BP Readings from Last 3 Encounters:  04/28/17 126/86  01/26/17 122/82  10/22/16 128/76     Hypothyroidism:   The hypothyroidism was first diagnosed In 1996 with weight gain and feeling sluggish   The treatments that the patient has taken include Synthroid brand name and Subsequently levothyroxine Compliance with the medical regimen has been as  prescribed with taking the tablet in the morning before breakfast. He feels fairly good with energy level, may have fatigue for other reasons at times   TSH is consistently normal now and he is taking 150 g   Lab Results  Component Value Date   TSH 3.38 04/23/2017   TSH 1.99 01/22/2017   TSH 1.42 10/22/2016   FREET4 1.13 12/11/2013   FREET4 1.27 06/06/2013    DEPRESSION: Has history of long-standing of depression, he has been on 100 mg of Pristiq with good control of his anxiety and depression  He still tends to have mild late insomnia partly related to getting up because of nocturia      HYPOGONADISM:  He has had fatigue and decreased libido for the last few years His evaluation had shown and normal LH and prolactin levels along with significantly decreased free testosterone  He had been empirically started on clomiphene 25 mg twice a week in 11/2015 and with this his energy level and testosterone level had improved significantly; however he had left off the medication before his visit in July and had been feeling more tired, weak with low motivation and decreased libido He thinks his energy level is now better with going back on clomiphene Testosterone level is significantly improved compared to July  Lab Results  Component Value  Date   TESTOSTERONE 390.14 04/23/2017   TESTOSTERONE 272.30 (L) 01/22/2017   TESTOSTERONE 643.8 10/19/2016   TESTOSTERONE 465 11/14/2015    LABS:  Lab on 04/23/2017  Component Date Value Ref Range Status  . Hgb A1c MFr Bld 04/23/2017 5.6  4.6 - 6.5 % Final   Glycemic Control Guidelines for People with Diabetes:Non Diabetic:  <6%Goal of Therapy: <7%Additional Action Suggested:  >8%   . Sodium 04/23/2017 139  135 - 145 mEq/L Final  . Potassium 04/23/2017 4.2  3.5 - 5.1 mEq/L Final  . Chloride 04/23/2017 104  96 - 112 mEq/L Final  . CO2 04/23/2017 27  19 - 32 mEq/L Final  . Glucose, Bld 04/23/2017 130* 70 - 99 mg/dL Final  . BUN 04/23/2017 15   6 - 23 mg/dL Final  . Creatinine, Ser 04/23/2017 0.80  0.40 - 1.50 mg/dL Final  . Calcium 04/23/2017 8.4  8.4 - 10.5 mg/dL Final  . GFR 04/23/2017 107.36  >60.00 mL/min Final  . Testosterone 04/23/2017 390.14  300.00 - 890.00 ng/dL Final  . TSH 04/23/2017 3.38  0.35 - 4.50 uIU/mL Final  . LH 04/23/2017 7.02  1.50 - 9.30 mIU/mL Final   Comment: Male Reference Range:20-70 yrs     1.5-9.3 mIU/mL>70 yrs       3.1-35.6 mIU/mLFemale Reference Range:Follicular Phase     3.2-20.2 mIU/mLMidcycle             8.7-76.3 mIU/mLLuteal Phase         0.5-16.9 mIU/mL  Post Menopausal      15.9-54.0  mIU/mLPregnant             <1.5 mIU/mLContraceptives       0.7-5.6 mIU/mL     Allergies as of 04/28/2017   No Known Allergies     Medication List       Accurate as of 04/28/17 11:59 PM. Always use your most recent med list.          amLODipine-benazepril 10-40 MG capsule Commonly known as:  LOTREL Take 1 capsule by mouth daily.   bisoprolol-hydrochlorothiazide 5-6.25 MG tablet Commonly known as:  ZIAC Take 1 tablet by mouth daily.   clomiPHENE 50 MG tablet Commonly known as:  CLOMID 1/2 pill twice weekly   desvenlafaxine 100 MG 24 hr tablet Commonly known as:  PRISTIQ TAKE 1 TABLET (100 MG TOTAL) BY MOUTH DAILY.   levothyroxine 150 MCG tablet Commonly known as:  SYNTHROID, LEVOTHROID Take 1 tablet (150 mcg total) by mouth daily.   metFORMIN 500 MG 24 hr tablet Commonly known as:  GLUCOPHAGE-XR Take 3 tablets (1,500 mg total) by mouth daily with supper.   methocarbamol 750 MG tablet Commonly known as:  ROBAXIN Take 1 tablet (750 mg total) by mouth 3 (three) times daily.   MULTIVITAMIN ADULT PO Take by mouth.   tamsulosin 0.4 MG Caps capsule Commonly known as:  FLOMAX TAKE 1 CAPSULE (0.4 MG TOTAL) BY MOUTH DAILY AFTER SUPPER.   triamcinolone cream 0.1 % Commonly known as:  KENALOG Apply 1 application topically 2 (two) times daily.   vitamin B-12 1000 MCG tablet Commonly known  as:  CYANOCOBALAMIN Take 1,000 mcg by mouth daily.       Past Medical History:  Diagnosis Date  . Alopecia totalis   . Hypertension   . Vitiligo     Past Surgical History:  Procedure Laterality Date  . CARPAL TUNNEL RELEASE      Family History  Problem Relation Age of Onset  .  Diabetes Father   . Hypertension Father   . Diabetes Paternal Aunt   . Cancer Maternal Aunt        breast    Social History:  reports that he has never smoked. He has never used smokeless tobacco. His alcohol and drug histories are not on file.   Allergies: No Known Allergies  REVIEW of systems:.     No history of hyperlipidemia. He is total cholesterol is below 100 but also has a low HDL  Lab Results  Component Value Date   CHOL 83 01/22/2017   HDL 39.30 01/22/2017   LDLCALC 20 01/22/2017   TRIG 117.0 01/22/2017   CHOLHDL 2 01/22/2017        Eyes: Has not had eye exam for several years Despite reminders.  Vision is normal                          Examination:   BP 126/86   Pulse 78   Ht 5\' 10"  (1.778 m)   Wt 230 lb 9.6 oz (104.6 kg)   SpO2 94%   BMI 33.09 kg/m   Repeat blood pressure.  Arm with large cuff = 124/82  Physical Exam    Assessments/PLAN:   DIABETES: Mild and diagnosed on glucose tolerance test and he is on 1000 mg metformin  A1c has been consistently normal but his fasting readings tend to be high He is still not is consistent with diet and exercise as he can especially exercise He thinks he can start exercising now with joining the gym Follow-up in 4 months  HYPOTHYROIDISM treated with 163mcg levothyroxine He is taking this consistently TSH normal   Decreased libido and HYPOGONADISM:   His free testosterone was low previously and he was felt to have hypogonadotropic hypogonadism probably related to metabolic syndrome  With clomiphene his testosterone is back to normal and he subjectively is doing better with his energy and motivation, he will  continue this twice a week  Hypertension: Blood pressure is fairly well controlled He will continue bisoprolol HCT and Lotrel   Left testicular enlargement and intermittent pain: He has been reminded to call urologist for appointment as previously referred   PREVENTIVE care: He will call to schedule his colonoscopy which she has postponed    Patient Instructions  Set up colonoscopy  Exercise daily    [facility] 04/29/2017, 8:35 AM   Note: This office note was prepared with Dragon voice recognition system technology. Any transcriptional errors that result from this process are unintentional.

## 2017-05-05 MED FILL — AMLODIPINE-BENAZ 10-40 MG: 10-40 | 90 days supply | Qty: 90 | Fill #1

## 2017-05-07 ENCOUNTER — Telehealth: Payer: Self-pay | Admitting: Endocrinology

## 2017-05-07 ENCOUNTER — Other Ambulatory Visit: Payer: Self-pay

## 2017-05-07 MED FILL — CLOMIPHENE CITRATE 50 MG TA: 50 | 70 days supply | Qty: 10 | Fill #1

## 2017-05-07 NOTE — Telephone Encounter (Signed)
[facility] bis[facility]rolol

## 2017-05-10 ENCOUNTER — Other Ambulatory Visit: Payer: Self-pay

## 2017-05-10 MED ORDER — BISOPROLOL-HYDROCHLOROTHIAZIDE 5-6.25 MG PO TABS: 1.0000 | ORAL_TABLET | Freq: Every day | ORAL | 1 refills | Status: DC

## 2017-05-10 MED FILL — BISOPROLOL-HCTZ 5-6.25 MG T: 5-6.25 | 90 days supply | Qty: 90 | Fill #0

## 2017-05-10 NOTE — Telephone Encounter (Signed)
Called patient and let him know that I have sent in this prescription to the [facility] for him.

## 2017-05-12 MED FILL — TAMSULOSIN HCL 0.4 MG CAP: 0.4 | 30 days supply | Qty: 30 | Fill #2

## 2017-06-01 MED FILL — DESVENLAFAXINE SUC ER 100 M: 100 | 30 days supply | Qty: 30 | Fill #6

## 2017-06-17 ENCOUNTER — Other Ambulatory Visit: Payer: Self-pay

## 2017-06-17 ENCOUNTER — Telehealth: Payer: Self-pay | Admitting: Endocrinology

## 2017-06-17 ENCOUNTER — Other Ambulatory Visit: Payer: Self-pay | Admitting: Endocrinology

## 2017-06-17 MED ORDER — METHOCARBAMOL 750 MG PO TABS: 750.0000 mg | ORAL_TABLET | Freq: Three times a day (TID) | ORAL | 1 refills | Status: DC

## 2017-06-17 MED ORDER — METHOCARBAMOL 750 MG PO TABS: 750.0000 mg | ORAL_TABLET | Freq: Three times a day (TID) | ORAL | 1 refills | Status: DC | PRN

## 2017-06-17 MED FILL — METHOCARBAMOL 750 MG TABS: 750 | 15 days supply | Qty: 45 | Fill #0

## 2017-06-17 MED FILL — TAMSULOSIN HCL 0.4 MG CAP: 0.4 | 30 days supply | Qty: 30 | Fill #3

## 2017-06-17 MED FILL — METFORMIN HCL ER 500 MG TAB: 500 | 90 days supply | Qty: 180 | Fill #2

## 2017-06-17 NOTE — Telephone Encounter (Signed)
Called patient and spoke to his wife and she confirmed this is used for his back pain.

## 2017-06-17 NOTE — Telephone Encounter (Signed)
Confirm that this is for his back pain, refill old prescription

## 2017-06-17 NOTE — Telephone Encounter (Signed)
Pt wife Anderson Malta request new script [facility]. [phone] [place].

## 2017-06-17 NOTE — Telephone Encounter (Signed)
Please advise if okay to fill for patient?

## 2017-07-01 MED FILL — DESVENLAFAXINE SUC ER 100 M: 100 | 30 days supply | Qty: 30 | Fill #7

## 2017-07-15 ENCOUNTER — Telehealth: Payer: Self-pay | Admitting: Endocrinology

## 2017-07-15 NOTE — Telephone Encounter (Signed)
His medications have not been recalled.  He can try calling Dr. Donnie Coffin at [facility]

## 2017-07-15 NOTE — Telephone Encounter (Signed)
I contacted the patient and advised of MD's message. He voiced understanding but could not take the number for Dr. Bebe Shaggy at this time. When the patient calls back please provide the number listed below. Phone: [phone]

## 2017-07-15 NOTE — Telephone Encounter (Signed)
Pts wife called stated she Seen a recall on blood pressure medication.  Wanted to make sure his was not being recalled.  Pts wife also wants to know if Dr can give advice on the best PCP in our building for pt.

## 2017-07-15 NOTE — Telephone Encounter (Signed)
See message and please advise.  

## 2017-07-20 ENCOUNTER — Telehealth: Payer: Self-pay | Admitting: Endocrinology

## 2017-07-20 NOTE — Telephone Encounter (Signed)
The PCP Dr Dwyane Dee referred patient to is booked up until [facility] Dr Dwyane Dee recommend a different PCP for patient due to [employer]. Patient seeing Dr. Dwyane Dee in February Please call patient's wife Anderson Malta at ph# [phone] or call patient

## 2017-07-21 ENCOUNTER — Other Ambulatory Visit: Payer: Self-pay | Admitting: Endocrinology

## 2017-07-21 MED FILL — TAMSULOSIN HCL 0.4 MG CAP: 0.4 | 30 days supply | Qty: 30 | Fill #0

## 2017-07-21 MED FILL — CLOMIPHENE CITRATE 50 MG TA: 50 | 70 days supply | Qty: 10 | Fill #2

## 2017-07-21 NOTE — Telephone Encounter (Signed)
I can continue to see him until July and then he can have his physical with the new physician

## 2017-07-22 ENCOUNTER — Other Ambulatory Visit: Payer: Self-pay | Admitting: Endocrinology

## 2017-07-22 DIAGNOSIS — Z1211 Encounter for screening for malignant neoplasm of colon: Secondary | ICD-10-CM

## 2017-07-22 NOTE — Telephone Encounter (Signed)
Left detailed message and requested a call back if he had any questions

## 2017-07-22 NOTE — Telephone Encounter (Signed)
Contacted the patients wife to give the advice below- she stated an understanding and also requested Dr. Dwyane Dee refer the patient somewhere to get colonoscopy

## 2017-07-22 NOTE — Telephone Encounter (Signed)
Colonoscopy with [facility] gastroenterology has been ordered

## 2017-07-27 MED FILL — LEVOTHYROXINE 150 MCG TAB: 150 | 90 days supply | Qty: 90 | Fill #1

## 2017-08-05 ENCOUNTER — Other Ambulatory Visit: Payer: Self-pay | Admitting: Endocrinology

## 2017-08-05 MED FILL — AMLODIPINE-BENAZ 10-40 MG: 10-40 | 90 days supply | Qty: 90 | Fill #0

## 2017-08-05 MED FILL — DESVENLAFAXINE SUC ER 100 M: 100 | 30 days supply | Qty: 30 | Fill #8

## 2017-08-10 NOTE — Telephone Encounter (Signed)
Pt stated that he need to speak to someone about his medication that was sent over to the pharmacy. States that the pharmacy told him he does not fill comfortable filling it for pt because it is a big change in medication   Please advise

## 2017-08-11 MED FILL — BISOPROLOL-HCTZ 5-6.25 MG T: 5-6.25 | 90 days supply | Qty: 90 | Fill #1

## 2017-08-11 NOTE — Telephone Encounter (Signed)
Spoke to the pharmacy and the confusion was they wanted to make sure patient was supposed to be taking amlodipine and bisoprolol and according to last office note he is- this has been resolved and the patient has been notified

## 2017-08-24 MED FILL — TAMSULOSIN HCL 0.4 MG CAP: 0.4 | 30 days supply | Qty: 30 | Fill #1

## 2017-08-26 ENCOUNTER — Encounter: Payer: Self-pay | Admitting: Endocrinology

## 2017-08-26 ENCOUNTER — Other Ambulatory Visit (INDEPENDENT_AMBULATORY_CARE_PROVIDER_SITE_OTHER): Payer: No Typology Code available for payment source

## 2017-08-26 DIAGNOSIS — E063 Autoimmune thyroiditis: Secondary | ICD-10-CM

## 2017-08-26 DIAGNOSIS — E291 Testicular hypofunction: Secondary | ICD-10-CM | POA: Diagnosis not present

## 2017-08-26 DIAGNOSIS — E119 Type 2 diabetes mellitus without complications: Secondary | ICD-10-CM | POA: Diagnosis not present

## 2017-08-26 LAB — COMPREHENSIVE METABOLIC PANEL
ALBUMIN: 4.3 g/dL (ref 3.5–5.2)
ALT: 39 U/L (ref 0–53)
AST: 40 U/L — AB (ref 0–37)
Alkaline Phosphatase: 41 U/L (ref 39–117)
BILIRUBIN TOTAL: 0.9 mg/dL (ref 0.2–1.2)
BUN: 17 mg/dL (ref 6–23)
CALCIUM: 9.1 mg/dL (ref 8.4–10.5)
CO2: 28 mEq/L (ref 19–32)
CREATININE: 0.86 mg/dL (ref 0.40–1.50)
Chloride: 102 mEq/L (ref 96–112)
GFR: 98.64 mL/min (ref >60.00)
Glucose, Bld: 134 mg/dL — ABNORMAL HIGH (ref 70–99)
Potassium: 4.4 mEq/L (ref 3.5–5.1)
Sodium: 137 mEq/L (ref 135–145)
TOTAL PROTEIN: 6.6 g/dL (ref 6.0–8.3)

## 2017-08-26 LAB — TESTOSTERONE: Testosterone: 542.77 ng/dL (ref 300.00–890.00)

## 2017-08-26 LAB — HEMOGLOBIN A1C: HEMOGLOBIN A1C: 5.8 % (ref 4.6–6.5)

## 2017-08-26 LAB — TSH: TSH: 3.78 u[IU]/mL (ref 0.35–4.50)

## 2017-08-30 ENCOUNTER — Ambulatory Visit: Payer: 59 | Admitting: Endocrinology

## 2017-08-30 MED FILL — DESVENLAFAXINE SUC ER 100 M: 100 | 30 days supply | Qty: 30 | Fill #9

## 2017-09-23 ENCOUNTER — Ambulatory Visit: Payer: Self-pay | Admitting: Endocrinology

## 2017-09-29 MED FILL — TAMSULOSIN HCL 0.4 MG CAP: 0.4 | 30 days supply | Qty: 30 | Fill #2

## 2017-09-29 MED FILL — METFORMIN HCL ER 500 MG TAB: 500 | 90 days supply | Qty: 180 | Fill #3

## 2017-09-30 ENCOUNTER — Encounter: Payer: Self-pay | Admitting: Endocrinology

## 2017-09-30 ENCOUNTER — Ambulatory Visit: Payer: No Typology Code available for payment source | Admitting: Endocrinology

## 2017-09-30 VITALS — BP 118/82 | HR 80 | Wt 232.0 lb

## 2017-09-30 DIAGNOSIS — E063 Autoimmune thyroiditis: Secondary | ICD-10-CM

## 2017-09-30 DIAGNOSIS — E119 Type 2 diabetes mellitus without complications: Secondary | ICD-10-CM

## 2017-09-30 DIAGNOSIS — F32A Depression, unspecified: Secondary | ICD-10-CM

## 2017-09-30 DIAGNOSIS — E291 Testicular hypofunction: Secondary | ICD-10-CM | POA: Diagnosis not present

## 2017-09-30 DIAGNOSIS — I10 Essential (primary) hypertension: Secondary | ICD-10-CM

## 2017-09-30 DIAGNOSIS — F329 Major depressive disorder, single episode, unspecified: Secondary | ICD-10-CM | POA: Diagnosis not present

## 2017-09-30 NOTE — Progress Notes (Signed)
Patient ID: Mark Black, male   DOB: 08-07-1965, 52 y.o.   MRN: [id number]   Reason for Appointment:   follow-up of various problems    History of Present Illness:   DIABETES: This has been mild and diagnosed on his glucose tolerance test His fasting glucose has been high since 2011 when it was 109.    GTT in 12/13 showed FASTING glucose of 121, two-hour reading of 245. He was started on metformin ER 1000 mg after the GTT results    He has been advised to be consistent in his efforts to lose weight and to be watching his diet Also because of his fasting readings being consistently high he was advised to take 1500 mg of metformin ER in 2018 but he is still taking 1000 mg   However his compliance with diet and exercise has been variable Again has not been able to find time for exercise;  he has been working out of town recently His weight is about the same   FASTING blood sugar is now 134; previously range 101-130.   A1c is usually below the prediabetic range although gradually increasing   Wt Readings from Last 3 Encounters:  09/30/17 232 lb (105.2 kg)  04/28/17 230 lb 9.6 oz (104.6 kg)  01/26/17 232 lb 12.8 oz (105.6 kg)    Lab Results  Component Value Date   HGBA1C 5.8 08/26/2017   HGBA1C 5.6 04/23/2017   HGBA1C 5.4 10/19/2016   Lab Results  Component Value Date   MICROALBUR <0.7 01/15/2015   [place] 20 01/22/2017   CREATININE 0.86 08/26/2017    Hypertension:    He has had long-standing hypertension   Does not monitor his blood pressure at home He has been on 5 mg Ziac since 2013 in addition to maximum dose Lotrel   Blood pressure is fairly good today at the end of the exam   BP Readings from Last 3 Encounters:  09/30/17 118/82  04/28/17 126/86  01/26/17 122/82     Hypothyroidism:   The hypothyroidism was first diagnosed In 1996 with weight gain and feeling sluggish   The treatments that the patient has taken include Synthroid brand name  and Subsequently levothyroxine Compliance with the medical regimen has been as prescribed with taking the tablet in the morning before breakfast. He feels fairly good with energy level    TSH is consistently normal  Recently he is taking 150 g levothyroxine   Lab Results  Component Value Date   TSH 3.78 08/26/2017   TSH 3.38 04/23/2017   TSH 1.99 01/22/2017   FREET4 1.13 12/11/2013   FREET4 1.27 06/06/2013    DEPRESSION: Has history of long-standing of depression, he has been on 100 mg of Pristiq with good control of his anxiety and depression  He still has some irritability and may get agitated easily but not depressed      HYPOGONADISM:  He has had fatigue and decreased libido for the last few years His evaluation had shown and normal LH and prolactin levels along with significantly decreased free testosterone  He had been empirically started on clomiphene 25 mg twice a week in 11/2015 and with this his energy level and testosterone level had improved significantly Again feels fairly good with his energy level and no change in libido  Testosterone level is significantly improved compared to the last 2 numbers  Lab Results  Component Value Date   TESTOSTERONE 542.77 08/26/2017   TESTOSTERONE 390.14 04/23/2017   TESTOSTERONE  272.30 (L) 01/22/2017   TESTOSTERONE 643.8 10/19/2016    LABS:  No visits with results within 1 Week(s) from this visit.  Latest known visit with results is:  Lab on 08/26/2017  Component Date Value Ref Range Status  . Testosterone 08/26/2017 542.77  300.00 - 890.00 ng/dL Final  . TSH 08/26/2017 3.78  0.35 - 4.50 uIU/mL Final  . Sodium 08/26/2017 137  135 - 145 mEq/L Final  . Potassium 08/26/2017 4.4  3.5 - 5.1 mEq/L Final  . Chloride 08/26/2017 102  96 - 112 mEq/L Final  . CO2 08/26/2017 28  19 - 32 mEq/L Final  . Glucose, Bld 08/26/2017 134* 70 - 99 mg/dL Final  . BUN 08/26/2017 17  6 - 23 mg/dL Final  . Creatinine, Ser 08/26/2017 0.86   0.40 - 1.50 mg/dL Final  . Total Bilirubin 08/26/2017 0.9  0.2 - 1.2 mg/dL Final  . Alkaline Phosphatase 08/26/2017 41  39 - 117 U/L Final  . AST 08/26/2017 40* 0 - 37 U/L Final  . ALT 08/26/2017 39  0 - 53 U/L Final  . Total Protein 08/26/2017 6.6  6.0 - 8.3 g/dL Final  . Albumin 08/26/2017 4.3  3.5 - 5.2 g/dL Final  . Calcium 08/26/2017 9.1  8.4 - 10.5 mg/dL Final  . GFR 08/26/2017 98.64  >60.00 mL/min Final  . Hgb A1c MFr Bld 08/26/2017 5.8  4.6 - 6.5 % Final   Glycemic Control Guidelines for People with Diabetes:Non Diabetic:  <6%Goal of Therapy: <7%Additional Action Suggested:  >8%     Allergies as of 09/30/2017   No Known Allergies     Medication List        Accurate as of 09/30/17  9:19 PM. Always use your most recent med list.          amLODipine-benazepril 10-40 MG capsule Commonly known as:  LOTREL TAKE 1 CAPSULE BY MOUTH DAILY.   bisoprolol-hydrochlorothiazide 5-6.25 MG tablet Commonly known as:  ZIAC Take 1 tablet by mouth daily.   clomiPHENE 50 MG tablet Commonly known as:  CLOMID 1/2 pill twice weekly   desvenlafaxine 100 MG 24 hr tablet Commonly known as:  PRISTIQ TAKE 1 TABLET (100 MG TOTAL) BY MOUTH DAILY.   KLS ALLERCLEAR 10 MG tablet Generic drug:  loratadine Take 10 mg by mouth daily.   levothyroxine 150 MCG tablet Commonly known as:  SYNTHROID, LEVOTHROID Take 1 tablet (150 mcg total) by mouth daily.   metFORMIN 500 MG 24 hr tablet Commonly known as:  GLUCOPHAGE-XR Take 3 tablets (1,500 mg total) by mouth daily with supper.   methocarbamol 750 MG tablet Commonly known as:  ROBAXIN Take 1 tablet (750 mg total) by mouth every 8 (eight) hours as needed for muscle spasms.   MULTIVITAMIN ADULT PO Take by mouth.   tamsulosin 0.4 MG Caps capsule Commonly known as:  FLOMAX TAKE 1 CAPSULE (0.4 MG TOTAL) BY MOUTH DAILY AFTER SUPPER.   triamcinolone cream 0.1 % Commonly known as:  KENALOG Apply 1 application topically 2 (two) times daily.     vitamin B-12 1000 MCG tablet Commonly known as:  CYANOCOBALAMIN Take 1,000 mcg by mouth daily.       Past Medical History:  Diagnosis Date  . Alopecia totalis   . Hypertension   . Vitiligo     Past Surgical History:  Procedure Laterality Date  . CARPAL TUNNEL RELEASE      Family History  Problem Relation Age of Onset  . Diabetes Father   .  Hypertension Father   . Diabetes Paternal Aunt   . Cancer Maternal Aunt        breast    Social History:  reports that he has never smoked. He has never used smokeless tobacco. His alcohol and drug histories are not on file.   Allergies: No Known Allergies  REVIEW of systems:.   No history of hyperlipidemia.   His total cholesterol is below 100 but also has a low HDL  Lab Results  Component Value Date   CHOL 83 01/22/2017   HDL 39.30 01/22/2017   LDLCALC 20 01/22/2017   TRIG 117.0 01/22/2017   CHOLHDL 2 01/22/2017        Eyes: Has not had eye exam for several years Despite reminders                           Examination:   BP 118/82   Pulse 80   Wt 232 lb (105.2 kg)   SpO2 96%   BMI 33.29 kg/m   Repeat blood pressure  was done with the large cuff  Physical Exam    Assessments/PLAN:   DIABETES: Mild and diagnosed on glucose tolerance test and he is on 1000 mg metformin  His fasting readings now 134 and appears to be increasing He can be better also with diet and exercise regimen Since his A1c is gradually rising also he will increase the dose to 1500 mg now He thinks he can start getting back into the exercise regimen also   HYPOTHYROIDISM treated with 121mcg levothyroxine He is feeling well subjectively TSH normal   Decreased libido and HYPOGONADISM:   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 probably related to metabolic syndrome  With clomiphene 25 mg twice a week his testosterone is consistently back to normal and he subjectively is doing better with his energy and motivation Although  his level is relatively higher over 500 will not to reduce the dose as yet and monitor this on the next visit again  Hypertension: Blood pressure is fairly well controlled He will continue bisoprolol HCT and Lotrel  Colonoscopy: He will need to get a referral from his new PCP  Depression: He will discuss any modification of his treatment with new PCP   There are no Patient Instructions on file for this visit.  Elayne Snare 09/30/2017, 9:19 PM   Note: This office note was prepared with Dragon voice recognition system technology. Any transcriptional errors that result from this process are unintentional.

## 2017-10-07 ENCOUNTER — Ambulatory Visit: Payer: Self-pay | Admitting: Endocrinology

## 2017-10-07 MED FILL — DESVENLAFAXINE SUC ER 100 M: 100 | 30 days supply | Qty: 30 | Fill #10

## 2017-10-27 ENCOUNTER — Other Ambulatory Visit: Payer: Self-pay | Admitting: Endocrinology

## 2017-10-27 MED FILL — LEVOTHYROXINE 150 MCG TAB: 150 | 90 days supply | Qty: 90 | Fill #0

## 2017-11-02 MED FILL — DESVENLAFAXINE SUC ER 100 M: 100 | 30 days supply | Qty: 30 | Fill #11

## 2017-11-02 MED FILL — AMLODIPINE-BENAZ 10-40 MG: 10-40 | 90 days supply | Qty: 90 | Fill #1

## 2017-11-02 MED FILL — TAMSULOSIN HCL 0.4 MG CAP: 0.4 | 30 days supply | Qty: 30 | Fill #3

## 2017-11-29 ENCOUNTER — Other Ambulatory Visit: Payer: Self-pay | Admitting: Endocrinology

## 2017-11-29 MED FILL — TAMSULOSIN HCL 0.4 MG CAP: 0.4 | 30 days supply | Qty: 30 | Fill #0

## 2017-11-29 MED FILL — BISOPROLOL-HCTZ 5-6.25 MG T: 5-6.25 | 90 days supply | Qty: 90 | Fill #0

## 2017-12-08 ENCOUNTER — Other Ambulatory Visit: Payer: Self-pay | Admitting: Endocrinology

## 2017-12-08 MED FILL — DESVENLAFAXINE SUC ER 100 M: 100 | 90 days supply | Qty: 90 | Fill #0

## 2017-12-24 ENCOUNTER — Ambulatory Visit (INDEPENDENT_AMBULATORY_CARE_PROVIDER_SITE_OTHER): Payer: No Typology Code available for payment source | Admitting: Family Medicine

## 2017-12-24 ENCOUNTER — Encounter: Payer: Self-pay | Admitting: Family Medicine

## 2017-12-24 VITALS — BP 102/70 | HR 77 | Temp 98.0°F | Ht 70.0 in | Wt 237.4 lb

## 2017-12-24 DIAGNOSIS — J3089 Other allergic rhinitis: Secondary | ICD-10-CM | POA: Diagnosis not present

## 2017-12-24 DIAGNOSIS — F419 Anxiety disorder, unspecified: Secondary | ICD-10-CM

## 2017-12-24 DIAGNOSIS — I10 Essential (primary) hypertension: Secondary | ICD-10-CM | POA: Diagnosis not present

## 2017-12-24 DIAGNOSIS — J309 Allergic rhinitis, unspecified: Secondary | ICD-10-CM

## 2017-12-24 DIAGNOSIS — Z1211 Encounter for screening for malignant neoplasm of colon: Secondary | ICD-10-CM | POA: Diagnosis not present

## 2017-12-24 DIAGNOSIS — R399 Unspecified symptoms and signs involving the genitourinary system: Secondary | ICD-10-CM

## 2017-12-24 DIAGNOSIS — E039 Hypothyroidism, unspecified: Secondary | ICD-10-CM | POA: Diagnosis not present

## 2017-12-24 HISTORY — DX: Anxiety disorder, unspecified: F41.9

## 2017-12-24 HISTORY — DX: Allergic rhinitis, unspecified: J30.9

## 2017-12-24 HISTORY — DX: Encounter for screening for malignant neoplasm of colon: Z12.11

## 2017-12-24 HISTORY — DX: Unspecified symptoms and signs involving the genitourinary system: R39.9

## 2017-12-24 MED ORDER — FLUTICASONE PROPIONATE 50 MCG/ACT NA SUSP: 2.0000 | Freq: Every day | NASAL | 6 refills | Status: DC

## 2017-12-24 NOTE — Progress Notes (Signed)
Mark Black - 52 y.o. male MRN [id number]  Date of birth: [birth year]  Subjective Chief Complaint  Patient presents with  . Establish Care    est care/had lab with endo doc/allergy?    HPI Mark Black is a 52 y.o. male here today to establish care with new PCP.  He has been seeing Dr. Dwyane Dee with endocrinology for primary care needs but feels that he needs to establish with PCP.  He has a history of hypothyroidism, alopecia, prediabetes, vitiligo and depression/anxiety.  He plans to continue to see Dr. Dwyane Dee for endocrine needs but will have BP meds and Pristiq managed here.    -HTN:  Current treatment with lotrel and ziac, doing well with current medications and denies side effects including chronic cough or symptoms hypotension.  He does have some elevated levels of stress due to his job but has been able to manage this well with pristiq.  He has not had chest pain, shortness of breath, palpitations, headache, or vision changes.   -Depression/Anxiety:  Issues in the past related to stressful work.  Currently treated with pristiq which is working well for him.  Some frustrations while driving otherwise doing well.  He denies SI/HI or plan.      Office Visit from 12/24/2017 in [facility] [place]  PHQ-2 Total Score  0      -LUT's:  Possible history of BPH, some issues with weak urinary stream and nocturia.  He is on tamsulosin which is helpful.  Referred to urology previously but declines and he does not want DRE for prostate check.  He is unsure about having PSA checked.    -Allergies:  Reports chronic nasal congestion for several years.  Had some type of sinus surgery ~20 years ago with ENT but didn't seem to help.  He has used flonase  With improvement previously and would be willing to try again.  He is using and OTC loratadine, unsure if this is helpful  He is interested in colonoscopy for colon cancer screening.    ROS:  ROS completed and negative except as noted  per HPI No Known Allergies  Past Medical History:  Diagnosis Date  . Allergy   . Alopecia totalis   . Chicken pox   . Hypertension   . Vitiligo     Past Surgical History:  Procedure Laterality Date  . CARPAL TUNNEL RELEASE      Social History   Socioeconomic History  . Marital status: Married    Spouse name: Not on file  . Number of children: Not on file  . Years of education: Not on file  . Highest education level: Not on file  Occupational History  . Not on file  Social Needs  . Financial resource strain: Not on file  . Food insecurity:    Worry: Not on file    Inability: Not on file  . Transportation needs:    Medical: Not on file    Non-medical: Not on file  Tobacco Use  . Smoking status: Never Smoker  . Smokeless tobacco: Never Used  Substance and Sexual Activity  . Alcohol use: Not Currently    Frequency: Never  . Drug use: Yes    Types: Marijuana    Comment: once in a while  . Sexual activity: Not on file  Lifestyle  . Physical activity:    Days per week: Not on file    Minutes per session: Not on file  . Stress: Not on file  Relationships  .  Social connections:    Talks on phone: Not on file    Gets together: Not on file    Attends religious service: Not on file    Active member of club or organization: Not on file    Attends meetings of clubs or organizations: Not on file    Relationship status: Not on file  Other Topics Concern  . Not on file  Social History Narrative  . Not on file    Family History  Problem Relation Age of Onset  . Diabetes Father   . Hypertension Father   . Diabetes Paternal Aunt   . Cancer Maternal Aunt        breast    Health Maintenance  Topic Date Due  . Hepatitis C Screening  10/09/1963  . PNEUMOCOCCAL POLYSACCHARIDE VACCINE (1) 01/11/1966  . OPHTHALMOLOGY EXAM  01/11/1974  . HIV Screening  01/12/1979  . COLONOSCOPY  01/11/2014  . FOOT EXAM  08/21/2016  . INFLUENZA VACCINE  02/10/2018  . HEMOGLOBIN  A1C  02/23/2018  . TETANUS/TDAP  01/27/2027    ----------------------------------------------------------------------------------------------------------------------------------------------------------------------------------------------------------------- Physical Exam BP 102/70   Pulse 77   Temp 98 F (36.7 C) (Oral)   Ht 5\' 10"  (1.778 m)   Wt 237 lb 6.4 oz (107.7 kg)   SpO2 94%   BMI 34.06 kg/m   Physical Exam  Constitutional: He is oriented to person, place, and time. He appears well-nourished. No distress.  HENT:  Head: Normocephalic and atraumatic.  Mouth/Throat: Oropharynx is clear and moist.  Eyes: No scleral icterus.  Neck: Neck supple.  Cardiovascular: Normal rate, regular rhythm and normal heart sounds.  Pulmonary/Chest: Effort normal and breath sounds normal.  Abdominal: Soft. Bowel sounds are normal.  Musculoskeletal: He exhibits no tenderness.  Lymphadenopathy:    He has no cervical adenopathy.  Neurological: He is alert and oriented to person, place, and time. Coordination normal.  Skin: Skin is warm. There is erythema (recent sunburn).  Psychiatric: He has a normal mood and affect. His behavior is normal.    ------------------------------------------------------------------------------------------------------------------------------------------------------------------------------------------------------------------- Assessment and Plan  Lower urinary tract symptoms (LUTS) Reports symptoms are stable with flomax Declines DRE Will let me know if he decides to have PSA completed.   Anxiety Stable with pristiq, will plan to continue.  He will let me know if he is needing refills.   Hypothyroidism Followed by endocrinology, reports stable symptoms at this time.   Essential hypertension BP is well controlled at this time He will continue current medications Follow low salt diet He will let me know when he is needing refills. Recent labs reviewed.    Allergic rhinitis Encouraged to try cetirizine instead of loratadine New rx for fluticasone, instructed on proper technique to use.   Screening for colon cancer Orders for colonoscopy placed.

## 2017-12-24 NOTE — Assessment & Plan Note (Signed)
Orders for colonoscopy placed.

## 2017-12-24 NOTE — Assessment & Plan Note (Signed)
Stable with pristiq, will plan to continue.  He will let me know if he is needing refills.

## 2017-12-24 NOTE — Patient Instructions (Signed)
Try zyrtec (cetirizine) daily Let me know if you decide to have PSA (prostate cancer screening test ) completed.   Allergic Rhinitis, Adult Allergic rhinitis is an allergic reaction that affects the mucous membrane inside the nose. It causes sneezing, a runny or stuffy nose, and the feeling of mucus going down the back of the throat (postnasal drip). Allergic rhinitis can be mild to severe. There are two types of allergic rhinitis:  Seasonal. This type is also called hay fever. It happens only during certain seasons.  Perennial. This type can happen at any time of the year.  What are the causes? This condition happens when the body's defense system (immune system) responds to certain harmless substances called allergens as though they were germs.  Seasonal allergic rhinitis is triggered by pollen, which can come from grasses, trees, and weeds. Perennial allergic rhinitis may be caused by:  House dust mites.  Pet dander.  Mold spores.  What are the signs or symptoms? Symptoms of this condition include:  Sneezing.  Runny or stuffy nose (nasal congestion).  Postnasal drip.  Itchy nose.  Tearing of the eyes.  Trouble sleeping.  Daytime sleepiness.  How is this diagnosed? This condition may be diagnosed based on:  Your medical history.  A physical exam.  Tests to check for related conditions, such as: ? Asthma. ? Pink eye. ? Ear infection. ? Upper respiratory infection.  Tests to find out which allergens trigger your symptoms. These may include skin or blood tests.  How is this treated? There is no cure for this condition, but treatment can help control symptoms. Treatment may include:  Taking medicines that block allergy symptoms, such as antihistamines. Medicine may be given as a shot, nasal spray, or pill.  Avoiding the allergen.  Desensitization. This treatment involves getting ongoing shots until your body becomes less sensitive to the allergen. This  treatment may be done if other treatments do not help.  If taking medicine and avoiding the allergen does not work, new, stronger medicines may be prescribed.  Follow these instructions at home:  Find out what you are allergic to. Common allergens include smoke, dust, and pollen.  Avoid the things you are allergic to. These are some things you can do to help avoid allergens: ? Replace carpet with wood, tile, or vinyl flooring. Carpet can trap dander and dust. ? Do not smoke. Do not allow smoking in your home. ? Change your heating and air conditioning filter at least once a month. ? During allergy season:  Keep windows closed as much as possible.  Plan outdoor activities when pollen counts are lowest. This is usually during the evening hours.  When coming indoors, change clothing and shower before sitting on furniture or bedding.  Take over-the-counter and prescription medicines only as told by your health care provider.  Keep all follow-up visits as told by your health care provider. This is important. Contact a health care provider if:  You have a fever.  You develop a persistent cough.  You make whistling sounds when you breathe (you wheeze).  Your symptoms interfere with your normal daily activities. Get help right away if:  You have shortness of breath. Summary  This condition can be managed by taking medicines as directed and avoiding allergens.  Contact your health care provider if you develop a persistent cough or fever.  During allergy season, keep windows closed as much as possible. This information is not intended to replace advice given to you by your health  care provider. Make sure you discuss any questions you have with your health care provider. Document Released: 03/24/2001 Document Revised: 08/06/2016 Document Reviewed: 08/06/2016 Elsevier Interactive Patient Education  [employer].

## 2017-12-24 NOTE — Assessment & Plan Note (Signed)
Followed by endocrinology, reports stable symptoms at this time.

## 2017-12-24 NOTE — Assessment & Plan Note (Signed)
BP is well controlled at this time He will continue current medications Follow low salt diet He will let me know when he is needing refills. Recent labs reviewed.

## 2017-12-24 NOTE — Assessment & Plan Note (Signed)
Reports symptoms are stable with flomax Declines DRE Will let me know if he decides to have PSA completed.

## 2017-12-24 NOTE — Assessment & Plan Note (Signed)
Encouraged to try cetirizine instead of loratadine New rx for fluticasone, instructed on proper technique to use.

## 2018-01-03 ENCOUNTER — Other Ambulatory Visit: Payer: Self-pay | Admitting: Endocrinology

## 2018-01-03 MED FILL — METFORMIN HCL ER 500 MG TAB: 500 | 90 days supply | Qty: 180 | Fill #0

## 2018-01-05 MED FILL — TAMSULOSIN HCL 0.4 MG CAP: 0.4 | 30 days supply | Qty: 30 | Fill #1

## 2018-01-24 ENCOUNTER — Encounter: Payer: Self-pay | Admitting: Family Medicine

## 2018-01-24 MED FILL — LEVOTHYROXINE 150 MCG TAB: 150 | 90 days supply | Qty: 90 | Fill #1

## 2018-01-26 ENCOUNTER — Encounter: Payer: Self-pay | Admitting: Family Medicine

## 2018-01-28 ENCOUNTER — Other Ambulatory Visit: Payer: No Typology Code available for payment source

## 2018-01-31 ENCOUNTER — Other Ambulatory Visit: Payer: Self-pay | Admitting: Endocrinology

## 2018-01-31 MED FILL — AMLODIPINE-BENAZ 10-40 MG: 10-40 | 90 days supply | Qty: 90 | Fill #0

## 2018-02-03 ENCOUNTER — Ambulatory Visit: Payer: No Typology Code available for payment source | Admitting: Endocrinology

## 2018-02-09 MED FILL — TAMSULOSIN HCL 0.4 MG CAP: 0.4 | 30 days supply | Qty: 30 | Fill #2

## 2018-03-07 MED FILL — DESVENLAFAXINE SUC ER 100 M: 100 | 90 days supply | Qty: 90 | Fill #1

## 2018-03-07 MED FILL — BISOPROLOL-HCTZ 5-6.25 MG T: 5-6.25 | 90 days supply | Qty: 90 | Fill #1

## 2018-03-17 ENCOUNTER — Other Ambulatory Visit (INDEPENDENT_AMBULATORY_CARE_PROVIDER_SITE_OTHER): Payer: No Typology Code available for payment source

## 2018-03-17 DIAGNOSIS — E119 Type 2 diabetes mellitus without complications: Secondary | ICD-10-CM | POA: Diagnosis not present

## 2018-03-17 DIAGNOSIS — E063 Autoimmune thyroiditis: Secondary | ICD-10-CM | POA: Diagnosis not present

## 2018-03-17 DIAGNOSIS — E291 Testicular hypofunction: Secondary | ICD-10-CM | POA: Diagnosis not present

## 2018-03-17 LAB — MICROALBUMIN / CREATININE URINE RATIO
CREATININE, U: 199.5 mg/dL
MICROALB/CREAT RATIO: 1.4 mg/g (ref 0.0–30.0)
Microalb, Ur: 2.7 mg/dL — ABNORMAL HIGH (ref 0.0–1.9)

## 2018-03-17 LAB — TESTOSTERONE: TESTOSTERONE: 275.69 ng/dL — AB (ref 300.00–890.00)

## 2018-03-17 LAB — BASIC METABOLIC PANEL
BUN: 12 mg/dL (ref 6–23)
CHLORIDE: 98 meq/L (ref 96–112)
CO2: 24 mEq/L (ref 19–32)
Calcium: 9.3 mg/dL (ref 8.4–10.5)
Creatinine, Ser: 0.77 mg/dL (ref 0.40–1.50)
GFR: 111.82 mL/min (ref >60.00)
Glucose, Bld: 328 mg/dL — ABNORMAL HIGH (ref 70–99)
POTASSIUM: 4.3 meq/L (ref 3.5–5.1)
SODIUM: 132 meq/L — AB (ref 135–145)

## 2018-03-17 LAB — TSH: TSH: 5.17 u[IU]/mL — AB (ref 0.35–4.50)

## 2018-03-17 LAB — HEMOGLOBIN A1C: Hgb A1c MFr Bld: 7.9 % — ABNORMAL HIGH (ref 4.6–6.5)

## 2018-03-17 MED FILL — TAMSULOSIN HCL 0.4 MG CAP: 0.4 | 30 days supply | Qty: 30 | Fill #3

## 2018-03-21 ENCOUNTER — Ambulatory Visit: Payer: No Typology Code available for payment source | Admitting: Endocrinology

## 2018-03-21 ENCOUNTER — Encounter: Payer: Self-pay | Admitting: Endocrinology

## 2018-03-21 VITALS — BP 120/80 | HR 101 | Ht 71.0 in | Wt 227.0 lb

## 2018-03-21 DIAGNOSIS — E119 Type 2 diabetes mellitus without complications: Secondary | ICD-10-CM

## 2018-03-21 DIAGNOSIS — I10 Essential (primary) hypertension: Secondary | ICD-10-CM

## 2018-03-21 DIAGNOSIS — E1165 Type 2 diabetes mellitus with hyperglycemia: Secondary | ICD-10-CM | POA: Diagnosis not present

## 2018-03-21 DIAGNOSIS — E291 Testicular hypofunction: Secondary | ICD-10-CM

## 2018-03-21 DIAGNOSIS — E063 Autoimmune thyroiditis: Secondary | ICD-10-CM

## 2018-03-21 DIAGNOSIS — Z23 Encounter for immunization: Secondary | ICD-10-CM

## 2018-03-21 HISTORY — DX: Type 2 diabetes mellitus without complications: E11.9

## 2018-03-21 MED ORDER — CLOMIPHENE CITRATE 50 MG PO TABS: ORAL_TABLET | ORAL | 1 refills | Status: DC

## 2018-03-21 MED ORDER — METFORMIN HCL ER 500 MG PO TB24: 1500.0000 mg | ORAL_TABLET | Freq: Every day | ORAL | 3 refills | Status: DC

## 2018-03-21 MED ORDER — GLUCOSE BLOOD VI STRP: ORAL_STRIP | 12 refills | Status: DC

## 2018-03-21 MED ORDER — DAPAGLIFLOZIN PROPANEDIOL 5 MG PO TABS: 5.0000 mg | ORAL_TABLET | Freq: Every day | ORAL | 2 refills | Status: DC

## 2018-03-21 MED FILL — CLOMIPHENE CITRATE 50 MG TA: 50 | 84 days supply | Qty: 12 | Fill #0

## 2018-03-21 MED FILL — FARXIGA 5 MG TABLET: 5 | 30 days supply | Qty: 30 | Fill #0

## 2018-03-21 MED FILL — METFORMIN HCL ER 500 MG TAB: 500 | 90 days supply | Qty: 270 | Fill #0

## 2018-03-21 NOTE — Patient Instructions (Addendum)
Take extra pill weekly of thyroid medication.  Next prescription will be for 175 mcg daily  Restart clomiphene half tablet twice a week and continue long-term  Check with the insurance about the [facility] program for diabetes  Check blood sugars on waking up about 2 times a week  Also check blood sugars about 2 hours after a meal and do this after different meals by rotation  Recommended blood sugar levels on waking up is 80-130 and about 2 hours after meal is 130-160 Your blood sugar in the lab was 328 recently  Please bring your blood sugar monitor to each visit, thank you  Try to consistently watch portions of carbohydrates and high-fat meals  With new medication for diabetes increase fluid intake Also will to reduce the dose of BISOPROLOL to half a tablet Try to monitor blood pressure regularly at home also has blood pressure may come down

## 2018-03-21 NOTE — Progress Notes (Signed)
Patient ID: Mark Black, male   DOB: Jun 20, 1966, 52 y.o.   MRN: [id number]   Reason for Appointment:   follow-up of various problems    History of Present Illness:   DIABETES: This has been mild and diagnosed on his glucose tolerance test His fasting glucose has been high since 2011 when it was 109.    GTT in 12/13 showed FASTING glucose of 121, two-hour reading of 245. He was started on metformin ER 1000 mg after the GTT results Currently taking the same dose, has been advised 1500 mg previously but he forgets   He has been advised to be consistent in his efforts to lose weight and to be watching his diet   However his compliance with diet and exercise has been variable  He will periodically go off his diet and not exercise he has been working out of town at times including recently His weight is about 10 pounds lower   Nonfasting GLUCOSE is 328 after breakfast  A1c is usually around the prediabetic range and now much higher at 7.9     Wt Readings from Last 3 Encounters:  03/21/18 227 lb (103 kg)  12/24/17 237 lb 6.4 oz (107.7 kg)  09/30/17 232 lb (105.2 kg)    Lab Results  Component Value Date   HGBA1C 7.9 (H) 03/17/2018   HGBA1C 5.8 08/26/2017   HGBA1C 5.6 04/23/2017   Lab Results  Component Value Date   MICROALBUR 2.7 (H) 03/17/2018   [place] 20 01/22/2017   CREATININE 0.77 03/17/2018    Hypertension:    He has had long-standing hypertension   Does not monitor his blood pressure at home He has been on 5 mg Ziac since 2013 in addition to maximum dose Lotrel   Blood pressure is fairly good today at the end of the exam   BP Readings from Last 3 Encounters:  03/21/18 120/80  12/24/17 102/70  09/30/17 118/82     Hypothyroidism:   The hypothyroidism was first diagnosed In 1996 with weight gain and feeling sluggish   The treatments that the patient has taken include Synthroid brand name and Subsequently levothyroxine Compliance with  the medical regimen has been as prescribed with taking the tablet in the morning before breakfast. He does feel more tired now   TSH is consistently normal but now is 5.2 Currently he is taking 150 g levothyroxine   Lab Results  Component Value Date   TSH 5.17 (H) 03/17/2018   TSH 3.78 08/26/2017   TSH 3.38 04/23/2017   FREET4 1.13 12/11/2013   FREET4 1.27 06/06/2013    DEPRESSION: Has history of long-standing of depression, he has been on 100 mg of Pristiq with good control of his anxiety and depression      HYPOGONADISM:  He has had fatigue and decreased libido for the last few years His evaluation had shown and normal LH and prolactin levels along with significantly decreased free testosterone  He had been empirically started on clomiphene 25 mg twice a week in 11/2015 and with this his energy level and testosterone level had improved significantly He does complain of fatigue now He misunderstood and stopped taking the clomiphene on his last visit  Testosterone level was significantly improved in 2/19 but now it is below 300 again    Lab Results  Component Value Date   TESTOSTERONE 275.69 (L) 03/17/2018   TESTOSTERONE 542.77 08/26/2017   TESTOSTERONE 390.14 04/23/2017   TESTOSTERONE 272.30 (L) 01/22/2017  LABS:  Lab on 03/17/2018  Component Date Value Ref Range Status  . TSH 03/17/2018 5.17* 0.35 - 4.50 uIU/mL Final  . Microalb, Ur 03/17/2018 2.7* 0.0 - 1.9 mg/dL Final  . Creatinine,U 03/17/2018 199.5  mg/dL Final  . Microalb Creat Ratio 03/17/2018 1.4  0.0 - 30.0 mg/g Final  . Testosterone 03/17/2018 275.69* 300.00 - 890.00 ng/dL Final  . Sodium 03/17/2018 132* 135 - 145 mEq/L Final  . Potassium 03/17/2018 4.3  3.5 - 5.1 mEq/L Final  . Chloride 03/17/2018 98  96 - 112 mEq/L Final  . CO2 03/17/2018 24  19 - 32 mEq/L Final  . Glucose, Bld 03/17/2018 328* 70 - 99 mg/dL Final  . BUN 03/17/2018 12  6 - 23 mg/dL Final  . Creatinine, Ser 03/17/2018 0.77  0.40 -  1.50 mg/dL Final  . Calcium 03/17/2018 9.3  8.4 - 10.5 mg/dL Final  . GFR 03/17/2018 111.82  >60.00 mL/min Final  . Hgb A1c MFr Bld 03/17/2018 7.9* 4.6 - 6.5 % Final   Glycemic Control Guidelines for People with Diabetes:Non Diabetic:  <6%Goal of Therapy: <7%Additional Action Suggested:  >8%     Allergies as of 03/21/2018   No Known Allergies     Medication List        Accurate as of 03/21/18  8:39 AM. Always use your most recent med list.          amLODipine-benazepril 10-40 MG capsule Commonly known as:  LOTREL TAKE 1 CAPSULE BY MOUTH DAILY.   bisoprolol-hydrochlorothiazide 5-6.25 MG tablet Commonly known as:  ZIAC TAKE 1 TABLET BY MOUTH DAILY.   desvenlafaxine 100 MG 24 hr tablet Commonly known as:  PRISTIQ TAKE 1 TABLET (100 MG TOTAL) BY MOUTH DAILY.   fluticasone 50 MCG/ACT nasal spray Commonly known as:  FLONASE Place 2 sprays into both nostrils daily.   KLS ALLERCLEAR 10 MG tablet Generic drug:  loratadine Take 10 mg by mouth daily.   levothyroxine 150 MCG tablet Commonly known as:  SYNTHROID, LEVOTHROID TAKE 1 TABLET (150 MCG TOTAL) BY MOUTH DAILY.   metFORMIN 500 MG 24 hr tablet Commonly known as:  GLUCOPHAGE-XR Take 3 tablets (1,500 mg total) by mouth daily with supper.   metFORMIN 500 MG 24 hr tablet Commonly known as:  GLUCOPHAGE-XR TAKE 2 TABLETS (1,000 MG TOTAL) BY MOUTH DAILY WITH SUPPER.   methocarbamol 750 MG tablet Commonly known as:  ROBAXIN Take 1 tablet (750 mg total) by mouth every 8 (eight) hours as needed for muscle spasms.   MULTIVITAMIN ADULT PO Take by mouth.   tamsulosin 0.4 MG Caps capsule Commonly known as:  FLOMAX TAKE 1 CAPSULE (0.4 MG TOTAL) BY MOUTH DAILY AFTER SUPPER.   triamcinolone cream 0.1 % Commonly known as:  KENALOG Apply 1 application topically 2 (two) times daily.   vitamin B-12 1000 MCG tablet Commonly known as:  CYANOCOBALAMIN Take 1,000 mcg by mouth daily.       Past Medical History:  Diagnosis Date    . Allergy   . Alopecia totalis   . Chicken pox   . Hypertension   . Vitiligo     Past Surgical History:  Procedure Laterality Date  . CARPAL TUNNEL RELEASE      Family History  Problem Relation Age of Onset  . Diabetes Father   . Hypertension Father   . Diabetes Paternal Aunt   . Cancer Maternal Aunt        breast    Social History:  reports that he  has never smoked. He has never used smokeless tobacco. He reports that he drank alcohol. He reports that he has current or past drug history. Drug: Marijuana.   Allergies: No Known Allergies  REVIEW of systems:.   No history of hyperlipidemia.   His total cholesterol is below 100 but also has a low HDL  Lab Results  Component Value Date   CHOL 83 01/22/2017   HDL 39.30 01/22/2017   LDLCALC 20 01/22/2017   TRIG 117.0 01/22/2017   CHOLHDL 2 01/22/2017        Eyes: Has not had eye exam for several years Despite reminders                           Examination:   BP 120/80   Pulse (!) 101   Ht 5\' 11"  (1.803 m)   Wt 227 lb (103 kg)   SpO2 97%   BMI 31.66 kg/m      Physical Exam    Assessments/PLAN:   DIABETES: Mild previously  However his blood sugar is much higher now even though he is continuing his metformin and losing weight  Discussed his blood sugar and A1c levels and since A1c is 7.9 he needs additional treatment For simplicity will add SGLT2 drug Discussed action of SGLT 2 drugs on lowering glucose by decreasing kidney absorption of glucose, benefits of weight loss and lower blood pressure, possible side effects including candidiasis and dosage regimen  His insurance prefers [place] 5 mg daily and will start this Also will increase his metformin to 3 tablets daily May consider combining this with metformin on the next visit unless the dose needs to be changed   HYPOTHYROIDISM treated with 176mcg levothyroxine He is feeling somewhat more tired although this may be from his hyperglycemia  TSH  relatively higher now over 5 and will need to increase the dose to 175 mcg, while he is has a supply of 150 mcg he will take an extra pill weekly  Decreased libido and HYPOGONADISM:   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 probably related to metabolic syndrome  With clomiphene 25 mg twice a week his testosterone had previously been quite normal He is complaining of fatigue although this may be nonspecific With his testosterone level going below 300 he does need to get back on the treatment However reminded him to be fasting for his labs on the next visit  Hypertension: Blood pressure is well controlled He will continue bisoprolol HCT and Lotrel but since starting [place] he will need to reduce bisoprolol to half a tablet May consider adjusting Lotrel on the next visit if there is any change in renal function  Lipids: Needs follow-up annually   There are no Patient Instructions on file for this visit.  Elayne Snare 03/21/2018, 8:39 AM   Note: This office note was prepared with Dragon voice recognition system technology. Any transcriptional errors that result from this process are unintentional.

## 2018-03-22 ENCOUNTER — Other Ambulatory Visit: Payer: Self-pay | Admitting: Endocrinology

## 2018-03-22 MED ORDER — FREESTYLE LANCETS MISC: 12 refills | Status: DC

## 2018-03-22 MED ORDER — FREESTYLE LITE DEVI: 3 refills | Status: DC

## 2018-03-22 MED ORDER — GLUCOSE BLOOD VI STRP: ORAL_STRIP | 12 refills | Status: DC

## 2018-03-22 NOTE — Telephone Encounter (Signed)
accu-check not covered by insurance-sent freestyle lite metr, freestyle lancet, and freestyle light test strip sent to [facility].

## 2018-04-06 ENCOUNTER — Encounter: Payer: Self-pay | Admitting: Gastroenterology

## 2018-04-06 MED FILL — FREESTYLE LITE METER: 30 days supply | Qty: 1 | Fill #0

## 2018-04-06 MED FILL — FREESTYLE LITE TEST STRIP: 50 days supply | Qty: 100 | Fill #0

## 2018-04-06 MED FILL — FREESTYLE LANCETS: 50 days supply | Qty: 100 | Fill #0

## 2018-04-12 ENCOUNTER — Encounter: Payer: No Typology Code available for payment source | Attending: Endocrinology | Admitting: Nutrition

## 2018-04-12 DIAGNOSIS — Z713 Dietary counseling and surveillance: Secondary | ICD-10-CM | POA: Diagnosis not present

## 2018-04-12 DIAGNOSIS — E1165 Type 2 diabetes mellitus with hyperglycemia: Secondary | ICD-10-CM | POA: Diagnosis not present

## 2018-04-12 NOTE — Progress Notes (Signed)
Discussed goals for blood sugar readings both before meals and 2 hours after meals.  Discussed ways to decrease his insulin resisitance.  Also discussed the need to balance meals and loose weight. Weight is down 3 pounds.  Patient is please.  Stressed need for exercise and limitng carbs in breakfast meal to no more than 50.  Blood sugar today, 2hr. pcL of fried chicken sandwich and fries was 239.  Discussed fat, and the need to limit them at each meal.  He and his wife reported good understanding of this with no final questions Handouts given on balancing meals, and carbohydrate amounts with 15 gram serving sizes

## 2018-04-18 ENCOUNTER — Other Ambulatory Visit (INDEPENDENT_AMBULATORY_CARE_PROVIDER_SITE_OTHER): Payer: No Typology Code available for payment source

## 2018-04-18 DIAGNOSIS — E1165 Type 2 diabetes mellitus with hyperglycemia: Secondary | ICD-10-CM

## 2018-04-18 DIAGNOSIS — E063 Autoimmune thyroiditis: Secondary | ICD-10-CM

## 2018-04-18 LAB — BASIC METABOLIC PANEL
BUN: 17 mg/dL (ref 6–23)
CALCIUM: 9.4 mg/dL (ref 8.4–10.5)
CHLORIDE: 100 meq/L (ref 96–112)
CO2: 28 mEq/L (ref 19–32)
Creatinine, Ser: 0.77 mg/dL (ref 0.40–1.50)
GFR: 111.79 mL/min (ref >60.00)
Glucose, Bld: 151 mg/dL — ABNORMAL HIGH (ref 70–99)
Potassium: 3.8 mEq/L (ref 3.5–5.1)
Sodium: 135 mEq/L (ref 135–145)

## 2018-04-18 LAB — TSH: TSH: 1.2 u[IU]/mL (ref 0.35–4.50)

## 2018-04-19 LAB — FRUCTOSAMINE: Fructosamine: 328 umol/L — ABNORMAL HIGH (ref 0–285)

## 2018-04-20 ENCOUNTER — Other Ambulatory Visit: Payer: Self-pay | Admitting: Endocrinology

## 2018-04-20 MED FILL — TAMSULOSIN HCL 0.4 MG CAP: 0.4 | 30 days supply | Qty: 30 | Fill #0

## 2018-04-20 MED FILL — LEVOTHYROXINE 150 MCG TAB: 150 | 90 days supply | Qty: 90 | Fill #0

## 2018-04-21 ENCOUNTER — Ambulatory Visit: Payer: No Typology Code available for payment source | Admitting: Endocrinology

## 2018-04-21 ENCOUNTER — Encounter: Payer: Self-pay | Admitting: Endocrinology

## 2018-04-21 VITALS — BP 122/86 | HR 76 | Wt 226.0 lb

## 2018-04-21 DIAGNOSIS — E1165 Type 2 diabetes mellitus with hyperglycemia: Secondary | ICD-10-CM | POA: Diagnosis not present

## 2018-04-21 DIAGNOSIS — E291 Testicular hypofunction: Secondary | ICD-10-CM | POA: Diagnosis not present

## 2018-04-21 NOTE — Progress Notes (Signed)
Patient ID: Mark Black, male   DOB: October 24, 1965, 52 y.o.   MRN: [id number]   Reason for Appointment:   follow-up of various problems    History of Present Illness:   DIABETES: This has been previously mild and diagnosed on his glucose tolerance test His fasting glucose has been high since 2011 when it was 109.    GTT in 12/13 showed FASTING glucose of 121, two-hour reading of 245. He was started on metformin ER 1000 mg after the GTT results Currently taking the same dose, has been advised 1500 mg previously but he forgets  RECENT history:  Current oral hypoglycemic drugs: Metformin ER 1500 mg daily  His A1c is 7.9 in 9/19 Fructosamine is now 328  Current blood sugar patterns, diabetes management and problems identified:   His blood sugars were significantly higher in 9/19 with A1c going up about 2%  Since his fasting glucose was markedly increased at 328 and he had a 10 pound weight loss with his hyperglycemia he was recommended increasing his metformin to 3 tablets and starting [place]  However he did not start [place] as he thought he could do better with diet alone  He has been started on home glucose monitoring  He is trying to cut back on carbohydrates and sometimes eating salads at lunch  Recently checking blood sugars only in the morning before and after breakfast but earlier this month his blood sugars were frequently over 200 in the evenings  He says he is trying to do a little walking the evenings  Weight is about the same    Nonfasting GLUCOSE is 151 after lunch  Home glucose   PRE-MEAL Fasting Lunch Dinner Bedtime Overall  Glucose range:  125-165   107, 248    Mean/median:      184   POST-MEAL PC Breakfast PC Lunch PC Dinner  Glucose range:  155-197  262, 294  146-256  Mean/median:        A1c is usually around the prediabetic range and now much higher at 7.9  Wt Readings from Last 3 Encounters:  04/21/18 226 lb (102.5 kg)    04/12/18 214 lb 6.4 oz (97.3 kg)  03/21/18 227 lb (103 kg)    Lab Results  Component Value Date   HGBA1C 7.9 (H) 03/17/2018   HGBA1C 5.8 08/26/2017   HGBA1C 5.6 04/23/2017   Lab Results  Component Value Date   MICROALBUR 2.7 (H) 03/17/2018   LDLCALC 20 01/22/2017   CREATININE 0.77 04/18/2018   Hypertension:    He has had long-standing hypertension   Does not monitor his blood pressure at home He has been on 5 mg Ziac since 2013 in addition to maximum dose Lotrel   Blood pressure is higher than usual today   BP Readings from Last 3 Encounters:  04/21/18 122/86  03/21/18 120/80  12/24/17 102/70     Hypothyroidism:   The hypothyroidism was first diagnosed In 1996 with weight gain and feeling sluggish   The treatments that the patient has taken include Synthroid brand name and Subsequently levothyroxine Compliance with the medical regimen has been as prescribed with taking the tablet in the morning before breakfast.   TSH previously was consistently normal His dose was increased up to 8 tablets a week of the 150 mcg levothyroxine in 9/19 and TSH is back to normal  Fatigue is less   Lab Results  Component Value Date   TSH 1.20 04/18/2018   TSH 5.17 (H)  03/17/2018   TSH 3.78 08/26/2017   FREET4 1.13 12/11/2013   FREET4 1.27 06/06/2013    DEPRESSION: Has history of long-standing of depression, he has been on 100 mg of Pristiq with good control of his anxiety and depression      HYPOGONADISM:  He has had fatigue and decreased libido for the last few years His evaluation had shown and normal LH and prolactin levels along with significantly decreased free testosterone  He had been empirically started on clomiphene 25 mg twice a week in 11/2015 and with this his energy level and testosterone level had improved significantly He misunderstood and stopped taking the clomiphene prior to his last visit but this has been resumed now on his last visit  Testosterone  level was low last month    Lab Results  Component Value Date   TESTOSTERONE 275.69 (L) 03/17/2018   TESTOSTERONE 542.77 08/26/2017   TESTOSTERONE 390.14 04/23/2017   TESTOSTERONE 272.30 (L) 01/22/2017    LABS:  Lab on 04/18/2018  Component Date Value Ref Range Status  . TSH 04/18/2018 1.20  0.35 - 4.50 uIU/mL Final  . Fructosamine 04/18/2018 328* 0 - 285 umol/L Final   Comment: Published reference interval for apparently healthy subjects between age 28 and 19 is 70 - 285 umol/L and in a poorly controlled diabetic population is 228 - 563 umol/L with a mean of 396 umol/L.   Marland Kitchen Sodium 04/18/2018 135  135 - 145 mEq/L Final  . Potassium 04/18/2018 3.8  3.5 - 5.1 mEq/L Final  . Chloride 04/18/2018 100  96 - 112 mEq/L Final  . CO2 04/18/2018 28  19 - 32 mEq/L Final  . Glucose, Bld 04/18/2018 151* 70 - 99 mg/dL Final  . BUN 04/18/2018 17  6 - 23 mg/dL Final  . Creatinine, Ser 04/18/2018 0.77  0.40 - 1.50 mg/dL Final  . Calcium 04/18/2018 9.4  8.4 - 10.5 mg/dL Final  . GFR 04/18/2018 111.79  >60.00 mL/min Final    Allergies as of 04/21/2018   No Known Allergies     Medication List        Accurate as of 04/21/18  9:09 PM. Always use your most recent med list.          amLODipine-benazepril 10-40 MG capsule Commonly known as:  LOTREL TAKE 1 CAPSULE BY MOUTH DAILY.   bisoprolol-hydrochlorothiazide 5-6.25 MG tablet Commonly known as:  ZIAC TAKE 1 TABLET BY MOUTH DAILY.   clomiPHENE 50 MG tablet Commonly known as:  CLOMID Half tablet twice a week   dapagliflozin propanediol 5 MG Tabs tablet Commonly known as:  FARXIGA Take 5 mg by mouth daily.   desvenlafaxine 100 MG 24 hr tablet Commonly known as:  PRISTIQ TAKE 1 TABLET (100 MG TOTAL) BY MOUTH DAILY.   fluticasone 50 MCG/ACT nasal spray Commonly known as:  FLONASE Place 2 sprays into both nostrils daily.   freestyle lancets Use as instructed   FREESTYLE LITE Devi Use as instructed to check blood sugar  once a day at various times   glucose blood test strip Use as instructed   KLS ALLERCLEAR 10 MG tablet Generic drug:  loratadine Take 10 mg by mouth daily.   levothyroxine 150 MCG tablet Commonly known as:  SYNTHROID, LEVOTHROID TAKE 1 TABLET BY MOUTH DAILY.   metFORMIN 500 MG 24 hr tablet Commonly known as:  GLUCOPHAGE-XR Take 3 tablets (1,500 mg total) by mouth daily with supper.   methocarbamol 750 MG tablet Commonly known as:  ROBAXIN Take  1 tablet (750 mg total) by mouth every 8 (eight) hours as needed for muscle spasms.   MULTIVITAMIN ADULT PO Take by mouth.   tamsulosin 0.4 MG Caps capsule Commonly known as:  FLOMAX TAKE 1 CAPSULE BY MOUTH DAILY AFTER SUPPER.   triamcinolone cream 0.1 % Commonly known as:  KENALOG Apply 1 application topically 2 (two) times daily.   vitamin B-12 1000 MCG tablet Commonly known as:  CYANOCOBALAMIN Take 1,000 mcg by mouth daily.       Past Medical History:  Diagnosis Date  . Allergy   . Alopecia totalis   . Chicken pox   . Hypertension   . Vitiligo     Past Surgical History:  Procedure Laterality Date  . CARPAL TUNNEL RELEASE      Family History  Problem Relation Age of Onset  . Diabetes Father   . Hypertension Father   . Diabetes Paternal Aunt   . Cancer Maternal Aunt        breast    Social History:  reports that he has never smoked. He has never used smokeless tobacco. He reports that he drank alcohol. He reports that he has current or past drug history. Drug: Marijuana.   Allergies: No Known Allergies  REVIEW of systems:.   No history of hyperlipidemia.   His total cholesterol is below 100 but also has a low HDL  Lab Results  Component Value Date   CHOL 83 01/22/2017   HDL 39.30 01/22/2017   LDLCALC 20 01/22/2017   TRIG 117.0 01/22/2017   CHOLHDL 2 01/22/2017        Eyes: Has not had eye exam for several years Despite reminders                           Examination:   BP 122/86 (BP  Location: Left Arm)   Pulse 76   Wt 226 lb (102.5 kg)   SpO2 96%   BMI 31.52 kg/m      Physical Exam    Assessments/PLAN:   DIABETES type II   Recent A1c is 7.9  With metformin alone his blood sugars are improving because of the improved diet but fructosamine of 323 still indicates inadequate control recently Blood sugars have been as high as 294 recently also He and his wife are not understanding the need for medication that would benefit him long-term as well as have beneficial effects with risk reduction of cardiac and renal complications Discussed that [place] will also improve blood pressure control and weight which are issues currently He will check readings at different times by rotation A1c on the next visit  HYPOTHYROIDISM treated with 175mcg levothyroxine, 8 days a week with normal TSH now   Decreased libido and HYPOGONADISM:   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  With resuming clomiphene 25 mg twice a week his testosterone will need to be rechecked on the next visit fasting   Hypertension: Blood pressure is not well controlled This may improve with adding Farxiga Continues to check at home    There are no Patient Instructions on file for this visit.  Elayne Snare 04/21/2018, 9:09 PM   Note: This office note was prepared with Dragon voice recognition system technology. Any transcriptional errors that result from this process are unintentional.

## 2018-04-28 MED FILL — AMLODIPINE-BENAZ 10-40 MG: 10-40 | 90 days supply | Qty: 90 | Fill #1

## 2018-05-13 ENCOUNTER — Ambulatory Visit (AMBULATORY_SURGERY_CENTER): Payer: Self-pay

## 2018-05-13 VITALS — Ht 71.0 in | Wt 226.2 lb

## 2018-05-13 DIAGNOSIS — Z1211 Encounter for screening for malignant neoplasm of colon: Secondary | ICD-10-CM

## 2018-05-13 MED ORDER — PEG 3350-KCL-NA BICARB-NACL 420 G PO SOLR: 4000.0000 mL | Freq: Once | ORAL | 0 refills | Status: AC

## 2018-05-13 NOTE — Progress Notes (Signed)
Per pt, no allergies to soy or egg products.Pt not taking any weight loss meds or using  O2 at home.  Pt will watch colon video online.

## 2018-05-16 MED FILL — PEG-3350 SOLUTION: 420 | 1 days supply | Qty: 4000 | Fill #0

## 2018-05-17 ENCOUNTER — Encounter: Payer: Self-pay | Admitting: Gastroenterology

## 2018-05-19 MED FILL — TAMSULOSIN HCL 0.4 MG CAP: 0.4 | 30 days supply | Qty: 30 | Fill #1

## 2018-05-19 MED FILL — FARXIGA 5 MG TABLET: 5 | 30 days supply | Qty: 30 | Fill #1

## 2018-05-27 ENCOUNTER — Encounter: Payer: Self-pay | Admitting: Gastroenterology

## 2018-05-27 ENCOUNTER — Ambulatory Visit (AMBULATORY_SURGERY_CENTER): Payer: No Typology Code available for payment source | Admitting: Gastroenterology

## 2018-05-27 VITALS — BP 120/69 | HR 78 | Temp 99.3°F | Resp 14 | Ht 71.0 in | Wt 226.0 lb

## 2018-05-27 DIAGNOSIS — K635 Polyp of colon: Secondary | ICD-10-CM

## 2018-05-27 DIAGNOSIS — D120 Benign neoplasm of cecum: Secondary | ICD-10-CM

## 2018-05-27 DIAGNOSIS — Z1211 Encounter for screening for malignant neoplasm of colon: Secondary | ICD-10-CM | POA: Diagnosis not present

## 2018-05-27 MED ORDER — SODIUM CHLORIDE 0.9 % IV SOLN: 500.0000 mL | Freq: Once | INTRAVENOUS | Status: DC

## 2018-05-27 NOTE — Progress Notes (Signed)
Called to room to assist during endoscopic procedure.  Patient ID and intended procedure confirmed with present staff. Received instructions for my participation in the procedure from the performing physician.  

## 2018-05-27 NOTE — Patient Instructions (Signed)
YOU HAD AN ENDOSCOPIC PROCEDURE TODAY AT THE [facility]:   Refer to the procedure report that was given to you for any specific questions about what was found during the examination.  If the procedure report does not answer your questions, please call your gastroenterologist to clarify.  If you requested that your care partner not be given the details of your procedure findings, then the procedure report has been included in a sealed envelope for you to review at your convenience later.  YOU SHOULD EXPECT: Some feelings of bloating in the abdomen. Passage of more gas than usual.  Walking can help get rid of the air that was put into your GI tract during the procedure and reduce the bloating. If you had a lower endoscopy (such as a colonoscopy or flexible sigmoidoscopy) you may notice spotting of blood in your stool or on the toilet paper. If you underwent a bowel prep for your procedure, you may not have a normal bowel movement for a few days.  Please Note:  You might notice some irritation and congestion in your nose or some drainage.  This is from the oxygen used during your procedure.  There is no need for concern and it should clear up in a day or so.  SYMPTOMS TO REPORT IMMEDIATELY:   Following lower endoscopy (colonoscopy or flexible sigmoidoscopy):  Excessive amounts of blood in the stool  Significant tenderness or worsening of abdominal pains  Swelling of the abdomen that is new, acute  Fever of 100F or higher  For urgent or emergent issues, a gastroenterologist can be reached at any hour by calling [phone].   DIET:  We do recommend a small meal at first, but then you may proceed to your regular diet.  Drink plenty of fluids but you should avoid alcoholic beverages for 24 hours.  ACTIVITY:  You should plan to take it easy for the rest of today and you should NOT DRIVE or use heavy machinery until tomorrow (because of the sedation medicines used during the test).     FOLLOW UP: Our staff will call the number listed on your records the next business day following your procedure to check on you and address any questions or concerns that you may have regarding the information given to you following your procedure. If we do not reach you, we will leave a message.  However, if you are feeling well and you are not experiencing any problems, there is no need to return our call.  We will assume that you have returned to your regular daily activities without incident.  If any biopsies were taken you will be contacted by phone or by letter within the next 1-3 weeks.  Please call us at [phone] if you have not heard about the biopsies in 3 weeks.    SIGNATURES/CONFIDENTIALITY: You and/or your care partner have signed paperwork which will be entered into your electronic medical record.  These signatures attest to the fact that that the information above on your After Visit Summary has been reviewed and is understood.  Full responsibility of the confidentiality of this discharge information lies with you and/or your care-partner. 

## 2018-05-27 NOTE — Progress Notes (Signed)
PT taken to PACU. Monitors in place. VSS. Report given to RN. 

## 2018-05-27 NOTE — Op Note (Signed)
[name] Patient Name: Mark Black [facility] Procedure Date: 05/27/2018 9:15 AM MRN: [id number] Endoscopist: Remo Lipps P. Havery Moros , MD Age: 52 Referring MD:  Date of Birth: 24-Dec-1965 Gender: Male Account #: [id number] Procedure:                Colonoscopy Indications:              Screening for colorectal malignant neoplasm Medicines:                Monitored Anesthesia Care Procedure:                Pre-Anesthesia Assessment:                           - Prior to the procedure, a History and Physical                            was performed, and patient medications and                            allergies were reviewed. The patient's tolerance of                            previous anesthesia was also reviewed. The risks                            and benefits of the procedure and the sedation                            options and risks were discussed with the patient.                            All questions were answered, and informed consent                            was obtained. Prior Anticoagulants: The patient has                            taken no previous anticoagulant or antiplatelet                            agents. ASA Grade Assessment: II - A patient with                            mild systemic disease. After reviewing the risks                            and benefits, the patient was deemed in                            satisfactory condition to undergo the procedure.                           After obtaining informed consent, the colonoscope  was passed under direct vision. Throughout the                            procedure, the patient's blood pressure, pulse, and                            oxygen saturations were monitored continuously. The                            Colonoscope was introduced through the anus and                            advanced to the the cecum, identified by                            appendiceal  orifice and ileocecal valve. The                            colonoscopy was performed without difficulty. The                            patient tolerated the procedure well. The quality                            of the bowel preparation was adequate. The                            ileocecal valve, appendiceal orifice, and rectum                            were photographed. Scope In: 9:17:01 AM Scope Out: 9:32:44 AM Scope Withdrawal Time: 0 hours 14 minutes 9 seconds  Total Procedure Duration: 0 hours 15 minutes 43 seconds  Findings:                 The perianal and digital rectal examinations were                            normal.                           A 3 mm polyp was found in the cecum. The polyp was                            flat. The polyp was removed with a cold snare.                            Resection and retrieval were complete.                           The exam was otherwise without abnormality on                            direct and retroflexion views. Complications:  No immediate complications. Estimated blood loss:                            Minimal. Estimated Blood Loss:     Estimated blood loss was minimal. Impression:               - One 3 mm polyp in the cecum, removed with a cold                            snare. Resected and retrieved.                           - The examination was otherwise normal on direct                            and retroflexion views. Recommendation:           - Patient has a contact number available for                            emergencies. The signs and symptoms of potential                            delayed complications were discussed with the                            patient. Return to normal activities tomorrow.                            Written discharge instructions were provided to the                            patient.                           - Resume previous diet.                           - Continue  present medications.                           - Await pathology results. Remo Lipps P. Arika Mainer, MD 05/27/2018 9:34:52 AM This report has been signed electronically.

## 2018-05-30 ENCOUNTER — Telehealth: Payer: Self-pay

## 2018-05-30 NOTE — Telephone Encounter (Signed)
  Follow up Call-  Call back number 05/27/2018  Post procedure Call Back phone  # [phone]  Permission to leave phone message Yes  Some recent data might be hidden     Patient questions:  Do you have a fever, pain , or abdominal swelling? No. Pain Score  0 *  Have you tolerated food without any problems? Yes.    Have you been able to return to your normal activities? Yes.    Do you have any questions about your discharge instructions: Diet   No. Medications  No. Follow up visit  No.  Do you have questions or concerns about your Care? No.  Actions: * If pain score is 4 or above: No action needed, pain <4.

## 2018-06-02 MED FILL — DESVENLAFAXINE SUC ER 100 M: 100 | 90 days supply | Qty: 90 | Fill #2

## 2018-06-17 ENCOUNTER — Other Ambulatory Visit: Payer: Self-pay | Admitting: Endocrinology

## 2018-06-17 MED FILL — BISOPROLOL-HCTZ 5-6.25 MG T: 5-6.25 | 90 days supply | Qty: 90 | Fill #0

## 2018-06-17 MED FILL — TAMSULOSIN HCL 0.4 MG CAP: 0.4 | 30 days supply | Qty: 30 | Fill #2

## 2018-06-17 MED FILL — FARXIGA 5 MG TABLET: 5 | 30 days supply | Qty: 30 | Fill #2

## 2018-06-24 ENCOUNTER — Other Ambulatory Visit (INDEPENDENT_AMBULATORY_CARE_PROVIDER_SITE_OTHER): Payer: No Typology Code available for payment source

## 2018-06-24 DIAGNOSIS — E291 Testicular hypofunction: Secondary | ICD-10-CM

## 2018-06-24 DIAGNOSIS — E1165 Type 2 diabetes mellitus with hyperglycemia: Secondary | ICD-10-CM

## 2018-06-24 LAB — BASIC METABOLIC PANEL
BUN: 14 mg/dL (ref 6–23)
CHLORIDE: 104 meq/L (ref 96–112)
CO2: 26 mEq/L (ref 19–32)
Calcium: 9 mg/dL (ref 8.4–10.5)
Creatinine, Ser: 0.76 mg/dL (ref 0.40–1.50)
GFR: 113.41 mL/min (ref >60.00)
Glucose, Bld: 95 mg/dL (ref 70–99)
Potassium: 4.1 mEq/L (ref 3.5–5.1)
Sodium: 138 mEq/L (ref 135–145)

## 2018-06-24 LAB — LUTEINIZING HORMONE: LH: 6.02 m[IU]/mL (ref 1.50–9.30)

## 2018-06-24 LAB — HEMOGLOBIN A1C: Hgb A1c MFr Bld: 5.5 % (ref 4.6–6.5)

## 2018-06-24 LAB — TESTOSTERONE: Testosterone: 371.45 ng/dL (ref 300.00–890.00)

## 2018-06-29 ENCOUNTER — Ambulatory Visit: Payer: No Typology Code available for payment source | Admitting: Endocrinology

## 2018-06-29 ENCOUNTER — Encounter: Payer: Self-pay | Admitting: Endocrinology

## 2018-06-29 VITALS — BP 140/78 | HR 78 | Ht 71.0 in | Wt 223.4 lb

## 2018-06-29 DIAGNOSIS — E063 Autoimmune thyroiditis: Secondary | ICD-10-CM | POA: Diagnosis not present

## 2018-06-29 DIAGNOSIS — E291 Testicular hypofunction: Secondary | ICD-10-CM

## 2018-06-29 DIAGNOSIS — E119 Type 2 diabetes mellitus without complications: Secondary | ICD-10-CM

## 2018-06-29 NOTE — Progress Notes (Signed)
Patient ID: Mark Black, male   DOB: Dec 12, 1965, 52 y.o.   MRN: [id number]   Reason for Appointment:   follow-up of various problems    History of Present Illness:   DIABETES: This has been previously mild and diagnosed on his glucose tolerance test His fasting glucose has been high since 2011 when it was 109.    GTT in 12/13 showed FASTING glucose of 121, two-hour reading of 245. He was started on metformin ER 1000 mg after the GTT results Currently taking the same dose, has been advised 1500 mg previously but he forgets  RECENT history:  Current oral hypoglycemic drugs: Metformin ER 1500 mg daily, Farxiga 5 mg daily  His A1c is down to 5.5, previously was 7.9 in 9/19   Current blood sugar patterns, diabetes management and problems identified:   His blood sugars were averaging about 184 at home on his last visit in 10/19  He agreed to start Farxiga in addition to his metformin  With this his blood sugars have improved very significantly  However he has been irregular with checking his blood sugars and has readings mostly for the last 3 to 4 days  Usually not checking readings much after dinner or in the evenings and mostly in the morning as outlined below  He says he has been too busy to do any exercise  His weight is down 3 pounds although he thinks he had lost more previously  No side effects like increased urination, lightheadedness or yeast infections with [place] glucose readings from download  Fasting 115-140, midmorning 126-144 and afternoon/evening 99/74 with only 2 readings AVERAGE 119  PREVIOUS reading review:  PRE-MEAL Fasting Lunch Dinner Bedtime Overall  Glucose range:  125-165   107, 248    Mean/median:      184   POST-MEAL PC Breakfast PC Lunch PC Dinner  Glucose range:  155-197  262, 294  146-256  Mean/median:        Wt Readings from Last 3 Encounters:  06/29/18 223 lb 6.4 oz (101.3 kg)  05/27/18 226 lb (102.5 kg)    05/13/18 226 lb 3.2 oz (102.6 kg)    Lab Results  Component Value Date   HGBA1C 5.5 06/24/2018   HGBA1C 7.9 (H) 03/17/2018   HGBA1C 5.8 08/26/2017   Lab Results  Component Value Date   MICROALBUR 2.7 (H) 03/17/2018   LDLCALC 20 01/22/2017   CREATININE 0.76 06/24/2018   Hypertension:    He has had long-standing hypertension  He has been on 5 mg Ziac since 2013 in addition to maximum dose Lotrel   Blood pressure is improving especially diastolic since adding Farxiga   BP Readings from Last 3 Encounters:  06/29/18 140/78  05/27/18 120/69  04/21/18 122/86     Hypothyroidism:   The hypothyroidism was first diagnosed In 1996 with weight gain and feeling sluggish   The treatments that the patient has taken include Synthroid brand name and Subsequently levothyroxine Compliance with the medical regimen has been  good with taking the tablet in the morning before breakfast.   TSH previously was consistently normal His dose was increased up to 8 tablets a week of the 150 mcg levothyroxine in 9/19 and TSH is back to normal No recent fatigue   Lab Results  Component Value Date   TSH 1.20 04/18/2018   TSH 5.17 (H) 03/17/2018   TSH 3.78 08/26/2017   FREET4 1.13 12/11/2013   FREET4 1.27 06/06/2013  DEPRESSION: Has history of long-standing of depression, he has been on 100 mg of Pristiq with good control of his anxiety and depression      HYPOGONADISM:  He has had fatigue and decreased libido for the last few years His evaluation had shown and normal LH and prolactin levels along with significantly decreased free testosterone  He had been empirically started on clomiphene 25 mg twice a week in 11/2015 and with this his energy level and testosterone level had improved significantly  Testosterone level was low when he had stopped his medications on his own in September but now he is back on taking this twice a week Has no complaints about fatigue or libido currently and  testosterone level is back to normal    Lab Results  Component Value Date   TESTOSTERONE 371.45 06/24/2018   TESTOSTERONE 275.69 (L) 03/17/2018   TESTOSTERONE 542.77 08/26/2017   TESTOSTERONE 390.14 04/23/2017    LABS:  Lab on 06/24/2018  Component Date Value Ref Range Status  . Levy 06/24/2018 6.02  1.50 - 9.30 mIU/mL Final   Comment: Male Reference Range:20-70 yrs     1.5-9.3 mIU/mL>70 yrs       3.1-35.6 mIU/mLFemale Reference Range:Follicular Phase     6.2-83.1 mIU/mLMidcycle             8.7-76.3 mIU/mLLuteal Phase         0.5-16.9 mIU/mL  Post Menopausal      15.9-54.0  mIU/mLPregnant             <1.5 mIU/mLContraceptives       0.7-5.6 mIU/mL   . Testosterone 06/24/2018 371.45  300.00 - 890.00 ng/dL Final  . Sodium 06/24/2018 138  135 - 145 mEq/L Final  . Potassium 06/24/2018 4.1  3.5 - 5.1 mEq/L Final  . Chloride 06/24/2018 104  96 - 112 mEq/L Final  . CO2 06/24/2018 26  19 - 32 mEq/L Final  . Glucose, Bld 06/24/2018 95  70 - 99 mg/dL Final  . BUN 06/24/2018 14  6 - 23 mg/dL Final  . Creatinine, Ser 06/24/2018 0.76  0.40 - 1.50 mg/dL Final  . Calcium 06/24/2018 9.0  8.4 - 10.5 mg/dL Final  . GFR 06/24/2018 113.41  >60.00 mL/min Final  . Hgb A1c MFr Bld 06/24/2018 5.5  4.6 - 6.5 % Final   Glycemic Control Guidelines for People with Diabetes:Non Diabetic:  <6%Goal of Therapy: <7%Additional Action Suggested:  >8%     Allergies as of 06/29/2018   No Known Allergies     Medication List       Accurate as of June 29, 2018  4:09 PM. Always use your most recent med list.        amLODipine-benazepril 10-40 MG capsule Commonly known as:  LOTREL TAKE 1 CAPSULE BY MOUTH DAILY.   bisoprolol-hydrochlorothiazide 5-6.25 MG tablet Commonly known as:  ZIAC TAKE 1 TABLET BY MOUTH DAILY.   clomiPHENE 50 MG tablet Commonly known as:  CLOMID Take 50 mg by mouth daily. Take 1 tablet by mouth twice weekly.   desvenlafaxine 100 MG 24 hr tablet Commonly known as:  PRISTIQ TAKE  1 TABLET (100 MG TOTAL) BY MOUTH DAILY.   FARXIGA 5 MG Tabs tablet Generic drug:  dapagliflozin propanediol Take 5 mg by mouth daily. Take 1 tablet by mouth daily.   fluticasone 50 MCG/ACT nasal spray Commonly known as:  FLONASE Place 2 sprays into both nostrils daily.   freestyle lancets Use as instructed   FREESTYLE LITE Devi Use  as instructed to check blood sugar once a day at various times   glucose blood test strip Use as instructed   KLS ALLERCLEAR 10 MG tablet Generic drug:  loratadine Take 10 mg by mouth daily.   levothyroxine 150 MCG tablet Commonly known as:  SYNTHROID, LEVOTHROID TAKE 1 TABLET BY MOUTH DAILY.   metFORMIN 500 MG 24 hr tablet Commonly known as:  GLUCOPHAGE-XR Take 3 tablets (1,500 mg total) by mouth daily with supper.   methocarbamol 750 MG tablet Commonly known as:  ROBAXIN Take 1 tablet (750 mg total) by mouth every 8 (eight) hours as needed for muscle spasms.   MULTIVITAMIN ADULT PO Take by mouth.   tamsulosin 0.4 MG Caps capsule Commonly known as:  FLOMAX TAKE 1 CAPSULE BY MOUTH DAILY AFTER SUPPER.   triamcinolone cream 0.1 % Commonly known as:  KENALOG Apply 1 application topically 2 (two) times daily.   vitamin B-12 1000 MCG tablet Commonly known as:  CYANOCOBALAMIN Take 1,000 mcg by mouth daily.       Past Medical History:  Diagnosis Date  . Allergy   . Alopecia totalis   . Asthma    as a child  . Chicken pox   . Diabetes mellitus without complication ([place])   . Hypertension   . Thyroid disease   . Vitiligo     Past Surgical History:  Procedure Laterality Date  . CARPAL TUNNEL RELEASE     Bil  . NASAL SINUS SURGERY  1994   cut and open air passages/nostrils    Family History  Problem Relation Age of Onset  . Diabetes Father   . Hypertension Father   . Diabetes Paternal Aunt   . Cancer Maternal Aunt        breast  . Diabetes Sister   . Hypertension Sister   . Diabetes Brother   . Colon cancer Neg Hx     . Colon polyps Neg Hx   . Esophageal cancer Neg Hx   . Stomach cancer Neg Hx   . Rectal cancer Neg Hx     Social History:  reports that he has never smoked. He has never used smokeless tobacco. He reports previous alcohol use. He reports previous drug use.   Allergies: No Known Allergies  REVIEW of systems:.   No history of hyperlipidemia.   His total cholesterol is below 100 but also has a low HDL  Lab Results  Component Value Date   CHOL 83 01/22/2017   HDL 39.30 01/22/2017   LDLCALC 20 01/22/2017   TRIG 117.0 01/22/2017   CHOLHDL 2 01/22/2017                            Physical Exam  BP 140/78 (BP Location: Left Arm, Patient Position: Sitting, Cuff Size: Normal)   Pulse 78   Ht 5\' 11"  (1.803 m)   Wt 223 lb 6.4 oz (101.3 kg)   SpO2 98%   BMI 31.16 kg/m      Assessments/PLAN:   DIABETES type II with recent BMI 31  His A1c is now excellent at 5.5  With metformin and Farxiga his blood sugars have improved significantly He is also trying to improve his diet Exercise has been inconsistent Blood sugars may be relatively higher fasting and he has not checked regularly He will continue the same regimen, to check more readings after meals and to also start regular walking for exercise  HYPOTHYROIDISM treated with 151mcg  levothyroxine, 8 days a week To have labs checked again on the next visit   Decreased libido and HYPOGONADISM:   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  With resuming clomiphene 25 mg twice a week his testosterone is back to normal   Hypertension: Blood pressure is better controlled now No change in renal function with adding Farxiga   There are no Patient Instructions on file for this visit.  Elayne Snare 06/29/2018, 4:09 PM   Note: This office note was prepared with Dragon voice recognition system technology. Any transcriptional errors that result from this process are unintentional.

## 2018-06-29 NOTE — Patient Instructions (Addendum)
Check blood sugars on waking up days a week  Also check blood sugars about 2 hours after meals and do this after different meals by rotation  Recommended blood sugar levels on waking up are 90-130 and about 2 hours after meal is 130-160  Please bring your blood sugar monitor to each visit, thank you  More exercise

## 2018-07-01 ENCOUNTER — Ambulatory Visit (INDEPENDENT_AMBULATORY_CARE_PROVIDER_SITE_OTHER): Payer: No Typology Code available for payment source | Admitting: Family Medicine

## 2018-07-01 ENCOUNTER — Encounter: Payer: Self-pay | Admitting: Family Medicine

## 2018-07-01 VITALS — BP 118/78 | HR 77 | Temp 97.9°F | Ht 71.0 in | Wt 221.0 lb

## 2018-07-01 DIAGNOSIS — E119 Type 2 diabetes mellitus without complications: Secondary | ICD-10-CM | POA: Diagnosis not present

## 2018-07-01 DIAGNOSIS — Z23 Encounter for immunization: Secondary | ICD-10-CM | POA: Diagnosis not present

## 2018-07-01 DIAGNOSIS — F419 Anxiety disorder, unspecified: Secondary | ICD-10-CM | POA: Diagnosis not present

## 2018-07-01 DIAGNOSIS — I10 Essential (primary) hypertension: Secondary | ICD-10-CM | POA: Diagnosis not present

## 2018-07-01 DIAGNOSIS — E039 Hypothyroidism, unspecified: Secondary | ICD-10-CM

## 2018-07-01 NOTE — Progress Notes (Signed)
Cloyde Reams Gabor - 52 y.o. male MRN [id number]  Date of birth: [birth year]  Subjective Chief Complaint  Patient presents with  . Follow-up    Doing well, denies changes in his health    HPI Forrest Delane Wessinger is a 52 y.o. male here today for 6 month f/u for HTN and anxiety.  He reports he is doing well.  He continues to follow with endocrinology for mgmt of T2DM, hypothyroidism and hypogonadism.  His recent a1c was 5.5% and and his TSH was normal.  He denies episodes of hypoglycemia.    In regards to blood pressure he continues on Lotrel and ziac.  He is doing well with this and denies side effects including symptoms of hypotension.  He has not had chest pain, shortness of breath, palpitations, headache or vision changes.    He continues to do well on pristiq as well.  He denies any worsening anxiety or depressive symptoms.    ROS:  A comprehensive ROS was completed and negative except as noted per HPI  No Known Allergies  Past Medical History:  Diagnosis Date  . Allergy   . Alopecia totalis   . Asthma    as a child  . Chicken pox   . Diabetes mellitus without complication ([place])   . Hypertension   . Thyroid disease   . Vitiligo     Past Surgical History:  Procedure Laterality Date  . CARPAL TUNNEL RELEASE     Bil  . NASAL SINUS SURGERY  1994   cut and open air passages/nostrils    Social History   Socioeconomic History  . Marital status: Married    Spouse name: Not on file  . Number of children: Not on file  . Years of education: Not on file  . Highest education level: Not on file  Occupational History  . Not on file  Social Needs  . Financial resource strain: Not on file  . Food insecurity:    Worry: Not on file    Inability: Not on file  . Transportation needs:    Medical: Not on file    Non-medical: Not on file  Tobacco Use  . Smoking status: Never Smoker  . Smokeless tobacco: Never Used  Substance and Sexual Activity  . Alcohol use: Not Currently      Frequency: Never  . Drug use: Not Currently  . Sexual activity: Not on file  Lifestyle  . Physical activity:    Days per week: Not on file    Minutes per session: Not on file  . Stress: Not on file  Relationships  . Social connections:    Talks on phone: Not on file    Gets together: Not on file    Attends religious service: Not on file    Active member of club or organization: Not on file    Attends meetings of clubs or organizations: Not on file    Relationship status: Not on file  Other Topics Concern  . Not on file  Social History Narrative  . Not on file    Family History  Problem Relation Age of Onset  . Diabetes Father   . Hypertension Father   . Diabetes Paternal Aunt   . Cancer Maternal Aunt        breast  . Diabetes Sister   . Hypertension Sister   . Diabetes Brother   . Colon cancer Neg Hx   . Colon polyps Neg Hx   . Esophageal cancer Neg  Hx   . Stomach cancer Neg Hx   . Rectal cancer Neg Hx     Health Maintenance  Topic Date Due  . Hepatitis C Screening  August 23, 1965  . PNEUMOCOCCAL POLYSACCHARIDE VACCINE AGE 78-64 HIGH RISK  01/11/1966  . OPHTHALMOLOGY EXAM  01/11/1974  . HIV Screening  01/12/1979  . FOOT EXAM  08/21/2016  . HEMOGLOBIN A1C  12/24/2018  . TETANUS/TDAP  01/27/2027  . COLONOSCOPY  05/27/2028  . INFLUENZA VACCINE  Completed    ----------------------------------------------------------------------------------------------------------------------------------------------------------------------------------------------------------------- Physical Exam BP 118/78   Pulse 77   Temp 97.9 F (36.6 C) (Oral)   Ht 5\' 11"  (1.803 m)   Wt 221 lb (100.2 kg)   SpO2 97%   BMI 30.82 kg/m   Physical Exam Constitutional:      Appearance: Normal appearance.  HENT:     Head: Normocephalic and atraumatic.     Mouth/Throat:     Mouth: Mucous membranes are dry.  Eyes:     General: No scleral icterus. Neck:     Musculoskeletal: Normal range of  motion and neck supple.  Cardiovascular:     Rate and Rhythm: Normal rate and regular rhythm.  Pulmonary:     Effort: Pulmonary effort is normal.     Breath sounds: Normal breath sounds.  Skin:    General: Skin is warm and dry.  Neurological:     General: No focal deficit present.     Mental Status: He is alert.  Psychiatric:        Mood and Affect: Mood normal.        Behavior: Behavior normal.     ------------------------------------------------------------------------------------------------------------------------------------------------------------------------------------------------------------------- Assessment and Plan  T2DM (type 2 diabetes mellitus) ([place]) -Well controlled with current medications.  -He will continue to follow with endocrinology for mgmt of medications.  -Pneumovax given today.   Essential hypertension -BP remains very well controlled -Continue current medications -Follow low salt diet.   Hypothyroidism -Stable, followed by endocrinology.   Anxiety -Stable, doing well with pristiq, will continue

## 2018-07-01 NOTE — Assessment & Plan Note (Signed)
-  Stable, followed by endocrinology.

## 2018-07-01 NOTE — Assessment & Plan Note (Signed)
-  Well controlled with current medications.  -He will continue to follow with endocrinology for mgmt of medications.  -Pneumovax given today.

## 2018-07-01 NOTE — Patient Instructions (Signed)
-  Great to see you! -Everything looks great today! -Since everything is well controlled and you are seeing Dr. Dwyane Dee let's plan for follow up in 1 year or sooner if needed.

## 2018-07-01 NOTE — Assessment & Plan Note (Signed)
-  BP remains very well controlled -Continue current medications -Follow low salt diet.

## 2018-07-01 NOTE — Assessment & Plan Note (Signed)
-  Stable, doing well with pristiq, will continue

## 2018-07-08 MED FILL — CLOMIPHENE CITRATE 50 MG TA: 50 | 84 days supply | Qty: 12 | Fill #1

## 2018-07-11 MED FILL — LEVOTHYROXINE 150 MCG TAB: 150 | 90 days supply | Qty: 90 | Fill #1

## 2018-07-11 MED FILL — metFORMIN HCL ER 500 MG TB2: 500 | 90 days supply | Qty: 270 | Fill #1

## 2018-07-22 ENCOUNTER — Other Ambulatory Visit: Payer: Self-pay | Admitting: Endocrinology

## 2018-07-22 MED FILL — FARXIGA 5 MG TABLET: 5 | 30 days supply | Qty: 30 | Fill #0

## 2018-07-22 MED FILL — TAMSULOSIN HCL 0.4 MG CAP: 0.4 | 30 days supply | Qty: 30 | Fill #3

## 2018-08-15 ENCOUNTER — Other Ambulatory Visit: Payer: Self-pay | Admitting: Endocrinology

## 2018-08-16 MED FILL — AMLODIPINE-BENAZ 10-40 MG: 10-40 | 90 days supply | Qty: 90 | Fill #0 | Status: TO

## 2018-08-16 MED FILL — AMLODIPINE-BENAZEPRIL 10-20: 10-20 | 90 days supply | Qty: 90 | Fill #0

## 2018-08-18 ENCOUNTER — Other Ambulatory Visit: Payer: Self-pay | Admitting: Endocrinology

## 2018-08-18 MED FILL — TAMSULOSIN HCL 0.4 MG CAP: 0.4 | 30 days supply | Qty: 30 | Fill #0

## 2018-08-18 MED FILL — FARXIGA 5 MG TABLET: 5 | 30 days supply | Qty: 30 | Fill #1

## 2018-09-06 ENCOUNTER — Encounter: Payer: Self-pay | Admitting: Physician Assistant

## 2018-09-06 ENCOUNTER — Ambulatory Visit (INDEPENDENT_AMBULATORY_CARE_PROVIDER_SITE_OTHER): Payer: Self-pay | Admitting: Physician Assistant

## 2018-09-06 VITALS — BP 110/90 | HR 95 | Temp 97.6°F | Resp 14 | Wt 224.8 lb

## 2018-09-06 DIAGNOSIS — J0110 Acute frontal sinusitis, unspecified: Secondary | ICD-10-CM

## 2018-09-06 DIAGNOSIS — R112 Nausea with vomiting, unspecified: Secondary | ICD-10-CM

## 2018-09-06 LAB — POCT INFLUENZA A/B
Influenza A, POC: NEGATIVE
Influenza B, POC: NEGATIVE

## 2018-09-06 MED ORDER — ONDANSETRON 8 MG PO TBDP: 8.0000 mg | ORAL_TABLET | Freq: Three times a day (TID) | ORAL | 0 refills | Status: DC | PRN

## 2018-09-06 MED ORDER — ONDANSETRON 4 MG PO TBDP: 8.0000 mg | ORAL_TABLET | Freq: Once | ORAL | Status: DC

## 2018-09-06 MED ORDER — SALINE SPRAY 0.65 % NA SOLN: 1.0000 | NASAL | 0 refills | Status: DC | PRN

## 2018-09-06 MED ORDER — AMOXICILLIN-POT CLAVULANATE 875-125 MG PO TABS: 1.0000 | ORAL_TABLET | Freq: Two times a day (BID) | ORAL | 0 refills | Status: AC

## 2018-09-06 MED FILL — AMOX-CLAV 875-125 MG TABLET: 875-125 | 7 days supply | Qty: 14 | Fill #0

## 2018-09-06 MED FILL — ONDANSETRON ODT 8 MG TABLET: 8 | 7 days supply | Qty: 20 | Fill #0

## 2018-09-06 NOTE — Progress Notes (Signed)
MRN: [id number] DOB: [birth year]  Subjective:   Mark Black is a 53 y.o. male presenting for chief complaint of sinus congestion and drainage (x3weeks (mucinex and theraflu) ) .  Reports 3 week hx of worsening illness. Started out with scratchy throat and nasal congestion. Then developed sinus pressure, decreased sense of smell, and ear fullness. Can feel drainage go from one sinus cavity to the other when he lies down. When he blows it out, it is clear.Today, he started having chills and body aches. Notes the post nasal drainage is so bad that it is making him feel nauseous (has always had issues with drainage making him feel nauseous). Had one episode of vomiting today when he got to the office. Head congestion makes him feel lightheaded. Has been taking mucinex and theraflu consistently over the last 3 weeks. No overt dizziness. Denies confusion, visual disturbance, headache, neck pain,  inability to swallow, voice change, dry cough, productive cough, wheezing, shortness of breath, chest tightness and chest pain, abdominal pain and diarrhea. Sick exposure to daughter. PMH of seasonal allergies-not taking daily antihistamine or flonase currently,  controlled T2DM-last A1C in 06/2018 was 5.5-does not check blood sugar anymore because it is so controlled, HTN, and thyroid disorder. Had asthma as a child but has not had any issues. Patient had flu shot this season. Denies smoking. PSH of nasal sinus surgery in 1994, which he resports did not help much with his ability to breathe. Denies any other aggravating or relieving factors, no other questions or concerns.  Review of Systems  Constitutional: Negative for diaphoresis.  Genitourinary: Negative for flank pain, frequency and urgency.  Skin: Negative for rash.  Neurological: Negative for tingling, focal weakness and weakness.  Endo/Heme/Allergies: Negative for polydipsia.    Mark Black has a current medication list which includes the following  prescription(s): amlodipine-benazepril, bisoprolol-hydrochlorothiazide, desvenlafaxine, glucose blood, freestyle, levothyroxine, metformin, amoxicillin-clavulanate, freestyle lite, clomiphene, farxiga, fluticasone, loratadine, methocarbamol, multiple vitamins-minerals, ondansetron, sodium chloride, tamsulosin, triamcinolone cream, and vitamin b-12, and the following Facility-Administered Medications: sodium chloride and ondansetron. Also has No Known Allergies.  Mark Black  has a past medical history of Allergy, Alopecia totalis, Asthma, Chicken pox, Diabetes mellitus without complication ([place]), Hypertension, Thyroid disease, and Vitiligo. Also  has a past surgical history that includes Carpal tunnel release and Nasal sinus surgery (1994).   Objective:   Vitals: BP 110/90   Pulse 95   Temp 97.6 F (36.4 C)   Resp 14   Wt 224 lb 12.8 oz (102 kg)   SpO2 98%   BMI 31.35 kg/m   Physical Exam Vitals signs reviewed.  Constitutional:      General: He is not in acute distress.    Appearance: He is well-developed. He is not toxic-appearing.     Comments: Appears like he does not feel well sitting on exam table.   HENT:     Head: Normocephalic and atraumatic.     Right Ear: Ear canal and external ear normal. A middle ear effusion is present. No mastoid tenderness. Tympanic membrane is not erythematous or bulging.     Left Ear: Ear canal and external ear normal. A middle ear effusion is present. No mastoid tenderness. Tympanic membrane is not erythematous or bulging.     Nose: Mucosal edema (decreased nasal patency b/l, R>L), congestion and rhinorrhea present. Rhinorrhea is clear.     Right Turbinates: Enlarged and swollen.     Left Turbinates: Enlarged and swollen.     Right Sinus: Maxillary sinus  tenderness (R>L) present. No frontal sinus tenderness.     Left Sinus: Maxillary sinus tenderness present. No frontal sinus tenderness.     Mouth/Throat:     Lips: Pink.     Mouth: Mucous membranes  are moist.     Pharynx: Uvula midline. Posterior oropharyngeal erythema present.     Tonsils: No tonsillar exudate or tonsillar abscesses.  Eyes:     Extraocular Movements: Extraocular movements intact.     Conjunctiva/sclera: Conjunctivae normal.     Pupils: Pupils are equal, round, and reactive to light.     Funduscopic exam:    Right eye: No AV nicking or papilledema. Red reflex present.        Left eye: No AV nicking or papilledema. Red reflex present. Neck:     Musculoskeletal: Full passive range of motion without pain and normal range of motion. No edema or neck rigidity.     Trachea: Phonation normal.  Cardiovascular:     Rate and Rhythm: Normal rate and regular rhythm.     Heart sounds: Normal heart sounds.  Pulmonary:     Effort: Pulmonary effort is normal.     Breath sounds: Normal breath sounds. No decreased breath sounds, wheezing, rhonchi or rales.  Abdominal:     General: Abdomen is flat. Bowel sounds are normal.     Palpations: Abdomen is soft.     Tenderness: There is no abdominal tenderness. Negative signs include Murphy's sign and McBurney's sign.  Lymphadenopathy:     Head:     Right side of head: No submental, submandibular, tonsillar, preauricular, posterior auricular or occipital adenopathy.     Left side of head: No submental, submandibular, tonsillar, preauricular, posterior auricular or occipital adenopathy.     Cervical: No cervical adenopathy.     Upper Body:     Right upper body: No supraclavicular adenopathy.     Left upper body: No supraclavicular adenopathy.  Skin:    General: Skin is warm and dry.  Neurological:     Mental Status: He is alert and oriented to person, place, and time.     Cranial Nerves: Cranial nerves are intact.     Coordination: Romberg sign negative. Finger-Nose-Finger Test and Heel to [facility] Test normal.     Gait: Gait and tandem walk normal.     Comments: Strength of b/l upper and lower extremities 5/5.     Results for  orders placed or performed in visit on 09/06/18 (from the past 24 hour(s))  POCT Influenza A/B     Status: None   Collection Time: 09/06/18  1:15 PM  Result Value Ref Range   Influenza A, POC Negative Negative   Influenza B, POC Negative Negative     Pt vomited x 1 time in the office. Given 8mg  zofran and reports nausea significantly improved. Passed PO challenge with ~12 oz of water.   Assessment and Plan :  1. Acute non-recurrent frontal sinusitis Pt initially appeared like he did not feel well. He was initially nauseous and did have one episode of vomiting but after receiving zofran and drinking water, his apperance and nausea improved.  No acute distress. VSS. POC flu test negative. His hx and duration of sx are concerning for bacterial sinusitis.  No acute findings on neuro exam. Normal abdominal exam. Lungs CTAB. No nuchal rigidity. No emergent/urgent issues found on exam.  Rec oral abx, sx tx, and close follow up if sx are not improving.   Advised to f/u with family doctor  if no improvement with treatment plan in 7 days or to seek care sooner at local ED if symptoms worsen/develops new concerning symptoms. Patient education was provided. Patient verbalized understanding of information provided and agrees with plan of care (POC), all questions answered. No barriers to understanding were identified. Red flags discussed in detail. - amoxicillin-clavulanate (AUGMENTIN) 875-125 MG tablet; Take 1 tablet by mouth 2 (two) times daily for 7 days.  Dispense: 14 tablet; Refill: 0 - sodium chloride (OCEAN) 0.65 % SOLN nasal spray; Place 1 spray into both nostrils as needed.  Dispense: 60 mL; Refill: 0  2. Non-intractable vomiting with nausea, unspecified vomiting type - ondansetron (ZOFRAN-ODT) disintegrating tablet 8 mg - POCT Influenza A/B - ondansetron (ZOFRAN-ODT) 8 MG disintegrating tablet; Take 1 tablet (8 mg total) by mouth every 8 (eight) hours as needed for nausea.  Dispense: 20 tablet;  Refill: [place] 09/06/2018 2:03 PM

## 2018-09-06 NOTE — Patient Instructions (Addendum)
Sinusitis, Adult  Take antibiotic as prescribed. Use zofran as prescribed as needed for nausea. Start flonase (2 sprays in the morning) and nasal saline at night time.  Start daily antihistamine (zyrtec or claritin or allegra). If you start to have any fever, confusion, vomiting that won't stop, visual disturbance, severe dizziness, unstable gait, numbness, tingling, abdominal pain, or any other concerning symptoms, please seek care immediately at the ED. If any of your symptoms do not fully resolve after 7 days of treatment,  please follow up with your family doctor.  Sinusitis is soreness and swelling (inflammation) of your sinuses. Sinuses are hollow spaces in the bones around your face. They are located:  Around your eyes.  In the middle of your forehead.  Behind your nose.  In your cheekbones. Your sinuses and nasal passages are lined with a fluid called mucus. Mucus drains out of your sinuses. Swelling can trap mucus in your sinuses. This lets germs (bacteria, virus, or fungus) grow, which leads to infection. Most of the time, this condition is caused by a virus. What are the causes? This condition is caused by:  Allergies.  Asthma.  Germs.  Things that block your nose or sinuses.  Growths in the nose (nasal polyps).  Chemicals or irritants in the air.  Fungus (rare). What increases the risk? You are more likely to develop this condition if:  You have a weak body defense system (immune system).  You do a lot of swimming or diving.  You use nasal sprays too much.  You smoke. What are the signs or symptoms? The main symptoms of this condition are pain and a feeling of pressure around the sinuses. Other symptoms include:  Stuffy nose (congestion).  Runny nose (drainage).  Swelling and warmth in the sinuses.  Headache.  Toothache.  A cough that may get worse at night.  Mucus that collects in the throat or the back of the nose (postnasal drip).  Being  unable to smell and taste.  Being very tired (fatigue).  A fever.  Sore throat.  Bad breath. How is this diagnosed? This condition is diagnosed based on:  Your symptoms.  Your medical history.  A physical exam.  Tests to find out if your condition is short-term (acute) or long-term (chronic). Your doctor may: ? Check your nose for growths (polyps). ? Check your sinuses using a tool that has a light (endoscope). ? Check for allergies or germs. ? Do imaging tests, such as an MRI or CT scan. How is this treated? Treatment for this condition depends on the cause and whether it is short-term or long-term.  If caused by a virus, your symptoms should go away on their own within 10 days. You may be given medicines to relieve symptoms. They include: ? Medicines that shrink swollen tissue in the nose. ? Medicines that treat allergies (antihistamines). ? A spray that treats swelling of the nostrils. ? Rinses that help get rid of thick mucus in your nose (nasal saline washes).  If caused by bacteria, your doctor may wait to see if you will get better without treatment. You may be given antibiotic medicine if you have: ? A very bad infection. ? A weak body defense system.  If caused by growths in the nose, you may need to have surgery. Follow these instructions at home: Medicines  Take, use, or apply over-the-counter and prescription medicines only as told by your doctor. These may include nasal sprays.  If you were prescribed an antibiotic medicine,  take it as told by your doctor. Do not stop taking the antibiotic even if you start to feel better. Hydrate and humidify   Drink enough water to keep your pee (urine) pale yellow.  Use a cool mist humidifier to keep the humidity level in your home above 50%.  Breathe in steam for 10-15 minutes, 3-4 times a day, or as told by your doctor. You can do this in the bathroom while a hot shower is running.  Try not to spend time in cool  or dry air. Rest  Rest as much as you can.  Sleep with your head raised (elevated).  Make sure you get enough sleep each night. General instructions   Put a warm, moist washcloth on your face 3-4 times a day, or as often as told by your doctor. This will help with discomfort.  Wash your hands often with soap and water. If there is no soap and water, use hand sanitizer.  Do not smoke. Avoid being around people who are smoking (secondhand smoke).  Keep all follow-up visits as told by your doctor. This is important. Contact a doctor if:  You have a fever.  Your symptoms get worse.  Your symptoms do not get better within 10 days. Get help right away if:  You have a very bad headache.  You cannot stop throwing up (vomiting).  You have very bad pain or swelling around your face or eyes.  You have trouble seeing.  You feel confused.  Your neck is stiff.  You have trouble breathing. Summary  Sinusitis is swelling of your sinuses. Sinuses are hollow spaces in the bones around your face.  This condition is caused by tissues in your nose that become inflamed or swollen. This traps germs. These can lead to infection.  If you were prescribed an antibiotic medicine, take it as told by your doctor. Do not stop taking it even if you start to feel better.  Keep all follow-up visits as told by your doctor. This is important. This information is not intended to replace advice given to you by your health care provider. Make sure you discuss any questions you have with your health care provider. Document Released: 12/16/2007 Document Revised: 11/29/2017 Document Reviewed: 11/29/2017 Elsevier Interactive Patient Education  2019 [employer].

## 2018-09-08 ENCOUNTER — Telehealth: Payer: Self-pay | Admitting: Emergency Medicine

## 2018-09-08 MED FILL — DESVENLAFAXINE SUC ER 100 M: 100 | 90 days supply | Qty: 90 | Fill #3

## 2018-09-08 NOTE — Telephone Encounter (Signed)
Left message following up on visit with [facility]

## 2018-09-19 MED FILL — FARXIGA 5 MG TABLET: 5 | 30 days supply | Qty: 30 | Fill #2

## 2018-09-19 MED FILL — BISOPROLOL-HCTZ 5-6.25 MG T: 5-6.25 | 90 days supply | Qty: 90 | Fill #1

## 2018-09-19 MED FILL — TAMSULOSIN HCL 0.4 MG CAP: 0.4 | 30 days supply | Qty: 30 | Fill #1 | Status: TO

## 2018-09-28 ENCOUNTER — Other Ambulatory Visit: Payer: Self-pay | Admitting: Endocrinology

## 2018-09-28 MED FILL — CLOMIPHENE CITRATE 50 MG TA: 50 | 84 days supply | Qty: 12 | Fill #0

## 2018-10-07 ENCOUNTER — Other Ambulatory Visit: Payer: Self-pay | Admitting: Endocrinology

## 2018-10-07 MED FILL — LEVOTHYROXINE 150 MCG TAB: 150 | 90 days supply | Qty: 90 | Fill #0

## 2018-10-13 ENCOUNTER — Other Ambulatory Visit: Payer: Self-pay | Admitting: Endocrinology

## 2018-10-13 MED FILL — metFORMIN HCL ER 500 MG TB2: 500 | 90 days supply | Qty: 270 | Fill #0

## 2018-10-13 MED FILL — FARXIGA 5 MG TABLET: 5 | 30 days supply | Qty: 30 | Fill #0

## 2018-10-24 ENCOUNTER — Other Ambulatory Visit (INDEPENDENT_AMBULATORY_CARE_PROVIDER_SITE_OTHER): Payer: No Typology Code available for payment source

## 2018-10-24 ENCOUNTER — Other Ambulatory Visit: Payer: Self-pay

## 2018-10-24 DIAGNOSIS — E063 Autoimmune thyroiditis: Secondary | ICD-10-CM | POA: Diagnosis not present

## 2018-10-24 DIAGNOSIS — E291 Testicular hypofunction: Secondary | ICD-10-CM | POA: Diagnosis not present

## 2018-10-24 DIAGNOSIS — E119 Type 2 diabetes mellitus without complications: Secondary | ICD-10-CM | POA: Diagnosis not present

## 2018-10-24 LAB — COMPREHENSIVE METABOLIC PANEL
ALT: 25 U/L (ref 0–53)
AST: 24 U/L (ref 0–37)
Albumin: 4.4 g/dL (ref 3.5–5.2)
Alkaline Phosphatase: 47 U/L (ref 39–117)
BUN: 16 mg/dL (ref 6–23)
CO2: 26 mEq/L (ref 19–32)
Calcium: 9 mg/dL (ref 8.4–10.5)
Chloride: 101 mEq/L (ref 96–112)
Creatinine, Ser: 0.77 mg/dL (ref 0.40–1.50)
GFR: 104.97 mL/min (ref >60.00)
Glucose, Bld: 127 mg/dL — ABNORMAL HIGH (ref 70–99)
Potassium: 4.3 mEq/L (ref 3.5–5.1)
Sodium: 138 mEq/L (ref 135–145)
Total Bilirubin: 0.5 mg/dL (ref 0.2–1.2)
Total Protein: 6.5 g/dL (ref 6.0–8.3)

## 2018-10-24 LAB — T4, FREE: Free T4: 0.95 ng/dL (ref 0.60–1.60)

## 2018-10-24 LAB — LIPID PANEL
Cholesterol: 97 mg/dL (ref 0–200)
HDL: 37.4 mg/dL — ABNORMAL LOW (ref >39.00)
LDL Cholesterol: 22 mg/dL (ref 0–99)
NonHDL: 59.56
Total CHOL/HDL Ratio: 3
Triglycerides: 187 mg/dL — ABNORMAL HIGH (ref 0.0–149.0)
VLDL: 37.4 mg/dL (ref 0.0–40.0)

## 2018-10-24 LAB — TESTOSTERONE: Testosterone: 553.54 ng/dL (ref 300.00–890.00)

## 2018-10-24 LAB — HEMOGLOBIN A1C: Hgb A1c MFr Bld: 5.6 % (ref 4.6–6.5)

## 2018-10-24 LAB — TSH: TSH: 2.12 u[IU]/mL (ref 0.35–4.50)

## 2018-10-24 MED FILL — TAMSULOSIN HCL 0.4 MG CAP: 0.4 | 30 days supply | Qty: 30 | Fill #0

## 2018-10-27 ENCOUNTER — Encounter: Payer: No Typology Code available for payment source | Admitting: Endocrinology

## 2018-10-27 ENCOUNTER — Ambulatory Visit (INDEPENDENT_AMBULATORY_CARE_PROVIDER_SITE_OTHER): Payer: No Typology Code available for payment source | Admitting: Endocrinology

## 2018-10-27 ENCOUNTER — Other Ambulatory Visit: Payer: Self-pay

## 2018-10-27 ENCOUNTER — Encounter: Payer: Self-pay | Admitting: Endocrinology

## 2018-10-27 DIAGNOSIS — E291 Testicular hypofunction: Secondary | ICD-10-CM

## 2018-10-27 DIAGNOSIS — E786 Lipoprotein deficiency: Secondary | ICD-10-CM | POA: Diagnosis not present

## 2018-10-27 DIAGNOSIS — I10 Essential (primary) hypertension: Secondary | ICD-10-CM

## 2018-10-27 DIAGNOSIS — E119 Type 2 diabetes mellitus without complications: Secondary | ICD-10-CM | POA: Diagnosis not present

## 2018-10-27 DIAGNOSIS — E063 Autoimmune thyroiditis: Secondary | ICD-10-CM | POA: Diagnosis not present

## 2018-10-27 DIAGNOSIS — E781 Pure hyperglyceridemia: Secondary | ICD-10-CM

## 2018-10-27 NOTE — Progress Notes (Signed)
Patient ID: Mark Black, male   DOB: Sep 24, 1965, 53 y.o.   MRN: [id number]   Today's office visit was provided via telemedicine using video technique Explained to the patient and the the limitations of evaluation and management by telemedicine and the availability of in person appointments.  The patient understood the limitations and agreed to proceed. Patient also understood that the telehealth visit is billable. . Location of the patient: Home . Location of the provider: Office Only the patient and myself were participating in the encounter  The relevant sections of his medical record including recent labs were reviewed prior to starting telehealth session   Reason for Appointment:   follow-up of various problems    History of Present Illness:   DIABETES: This has been previously mild and diagnosed on his glucose tolerance test His fasting glucose has been high since 2011 when it was 109.    GTT in 12/13 showed FASTING glucose of 121, two-hour reading of 245. He was started on metformin ER 1000 mg after the GTT results Currently taking the same dose, has been advised 1500 mg previously but he forgets  RECENT history:  Current oral hypoglycemic drugs: Metformin ER 1500 mg daily, Farxiga 5 mg daily  His A1c is still excellent at 5.6, previous range 5.5-7.9  Current blood sugar patterns, diabetes management and problems identified:   He has not checked his blood sugars at home for several weeks now  He says that his blood sugars have been previously running fairly good and he is checking them  He is not consistently watching his diet insulin has not lost any weight  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 but does not do any formal exercise and otherwise is sedentary  Nonfasting lab glucose was 127  He has been on Farxiga since 9/19   Home glucose readings from download  Fasting 115-140, midmorning  126-144 and afternoon/evening 99/74 with only 2 readings AVERAGE 119    Wt Readings from Last 3 Encounters:  09/06/18 224 lb 12.8 oz (102 kg)  07/01/18 221 lb (100.2 kg)  06/29/18 223 lb 6.4 oz (101.3 kg)    Lab Results  Component Value Date   HGBA1C 5.6 10/24/2018   HGBA1C 5.5 06/24/2018   HGBA1C 7.9 (H) 03/17/2018   Lab Results  Component Value Date   MICROALBUR 2.7 (H) 03/17/2018   [place] 22 10/24/2018   CREATININE 0.77 10/24/2018   Hypertension:    He has had long-standing hypertension  He has been on 5 mg Ziac since 2013 in addition to maximum dose Lotrel   Blood pressure is variably controlled Although he has a monitor at home he has not checked it recently Also on Farxiga   BP Readings from Last 3 Encounters:  09/06/18 110/90  07/01/18 118/78  06/29/18 140/78     Hypothyroidism:   The hypothyroidism was first diagnosed In 1996 with weight gain and feeling sluggish   The treatments that the patient has taken include Synthroid brand name and more recently levothyroxine Did not complain of any fatigue recently   TSH previously was consistently normal His dose was increased up to 8 tablets a week of the 150 mcg levothyroxine in 9/19 and TSH is again normal He has been taking his regimen consistently every day   Lab Results  Component Value Date   TSH 2.12 10/24/2018   TSH 1.20 04/18/2018   TSH 5.17 (H) 03/17/2018   FREET4 0.95 10/24/2018  FREET4 1.13 12/11/2013   FREET4 1.27 06/06/2013    DEPRESSION: Has history of long-standing of depression, he has been on 100 mg of Pristiq with good control of his anxiety and depression He has not discussed continuing this treatment with his PCP as yet      HYPOGONADISM:  He has had fatigue and decreased libido for the last few years His evaluation had shown and normal LH and prolactin levels along with significantly decreased free testosterone  He had been empirically started on clomiphene 25 mg twice a  week in 11/2015 and with this his energy level and testosterone level had improved significantly  Testosterone level was low when he had stopped his medications on his own in September 2019 He was started back on clomiphene half tablet twice a week at that time  He feels fairly good at this time and has no recurrence of fatigue or decreased libido Testosterone level is significantly higher    Lab Results  Component Value Date   TESTOSTERONE 553.54 10/24/2018   TESTOSTERONE 371.45 06/24/2018   TESTOSTERONE 275.69 (L) 03/17/2018   TESTOSTERONE 542.77 08/26/2017    LABS:  Lab on 10/24/2018  Component Date Value Ref Range Status  . Testosterone 10/24/2018 553.54  300.00 - 890.00 ng/dL Final  . Free T4 10/24/2018 0.95  0.60 - 1.60 ng/dL Final   Comment: Specimens from patients who are undergoing biotin therapy and /or ingesting biotin supplements may contain high levels of biotin.  The higher biotin concentration in these specimens interferes with this Free T4 assay.  Specimens that contain high levels  of biotin may cause false high results for this Free T4 assay.  Please interpret results in light of the total clinical presentation of the patient.    Marland Kitchen TSH 10/24/2018 2.12  0.35 - 4.50 uIU/mL Final  . Cholesterol 10/24/2018 97  0 - 200 mg/dL Final   ATP III Classification       Desirable:  < 200 mg/dL               Borderline High:  200 - 239 mg/dL          High:  > = 240 mg/dL  . Triglycerides 10/24/2018 187.0* 0.0 - 149.0 mg/dL Final   Normal:  <150 mg/dLBorderline High:  150 - 199 mg/dL  . HDL 10/24/2018 37.40* >39.00 mg/dL Final  . VLDL 10/24/2018 37.4  0.0 - 40.0 mg/dL Final  . LDL Cholesterol 10/24/2018 22  0 - 99 mg/dL Final  . Total CHOL/HDL Ratio 10/24/2018 3   Final                  Men          Women1/2 Average Risk     3.4          3.3Average Risk          5.0          4.42X Average Risk          9.6          7.13X Average Risk          15.0          11.0                       . NonHDL 10/24/2018 59.56   Final   NOTE:  Non-HDL goal should be 30 mg/dL higher than patient's LDL goal (i.e. LDL goal of < 70 mg/dL, would  have non-HDL goal of < 100 mg/dL)  . Sodium 10/24/2018 138  135 - 145 mEq/L Final  . Potassium 10/24/2018 4.3  3.5 - 5.1 mEq/L Final  . Chloride 10/24/2018 101  96 - 112 mEq/L Final  . CO2 10/24/2018 26  19 - 32 mEq/L Final  . Glucose, Bld 10/24/2018 127* 70 - 99 mg/dL Final  . BUN 10/24/2018 16  6 - 23 mg/dL Final  . Creatinine, Ser 10/24/2018 0.77  0.40 - 1.50 mg/dL Final  . Total Bilirubin 10/24/2018 0.5  0.2 - 1.2 mg/dL Final  . Alkaline Phosphatase 10/24/2018 47  39 - 117 U/L Final  . AST 10/24/2018 24  0 - 37 U/L Final  . ALT 10/24/2018 25  0 - 53 U/L Final  . Total Protein 10/24/2018 6.5  6.0 - 8.3 g/dL Final  . Albumin 10/24/2018 4.4  3.5 - 5.2 g/dL Final  . Calcium 10/24/2018 9.0  8.4 - 10.5 mg/dL Final  . GFR 10/24/2018 104.97  >60.00 mL/min Final  . Hgb A1c MFr Bld 10/24/2018 5.6  4.6 - 6.5 % Final   Glycemic Control Guidelines for People with Diabetes:Non Diabetic:  <6%Goal of Therapy: <7%Additional Action Suggested:  >8%     Allergies as of 10/27/2018   No Known Allergies     Medication List       Accurate as of October 27, 2018  2:14 PM. Always use your most recent med list.        amLODipine-benazepril 10-40 MG capsule Commonly known as:  LOTREL TAKE 1 CAPSULE BY MOUTH DAILY.   bisoprolol-hydrochlorothiazide 5-6.25 MG tablet Commonly known as:  ZIAC TAKE 1 TABLET BY MOUTH DAILY.   clomiPHENE 50 MG tablet Commonly known as:  CLOMID TAKE 1/2 TABLET BY MOUTH TWICE WEEKLY   desvenlafaxine 100 MG 24 hr tablet Commonly known as:  PRISTIQ TAKE 1 TABLET (100 MG TOTAL) BY MOUTH DAILY.   Farxiga 5 MG Tabs tablet Generic drug:  dapagliflozin propanediol TAKE 1 TABLET BY MOUTH DAILY   fluticasone 50 MCG/ACT nasal spray Commonly known as:  FLONASE Place 2 sprays into both nostrils daily.   freestyle lancets Use as  instructed   FreeStyle Lite Devi Use as instructed to check blood sugar once a day at various times   glucose blood test strip Use as instructed   KLS AllerClear 10 MG tablet Generic drug:  loratadine Take 10 mg by mouth daily.   levothyroxine 150 MCG tablet Commonly known as:  SYNTHROID TAKE 1 TABLET BY MOUTH DAILY.   metFORMIN 500 MG 24 hr tablet Commonly known as:  GLUCOPHAGE-XR Take 3 tablets (1,500 mg total) by mouth daily with supper.   methocarbamol 750 MG tablet Commonly known as:  ROBAXIN Take 1 tablet (750 mg total) by mouth every 8 (eight) hours as needed for muscle spasms.   MULTIVITAMIN ADULT PO Take by mouth.   ondansetron 8 MG disintegrating tablet Commonly known as:  ZOFRAN-ODT Take 1 tablet (8 mg total) by mouth every 8 (eight) hours as needed for nausea.   sodium chloride 0.65 % Soln nasal spray Commonly known as:  OCEAN Place 1 spray into both nostrils as needed.   tamsulosin 0.4 MG Caps capsule Commonly known as:  FLOMAX TAKE 1 CAPSULE BY MOUTH DAILY AFTER SUPPER.   triamcinolone cream 0.1 % Commonly known as:  KENALOG Apply 1 application topically 2 (two) times daily.   vitamin B-12 1000 MCG tablet Commonly known as:  CYANOCOBALAMIN Take 1,000 mcg by mouth daily.  Past Medical History:  Diagnosis Date  . Allergy   . Alopecia totalis   . Asthma    as a child  . Chicken pox   . Diabetes mellitus without complication ([place])   . Hypertension   . Thyroid disease   . Vitiligo     Past Surgical History:  Procedure Laterality Date  . CARPAL TUNNEL RELEASE     [place]  . NASAL SINUS SURGERY  1994   cut and open air passages/nostrils    Family History  Problem Relation Age of Onset  . Diabetes Father   . Hypertension Father   . Diabetes Paternal Aunt   . Cancer Maternal Aunt        breast  . Diabetes Sister   . Hypertension Sister   . Diabetes Brother   . Colon cancer Neg Hx   . Colon polyps Neg Hx   . Esophageal cancer Neg  Hx   . Stomach cancer Neg Hx   . Rectal cancer Neg Hx     Social History:  reports that he has never smoked. He has never used smokeless tobacco. He reports previous alcohol use. He reports previous drug use.   Allergies: No Known Allergies  REVIEW of systems:.   No history of hyperlipidemia.   His total cholesterol is below 100 Also may have some metabolic syndrome effects with mildly increased triglycerides and low HDL    Lab Results  Component Value Date   CHOL 97 10/24/2018   HDL 37.40 (L) 10/24/2018   LDLCALC 22 10/24/2018   TRIG 187.0 (H) 10/24/2018   CHOLHDL 3 10/24/2018                            Physical Exam  There were no vitals taken for this visit.     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 with now excellent at 5.5  With metformin and Farxiga his blood sugars have improved significantly He is also trying to improve his diet Exercise has been inconsistent Discussed that since he is not seen frequently he should periodically check his blood sugars at home including after meals, blood sugar targets discussed To start regular walking on the days he is not excessively busy  HYPOTHYROIDISM treated with 130mcg levothyroxine, 8 days a week Subjectively doing well and TSH is consistently normal with this dose TSH will be checked again on the next visit Reminded him to take this the same way and also to take it without any food in the morning   Decreased libido and HYPOGONADISM:   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  With resuming clomiphene 25 mg twice  a week his testosterone is progressively higher Not clear if he needs to continue this with his testosterone level over 550 He can now reduce the dose to once a week May consider stopping if testosterone level is still normal   Hypertension: Blood pressure needs to be checked at home at least weekly Discussed systolic and diastolic blood pressure targets  Renal function and potassium stable  LIPIDS: Mild increase  in triglycerides which may improve with some weight loss and better diet  Patient recommendations were sent in a MyChart message  Total visit time for evaluation and management of multiple problems and counseling =25 minutes   There are no Patient Instructions on file for this visit.  Elayne Snare 10/27/2018, 2:14 PM   Note: This office note was prepared with Dragon voice recognition system technology. Any transcriptional errors that result from this process are unintentional.

## 2018-10-28 NOTE — Progress Notes (Signed)
This encounter was created in error - please disregard.

## 2018-11-14 MED FILL — AMLODIPINE-BENAZ 10-40 MG: 10-40 | 90 days supply | Qty: 90 | Fill #0

## 2018-11-29 MED FILL — TAMSULOSIN HCL 0.4 MG CAP: 0.4 | 30 days supply | Qty: 30 | Fill #1

## 2018-12-07 ENCOUNTER — Encounter: Payer: Self-pay | Admitting: Family Medicine

## 2018-12-07 ENCOUNTER — Telehealth (INDEPENDENT_AMBULATORY_CARE_PROVIDER_SITE_OTHER): Payer: No Typology Code available for payment source | Admitting: Family Medicine

## 2018-12-07 DIAGNOSIS — A084 Viral intestinal infection, unspecified: Secondary | ICD-10-CM | POA: Diagnosis not present

## 2018-12-07 NOTE — Progress Notes (Signed)
Mark Black - 53 y.o. male MRN [id number]  Date of birth: July 01, 1966   This visit type was conducted due to national recommendations for restrictions regarding the COVID-19 Pandemic (e.g. social distancing).  This format is felt to be most appropriate for this patient at this time.  All issues noted in this document were discussed and addressed.  No physical exam was performed (except for noted visual exam findings with Video Visits).  I discussed the limitations of evaluation and management by telemedicine and the availability of in person appointments. The patient expressed understanding and agreed to proceed.  I connected with@ on 12/07/18 at  2:00 PM EDT by a video enabled telemedicine application and verified that I am speaking with the correct person using two identifiers.   Patient Location: Home [address] [place] [address]   Provider location:   [facility]   Chief Complaint  Patient presents with  . Abdominal Cramping    Vomiting/headache/ no fever, onset : 24/36 hrs , nueas  & diarreah     HPI  Mark Black is a 53 y.o. male who presents via audio/video conferencing for a telehealth visit today.  He has complaint of gastrointestinal symptoms of watery diarrhea, lower abdominal cramps, upper abdominal cramps, nausea, vomiting for 2 days. No blood in stool or vomit.  He denies fever, chills, rash, respiratory symptoms or body aches.  He has had some improvement with pepto bismol  ROS:  A comprehensive ROS was completed and negative except as noted per HPI  Past Medical History:  Diagnosis Date  . Allergy   . Alopecia totalis   . Asthma    as a child  . Chicken pox   . Diabetes mellitus without complication ([place])   . Hypertension   . Thyroid disease   . Vitiligo     Past Surgical History:  Procedure Laterality Date  . CARPAL TUNNEL RELEASE     Bil  . NASAL SINUS SURGERY  1994   cut and open air passages/nostrils    Family History   Problem Relation Age of Onset  . Diabetes Father   . Hypertension Father   . Diabetes Paternal Aunt   . Cancer Maternal Aunt        breast  . Diabetes Sister   . Hypertension Sister   . Diabetes Brother   . Colon cancer Neg Hx   . Colon polyps Neg Hx   . Esophageal cancer Neg Hx   . Stomach cancer Neg Hx   . Rectal cancer Neg Hx     Social History   Socioeconomic History  . Marital status: Married    Spouse name: Not on file  . Number of children: Not on file  . Years of education: Not on file  . Highest education level: Not on file  Occupational History  . Not on file  Social Needs  . Financial resource strain: Not on file  . Food insecurity:    Worry: Not on file    Inability: Not on file  . Transportation needs:    Medical: Not on file    Non-medical: Not on file  Tobacco Use  . Smoking status: Never Smoker  . Smokeless tobacco: Never Used  Substance and Sexual Activity  . Alcohol use: Not Currently    Frequency: Never  . Drug use: Not Currently  . Sexual activity: Not on file  Lifestyle  . Physical activity:    Days per week: Not on file  Minutes per session: Not on file  . Stress: Not on file  Relationships  . Social connections:    Talks on phone: Not on file    Gets together: Not on file    Attends religious service: Not on file    Active member of club or organization: Not on file    Attends meetings of clubs or organizations: Not on file    Relationship status: Not on file  . Intimate partner violence:    Fear of current or ex partner: Not on file    Emotionally abused: Not on file    Physically abused: Not on file    Forced sexual activity: Not on file  Other Topics Concern  . Not on file  Social History Narrative  . Not on file     Current Outpatient Medications:  .  amLODipine-benazepril (LOTREL) 10-40 MG capsule, TAKE 1 CAPSULE BY MOUTH DAILY., Disp: 90 capsule, Rfl: 1 .  bisoprolol-hydrochlorothiazide (ZIAC) 5-6.25 MG tablet, TAKE  1 TABLET BY MOUTH DAILY., Disp: 90 tablet, Rfl: 1 .  Blood Glucose Monitoring Suppl (FREESTYLE LITE) DEVI, Use as instructed to check blood sugar once a day at various times, Disp: 100 each, Rfl: 3 .  clomiPHENE (CLOMID) 50 MG tablet, TAKE 1/2 TABLET BY MOUTH TWICE WEEKLY, Disp: 12 tablet, Rfl: 1 .  desvenlafaxine (PRISTIQ) 100 MG 24 hr tablet, TAKE 1 TABLET (100 MG TOTAL) BY MOUTH DAILY., Disp: 90 tablet, Rfl: 3 .  FARXIGA 5 MG TABS tablet, TAKE 1 TABLET BY MOUTH DAILY (Patient taking differently: Take 10 mg by mouth once a week. ), Disp: 30 tablet, Rfl: 2 .  fluticasone (FLONASE) 50 MCG/ACT nasal spray, Place 2 sprays into both nostrils daily., Disp: 16 g, Rfl: 6 .  glucose blood test strip, Use as instructed, Disp: 100 each, Rfl: 12 .  Lancets (FREESTYLE) lancets, Use as instructed, Disp: 100 each, Rfl: 12 .  levothyroxine (SYNTHROID, LEVOTHROID) 150 MCG tablet, TAKE 1 TABLET BY MOUTH DAILY., Disp: 90 tablet, Rfl: 1 .  loratadine (KLS ALLERCLEAR) 10 MG tablet, Take 10 mg by mouth daily., Disp: , Rfl:  .  metFORMIN (GLUCOPHAGE-XR) 500 MG 24 hr tablet, Take 3 tablets (1,500 mg total) by mouth daily with supper., Disp: 270 tablet, Rfl: 3 .  methocarbamol (ROBAXIN) 750 MG tablet, Take 1 tablet (750 mg total) by mouth every 8 (eight) hours as needed for muscle spasms., Disp: 45 tablet, Rfl: 1 .  Multiple Vitamins-Minerals (MULTIVITAMIN ADULT PO), Take by mouth., Disp: , Rfl:  .  ondansetron (ZOFRAN-ODT) 8 MG disintegrating tablet, Take 1 tablet (8 mg total) by mouth every 8 (eight) hours as needed for nausea., Disp: 20 tablet, Rfl: 0 .  sodium chloride (OCEAN) 0.65 % SOLN nasal spray, Place 1 spray into both nostrils as needed., Disp: 60 mL, Rfl: 0 .  tamsulosin (FLOMAX) 0.4 MG CAPS capsule, TAKE 1 CAPSULE BY MOUTH DAILY AFTER SUPPER., Disp: 30 capsule, Rfl: 3 .  triamcinolone cream (KENALOG) 0.1 %, Apply 1 application topically 2 (two) times daily., Disp: 30 g, Rfl: 0 .  vitamin B-12 (CYANOCOBALAMIN)  1000 MCG tablet, Take 1,000 mcg by mouth daily., Disp: , Rfl:   Current Facility-Administered Medications:  .  0.9 %  sodium chloride infusion, 500 mL, Intravenous, Once, Armbruster, Carlota Raspberry, MD .  ondansetron (ZOFRAN-ODT) disintegrating tablet 8 mg, 8 mg, Oral, Once, Wiseman, [place] D, PA-C  EXAM:  VITALS per patient if applicable: BP 834/19 Comment: bp taken at hm  Pulse 81  Temp (!) 96.6 F (35.9 C) (Oral)   Ht 5' 10.5" (1.791 m)   Wt 224 lb (101.6 kg) Comment: pt reported from hm  BMI 31.69 kg/m   GENERAL: alert, oriented, appears well and in no acute distress  HEENT: atraumatic, conjunttiva clear, no obvious abnormalities on inspection of external nose and ears  NECK: normal movements of the head and neck  LUNGS: on inspection no signs of respiratory distress, breathing rate appears normal, no obvious gross SOB, gasping or wheezing  CV: no obvious cyanosis  MS: moves all visible extremities without noticeable abnormality  PSYCH/NEURO: pleasant and cooperative, no obvious depression or anxiety, speech and thought processing grossly intact  ASSESSMENT AND PLAN:  Discussed the following assessment and plan:  Viral gastroenteritis -Consistent with viral gastroenteritis, symptoms seem to be improving.  -He may continue symptomatic treatment as needed.  -Push fluids and rest.  -Red flags reviewed.        I discussed the assessment and treatment plan with the patient. The patient was provided an opportunity to ask questions and all were answered. The patient agreed with the plan and demonstrated an understanding of the instructions.   The patient was advised to call back or seek an in-person evaluation if the symptoms worsen or if the condition fails to improve as anticipated.     Luetta Nutting, DO

## 2018-12-07 NOTE — Patient Instructions (Signed)
Viral Gastroenteritis, Adult    Viral gastroenteritis is also known as the stomach flu. This condition is caused by certain germs (viruses). These germs can be passed from person to person very easily (are very contagious). This condition can cause sudden watery poop (diarrhea), fever, and throwing up (vomiting).  Having watery poop and throwing up can make you feel weak and cause you to get dehydrated. Dehydration can make you tired and thirsty, make you have a dry mouth, and make it so you pee (urinate) less often. Older adults and people with other diseases or a weak defense system (immune system) are at higher risk for dehydration. It is important to replace the fluids that you lose from having watery poop and throwing up.  Follow these instructions at home:  Follow instructions from your doctor about how to care for yourself at home.  Eating and drinking  Follow these instructions as told by your doctor:   Take an oral rehydration solution (ORS). This is a drink that is sold at pharmacies and stores.   Drink clear fluids in small amounts as you are able, such as:  ? Water.  ? Ice chips.  ? Diluted fruit juice.  ? Low-calorie sports drinks.   Eat bland, easy-to-digest foods in small amounts as you are able, such as:  ? Bananas.  ? Applesauce.  ? Rice.  ? Low-fat (lean) meats.  ? Toast.  ? Crackers.   Avoid fluids that have a lot of sugar or caffeine in them.   Avoid alcohol.   Avoid spicy or fatty foods.  General instructions     Drink enough fluid to keep your pee (urine) clear or pale yellow.   Wash your hands often. If you cannot use soap and water, use hand sanitizer.   Make sure that all people in your home wash their hands well and often.   Rest at home while you get better.   Take over-the-counter and prescription medicines only as told by your doctor.   Watch your condition for any changes.   Take a warm bath to help with any burning or pain from having watery poop.   Keep all follow-up  visits as told by your doctor. This is important.  Contact a doctor if:   You cannot keep fluids down.   Your symptoms get worse.   You have new symptoms.   You feel light-headed or dizzy.   You have muscle cramps.  Get help right away if:   You have chest pain.   You feel very weak or you pass out (faint).   You see blood in your throw-up.   Your throw-up looks like coffee grounds.   You have bloody or black poop (stools) or poop that look like tar.   You have a very bad headache, a stiff neck, or both.   You have a rash.   You have very bad pain, cramping, or bloating in your belly (abdomen).   You have trouble breathing.   You are breathing very quickly.   Your heart is beating very quickly.   Your skin feels cold and clammy.   You feel confused.   You have pain when you pee.   You have signs of dehydration, such as:  ? Dark pee, hardly any pee, or no pee.  ? Cracked lips.  ? Dry mouth.  ? Sunken eyes.  ? Sleepiness.  ? Weakness.  This information is not intended to replace advice given to you by your   health care provider. Make sure you discuss any questions you have with your health care provider.  Document Released: 12/16/2007 Document Revised: 03/23/2018 Document Reviewed: 03/05/2015  Elsevier Interactive Patient Education  2019 [employer]

## 2018-12-07 NOTE — Assessment & Plan Note (Signed)
-  Consistent with viral gastroenteritis, symptoms seem to be improving.  -He may continue symptomatic treatment as needed.  -Push fluids and rest.  -Red flags reviewed.

## 2018-12-08 ENCOUNTER — Other Ambulatory Visit: Payer: Self-pay | Admitting: Endocrinology

## 2018-12-09 MED FILL — DESVENLAFAXINE SUC ER 100 M: 100 | 90 days supply | Qty: 90 | Fill #0

## 2018-12-26 ENCOUNTER — Other Ambulatory Visit: Payer: Self-pay | Admitting: Endocrinology

## 2018-12-26 MED FILL — FARXIGA 5 MG TABLET: 5 | 30 days supply | Qty: 30 | Fill #0

## 2018-12-26 MED FILL — BISOPROLOL-HCTZ 5-6.25 MG T: 5-6.25 | 90 days supply | Qty: 90 | Fill #0

## 2018-12-29 ENCOUNTER — Other Ambulatory Visit: Payer: Self-pay | Admitting: Endocrinology

## 2018-12-29 MED FILL — CLOMIPHENE CITRATE 50 MG TA: 50 | 84 days supply | Qty: 12 | Fill #1

## 2018-12-29 MED FILL — TAMSULOSIN HCL 0.4 MG CAP: 0.4 | 30 days supply | Qty: 30 | Fill #0

## 2018-12-29 MED FILL — LEVOTHYROXINE 150 MCG TAB: 150 | 90 days supply | Qty: 90 | Fill #0

## 2019-01-02 ENCOUNTER — Encounter: Payer: Self-pay | Admitting: Family Medicine

## 2019-01-22 ENCOUNTER — Encounter: Payer: Self-pay | Admitting: Family Medicine

## 2019-01-24 ENCOUNTER — Telehealth: Payer: Self-pay

## 2019-01-24 MED FILL — metFORMIN HCL ER 500 MG TB2: 500 | 90 days supply | Qty: 270 | Fill #0

## 2019-01-24 NOTE — Telephone Encounter (Signed)
Questions for Screening COVID-19  Symptom onset:non. Pt repeated Ph # [id number] for arrival. Pt did report ate some bad food 36 hrs ago , had 1X loose stool and small headache.Only 1 day of Sx/Sy. No fever .  Travel or Contacts:none  During this illness, did/does the patient experience any of the following symptoms? Fever >100.5F []   Yes [x]   No []   Unknown Subjective fever (felt feverish) []   Yes [x]   No []   Unknown Chills []   Yes [x]   No []   Unknown Muscle aches (myalgia) []   Yes [x]   No []   Unknown Runny nose (rhinorrhea) []   Yes [x]   No []   Unknown Sore throat []   Yes [x]   No []   Unknown Cough (new onset or worsening of chronic cough) []   Yes []   No []   Unknown Shortness of breath (dyspnea) []   Yes [x]   No []   Unknown Nausea or vomiting []   Yes [x]   No []   Unknown Headache []   Yes []   No [x]   Unknown Abdominal pain  []   Yes [x]   No []   Unknown Diarrhea (?3 loose/looser than normal stools/24hr period) []   Yes [x]   No []   Unknown Other, specify:  Patient risk factors: Smoker? []   Current []   Former [x]   Never If male, currently pregnant? []   Yes [x]   No  Patient Active Problem List   Diagnosis Date Noted  . Viral gastroenteritis 12/07/2018  . T2DM (type 2 diabetes mellitus) ([place]) 03/21/2018  . Anxiety 12/24/2017  . Allergic rhinitis 12/24/2017  . Lower urinary tract symptoms (LUTS) 12/24/2017  . Screening for colon cancer 12/24/2017  . Essential hypertension 02/10/2013  . Hypothyroidism 02/10/2013    Plan:  []   High risk for COVID-19 with red flags go to ED (with CP, SOB, weak/lightheaded, or fever > 101.5). Call ahead.  []   High risk for COVID-19 but stable. Inform provider and coordinate time for [facility] visit.   [x]   No red flags but URI signs or symptoms okay for [facility] visit.

## 2019-01-25 ENCOUNTER — Encounter: Payer: Self-pay | Admitting: Family Medicine

## 2019-01-25 ENCOUNTER — Ambulatory Visit (INDEPENDENT_AMBULATORY_CARE_PROVIDER_SITE_OTHER): Payer: No Typology Code available for payment source | Admitting: Family Medicine

## 2019-01-25 VITALS — BP 136/92 | HR 86 | Temp 98.1°F | Resp 18 | Ht 70.25 in | Wt 225.0 lb

## 2019-01-25 DIAGNOSIS — R309 Painful micturition, unspecified: Secondary | ICD-10-CM | POA: Diagnosis not present

## 2019-01-25 DIAGNOSIS — K219 Gastro-esophageal reflux disease without esophagitis: Secondary | ICD-10-CM

## 2019-01-25 DIAGNOSIS — F419 Anxiety disorder, unspecified: Secondary | ICD-10-CM

## 2019-01-25 LAB — POCT URINALYSIS DIPSTICK
Bilirubin, UA: NEGATIVE
Blood, UA: NEGATIVE
Glucose, UA: POSITIVE — AB
Ketones, UA: POSITIVE
Leukocytes, UA: NEGATIVE
Nitrite, UA: NEGATIVE
Protein, UA: POSITIVE — AB
Spec Grav, UA: 1.02 (ref 1.010–1.025)
Urobilinogen, UA: 1 E.U./dL
pH, UA: 6 (ref 5.0–8.0)

## 2019-01-25 MED ORDER — BUPROPION HCL ER (XL) 150 MG PO TB24: 150.0000 mg | ORAL_TABLET | Freq: Every day | ORAL | 1 refills | Status: DC

## 2019-01-25 MED ORDER — PANTOPRAZOLE SODIUM 40 MG PO TBEC: 40.0000 mg | DELAYED_RELEASE_TABLET | Freq: Every day | ORAL | 1 refills | Status: DC

## 2019-01-25 MED FILL — PANTOPRAZOLE SOD DR 40 MG T: 40 | 90 days supply | Qty: 90 | Fill #0

## 2019-01-25 MED FILL — buPROPion HCL ER (XL) 150 M: 150 | 90 days supply | Qty: 90 | Fill #0

## 2019-01-25 NOTE — Patient Instructions (Addendum)
Start pantoprazole daily Start bupropion daily.  Let's plan for follow up in about 2 months, we'll update endocrine labs at that time as well.

## 2019-01-26 LAB — PSA: PSA: 0.5 ng/mL (ref <=4.0)

## 2019-01-27 ENCOUNTER — Encounter: Payer: Self-pay | Admitting: Family Medicine

## 2019-01-27 DIAGNOSIS — K219 Gastro-esophageal reflux disease without esophagitis: Secondary | ICD-10-CM | POA: Insufficient documentation

## 2019-01-27 DIAGNOSIS — R309 Painful micturition, unspecified: Secondary | ICD-10-CM | POA: Insufficient documentation

## 2019-01-27 HISTORY — DX: Gastro-esophageal reflux disease without esophagitis: K21.9

## 2019-01-27 NOTE — Progress Notes (Signed)
Mark Black - 53 y.o. male MRN [id number]  Date of birth: Feb 02, 1966  Subjective Chief Complaint  Patient presents with  . Follow-up    Depression/Anxiety, meds not working    HPI Mark Black is a 53 y.o. male with history of HTN, T2DM, hypothyroidism, and anxiety here today to discuss increased depressive and anxiety symptoms.  He reports that family members have noticed that he seems to be a little more anxious and tends to lose his temper a bit easier than he has previously.  Doesn't feel that pristiq is working as well as it had been in the past.  Denies significant increase in stressors.    He also has some issues with reflux symptoms with some nausea, especially in the mornings.  He denies abdominal pain or early satiety.  He has tried limiting late night eating which has helped some.  Denies headache or vision changes.    Also reports some increased urinary hesitancy and occasional pain with urination.  Chronic in nature.   ROS:  A comprehensive ROS was completed and negative except as noted per HPI   No Known Allergies  Past Medical History:  Diagnosis Date  . Allergy   . Alopecia totalis   . Asthma    as a child  . Chicken pox   . Diabetes mellitus without complication ([place])   . Hypertension   . Thyroid disease   . Vitiligo     Past Surgical History:  Procedure Laterality Date  . CARPAL TUNNEL RELEASE     Bil  . NASAL SINUS SURGERY  1994   cut and open air passages/nostrils    Social History   Socioeconomic History  . Marital status: Married    Spouse name: Not on file  . Number of children: Not on file  . Years of education: Not on file  . Highest education level: Not on file  Occupational History  . Not on file  Social Needs  . Financial resource strain: Not on file  . Food insecurity    Worry: Not on file    Inability: Not on file  . Transportation needs    Medical: Not on file    Non-medical: Not on file  Tobacco Use  . Smoking  status: Never Smoker  . Smokeless tobacco: Never Used  Substance and Sexual Activity  . Alcohol use: Not Currently    Frequency: Never  . Drug use: Not Currently  . Sexual activity: Not on file  Lifestyle  . Physical activity    Days per week: Not on file    Minutes per session: Not on file  . Stress: Not on file  Relationships  . Social Herbalist on phone: Not on file    Gets together: Not on file    Attends religious service: Not on file    Active member of club or organization: Not on file    Attends meetings of clubs or organizations: Not on file    Relationship status: Not on file  Other Topics Concern  . Not on file  Social History Narrative  . Not on file    Family History  Problem Relation Age of Onset  . Diabetes Father   . Hypertension Father   . Diabetes Paternal Aunt   . Cancer Maternal Aunt        breast  . Diabetes Sister   . Hypertension Sister   . Diabetes Brother   . Colon cancer Neg Hx   .  Colon polyps Neg Hx   . Esophageal cancer Neg Hx   . Stomach cancer Neg Hx   . Rectal cancer Neg Hx     Health Maintenance  Topic Date Due  . Hepatitis C Screening  February 10, 1966  . OPHTHALMOLOGY EXAM  01/11/1974  . HIV Screening  01/12/1979  . FOOT EXAM  08/21/2016  . INFLUENZA VACCINE  02/11/2019  . HEMOGLOBIN A1C  04/25/2019  . TETANUS/TDAP  01/27/2027  . COLONOSCOPY  05/27/2028  . PNEUMOCOCCAL POLYSACCHARIDE VACCINE AGE 61-64 HIGH RISK  Completed    ----------------------------------------------------------------------------------------------------------------------------------------------------------------------------------------------------------------- Physical Exam BP (!) 136/92   Pulse 86   Temp 98.1 F (36.7 C) (Oral)   Resp 18   Ht 5' 10.25" (1.784 m)   Wt 225 lb (102.1 kg)   SpO2 96%   BMI 32.05 kg/m   Physical Exam Constitutional:      Appearance: Normal appearance.  HENT:     Head: Normocephalic and atraumatic.      Mouth/Throat:     Mouth: Mucous membranes are moist.  Eyes:     General: No scleral icterus. Neck:     Musculoskeletal: Neck supple.  Cardiovascular:     Rate and Rhythm: Normal rate and regular rhythm.  Pulmonary:     Effort: Pulmonary effort is normal.     Breath sounds: Normal breath sounds.  Skin:    General: Skin is warm and dry.  Neurological:     General: No focal deficit present.     Mental Status: He is alert.  Psychiatric:        Mood and Affect: Mood normal.        Behavior: Behavior normal.     ------------------------------------------------------------------------------------------------------------------------------------------------------------------------------------------------------------------- Assessment and Plan  Urinary pain Check UA and PSA  GERD (gastroesophageal reflux disease) Having some morning nausea as well, ?gastroparesis causing.  Will give trial of protonix initially.   Anxiety Discussed options including trying different medication vs augmenting with bupropion.  Will try augmenting initially which he is in agreement with. Return in about 2 months (around 03/28/2019).

## 2019-01-27 NOTE — Assessment & Plan Note (Signed)
Having some morning nausea as well, ?gastroparesis causing.  Will give trial of protonix initially.

## 2019-01-27 NOTE — Assessment & Plan Note (Signed)
Check UA and PSA

## 2019-01-27 NOTE — Assessment & Plan Note (Signed)
Discussed options including trying different medication vs augmenting with bupropion.  Will try augmenting initially which he is in agreement with. Return in about 2 months (around 03/28/2019).

## 2019-02-07 ENCOUNTER — Encounter: Payer: Self-pay | Admitting: Family Medicine

## 2019-02-07 MED FILL — TAMSULOSIN HCL 0.4 MG CAP: 0.4 | 30 days supply | Qty: 30 | Fill #1

## 2019-02-16 ENCOUNTER — Other Ambulatory Visit: Payer: Self-pay | Admitting: Endocrinology

## 2019-02-16 MED FILL — AMLODIPINE-BENAZ 10-40 MG: 10-40 | 90 days supply | Qty: 90 | Fill #0

## 2019-02-21 ENCOUNTER — Encounter: Payer: Self-pay | Admitting: Family Medicine

## 2019-03-06 ENCOUNTER — Other Ambulatory Visit: Payer: No Typology Code available for payment source

## 2019-03-06 ENCOUNTER — Telehealth: Payer: Self-pay | Admitting: Endocrinology

## 2019-03-06 NOTE — Telephone Encounter (Signed)
FYI  Patients wife has called to inform the patients PCP is now taking over his care.

## 2019-03-06 NOTE — Telephone Encounter (Signed)
Noted  

## 2019-03-08 ENCOUNTER — Other Ambulatory Visit: Payer: Self-pay | Admitting: Endocrinology

## 2019-03-08 MED FILL — DESVENLAFAXINE SUC ER 100 M: 100 | 90 days supply | Qty: 90 | Fill #1

## 2019-03-08 MED FILL — TAMSULOSIN HCL 0.4 MG CAP: 0.4 | 30 days supply | Qty: 30 | Fill #0

## 2019-03-09 ENCOUNTER — Ambulatory Visit: Payer: No Typology Code available for payment source | Admitting: Endocrinology

## 2019-03-21 ENCOUNTER — Other Ambulatory Visit: Payer: Self-pay | Admitting: Endocrinology

## 2019-03-21 ENCOUNTER — Other Ambulatory Visit: Payer: Self-pay

## 2019-03-21 MED ORDER — LEVOTHYROXINE SODIUM 150 MCG PO TABS: 150.0000 ug | ORAL_TABLET | Freq: Every day | ORAL | 0 refills | Status: DC

## 2019-03-21 MED FILL — LEVOTHYROXINE 150 MCG TAB: 150 | 90 days supply | Qty: 90 | Fill #0

## 2019-03-21 NOTE — Telephone Encounter (Signed)
To be denied, he is now being followed by PCP

## 2019-03-21 NOTE — Telephone Encounter (Signed)
Noted  

## 2019-03-21 NOTE — Telephone Encounter (Signed)
Has not been seen since 06/2018. Refill or deny?

## 2019-03-28 MED FILL — BISOPROLOL-HCTZ 5-6.25 MG T: 5-6.25 | 90 days supply | Qty: 90 | Fill #1

## 2019-04-10 ENCOUNTER — Other Ambulatory Visit: Payer: Self-pay | Admitting: Endocrinology

## 2019-04-10 MED FILL — TAMSULOSIN HCL 0.4 MG CAP: 0.4 | 30 days supply | Qty: 30 | Fill #1

## 2019-04-10 MED FILL — CLOMIPHENE CITRATE 50 MG TA: 50 | 84 days supply | Qty: 12 | Fill #0

## 2019-04-20 ENCOUNTER — Other Ambulatory Visit: Payer: Self-pay | Admitting: Endocrinology

## 2019-04-20 MED FILL — FARXIGA 5 MG TABLET: 5 | 30 days supply | Qty: 30 | Fill #1

## 2019-04-20 MED FILL — metFORMIN HCL ER 500 MG TB2: 500 | 90 days supply | Qty: 270 | Fill #0

## 2019-04-28 MED FILL — buPROPion HCL ER (XL) 150 M: 150 | 90 days supply | Qty: 90 | Fill #1

## 2019-05-08 MED FILL — PANTOPRAZOLE SOD DR 40 MG T: 40 | 90 days supply | Qty: 90 | Fill #1

## 2019-05-23 ENCOUNTER — Other Ambulatory Visit: Payer: Self-pay | Admitting: Endocrinology

## 2019-05-23 MED FILL — AMLODIPINE-BENAZ 10-40 MG: 10-40 | 90 days supply | Qty: 90 | Fill #0

## 2019-05-23 MED FILL — TAMSULOSIN HCL 0.4 MG CAP: 0.4 | 90 days supply | Qty: 90 | Fill #0

## 2019-06-12 ENCOUNTER — Other Ambulatory Visit: Payer: Self-pay | Admitting: Family Medicine

## 2019-06-12 MED FILL — LEVOTHYROXINE SODIUM 150 MC: 150 | 90 days supply | Qty: 90 | Fill #0

## 2019-06-12 MED FILL — DESVENLAFAXINE SUC ER 100 M: 100 | 90 days supply | Qty: 90 | Fill #2

## 2019-06-26 ENCOUNTER — Encounter: Payer: Self-pay | Admitting: Family Medicine

## 2019-06-26 ENCOUNTER — Telehealth (INDEPENDENT_AMBULATORY_CARE_PROVIDER_SITE_OTHER): Payer: No Typology Code available for payment source | Admitting: Family Medicine

## 2019-06-26 VITALS — Ht 70.25 in

## 2019-06-26 DIAGNOSIS — I10 Essential (primary) hypertension: Secondary | ICD-10-CM | POA: Diagnosis not present

## 2019-06-26 DIAGNOSIS — E119 Type 2 diabetes mellitus without complications: Secondary | ICD-10-CM | POA: Diagnosis not present

## 2019-06-26 DIAGNOSIS — F419 Anxiety disorder, unspecified: Secondary | ICD-10-CM

## 2019-06-26 DIAGNOSIS — E291 Testicular hypofunction: Secondary | ICD-10-CM | POA: Diagnosis not present

## 2019-06-26 DIAGNOSIS — E039 Hypothyroidism, unspecified: Secondary | ICD-10-CM | POA: Diagnosis not present

## 2019-06-26 HISTORY — DX: Testicular hypofunction: E29.1

## 2019-06-26 NOTE — Assessment & Plan Note (Signed)
-  Stable with current dose of levothyroxine.  Future labs ordered.

## 2019-06-26 NOTE — Assessment & Plan Note (Signed)
Improved with addition of bupropion.  Continue at current dose.

## 2019-06-26 NOTE — Assessment & Plan Note (Signed)
-  Due for updated labs, ordered as future orders and will get him scheduled for lab visit.  -He will continue current medications for now.  Recommend low carb diet with regular exercise to maintain blood sugars and healthy weight.

## 2019-06-26 NOTE — Progress Notes (Signed)
Mark Black - 53 y.o. male MRN [id number]  Date of birth: 10/26/1965   This visit type was conducted due to national recommendations for restrictions regarding the COVID-19 Pandemic (e.g. social distancing).  This format is felt to be most appropriate for this patient at this time.  All issues noted in this document were discussed and addressed.  No physical exam was performed (except for noted visual exam findings with Video Visits).  I discussed the limitations of evaluation and management by telemedicine and the availability of in person appointments. The patient expressed understanding and agreed to proceed.  I connected with@ on 06/26/19 at  3:40 PM EST by a video enabled telemedicine application and verified that I am speaking with the correct person using two identifiers.  Present at visit: Luetta Nutting, DO Dorice Lamas   Patient Location: [place] [employer] [id number]   Provider location:   [place]  Chief Complaint  Patient presents with  . Follow-up    follow up pt said anxiety been better.    HPI  Mark Black is a 53 y.o. male who presents via audio/video conferencing for a telehealth visit today.  He is following up today for anxiety, T2DM, HTN and hypothyroidism and hypogonadism.  He has been following with Dr. Dwyane Dee for his endocrinology issues but these have been stable and would like to have me follow these.  He is due for updated labs.    -T2DM: diabetes has been well controlled with combination of metfromin and farxiga.  Denies any symptoms including increased thirst and urination.  He stays fairly active.  He reports that weight has been stable.   -HTN:  Current medications include amlodipine, benazepril, bisoprolol and hctz.  He is compliant with medications.  He has not checked BP at home in a few months.  He denies symptoms including chest pain, headache, shortness of breath, or symptoms of hypotension.    -Hypothyroidism: Compliatn with levothyroxine.  Current dose at 179mcg.  He denies symptoms of hypo/hyper thyroidism at this time.   -Hypogonadism: Current treatment with clomid 50mg  daily.  He feels that energy levels are good.  He denies increased muscle wasting or libido changes.  He denies headache or vision changes.   -Anxiety:  Current management with pristiq and bupropion.  He reports that this combination is working well for him.  He denies side effects from medication at this time.    ROS:  A comprehensive ROS was completed and negative except as noted per HPI  Past Medical History:  Diagnosis Date  . Allergy   . Alopecia totalis   . Asthma    as a child  . Chicken pox   . Diabetes mellitus without complication ([place])   . Hypertension   . Thyroid disease   . Vitiligo     Past Surgical History:  Procedure Laterality Date  . CARPAL TUNNEL RELEASE     Bil  . NASAL SINUS SURGERY  1994   cut and open air passages/nostrils    Family History  Problem Relation Age of Onset  . Diabetes Father   . Hypertension Father   . Diabetes Paternal Aunt   . Cancer Maternal Aunt        breast  . Diabetes Sister   . Hypertension Sister   . Diabetes Brother   . Colon cancer Neg Hx   . Colon polyps Neg Hx   . Esophageal cancer Neg Hx   . Stomach cancer  Neg Hx   . Rectal cancer Neg Hx     Social History   Socioeconomic History  . Marital status: Married    Spouse name: Not on file  . Number of children: Not on file  . Years of education: Not on file  . Highest education level: Not on file  Occupational History  . Not on file  Tobacco Use  . Smoking status: Never Smoker  . Smokeless tobacco: Never Used  Substance and Sexual Activity  . Alcohol use: Not Currently  . Drug use: Not Currently  . Sexual activity: Not on file  Other Topics Concern  . Not on file  Social History Narrative  . Not on file   Social Determinants of Health   Financial Resource Strain:    . Difficulty of Paying Living Expenses: Not on file  Food Insecurity:   . Worried About Charity fundraiser in the Last Year: Not on file  . Ran Out of Food in the Last Year: Not on file  Transportation Needs:   . Lack of Transportation (Medical): Not on file  . Lack of Transportation (Non-Medical): Not on file  Physical Activity:   . Days of Exercise per Week: Not on file  . Minutes of Exercise per Session: Not on file  Stress:   . Feeling of Stress : Not on file  Social Connections:   . Frequency of Communication with Friends and Family: Not on file  . Frequency of Social Gatherings with Friends and Family: Not on file  . Attends Religious Services: Not on file  . Active Member of Clubs or Organizations: Not on file  . Attends Archivist Meetings: Not on file  . Marital Status: Not on file  Intimate Partner Violence:   . Fear of Current or Ex-Partner: Not on file  . Emotionally Abused: Not on file  . Physically Abused: Not on file  . Sexually Abused: Not on file     Current Outpatient Medications:  .  amLODipine-benazepril (LOTREL) 10-40 MG capsule, Take 1 tablet by mouth once daily. **Needs appt for further refills**, Disp: 90 capsule, Rfl: 0 .  bisoprolol-hydrochlorothiazide (ZIAC) 5-6.25 MG tablet, TAKE 1 TABLET BY MOUTH DAILY., Disp: 90 tablet, Rfl: 1 .  Blood Glucose Monitoring Suppl (FREESTYLE LITE) DEVI, Use as instructed to check blood sugar once a day at various times, Disp: 100 each, Rfl: 3 .  buPROPion (WELLBUTRIN XL) 150 MG 24 hr tablet, Take 1 tablet (150 mg total) by mouth daily., Disp: 90 tablet, Rfl: 1 .  clomiPHENE (CLOMID) 50 MG tablet, TAKE 1/2 TABLET BY MOUTH TWICE WEEKLY, Disp: 12 tablet, Rfl: 1 .  desvenlafaxine (PRISTIQ) 100 MG 24 hr tablet, TAKE 1 TABLET BY MOUTH DAILY., Disp: 90 tablet, Rfl: 3 .  FARXIGA 5 MG TABS tablet, TAKE 1 TABLET BY MOUTH DAILY (Patient taking differently: Take 10 mg by mouth once a week. ), Disp: 30 tablet, Rfl: 2 .   fluticasone (FLONASE) 50 MCG/ACT nasal spray, Place 2 sprays into both nostrils daily., Disp: 16 g, Rfl: 6 .  glucose blood test strip, Use as instructed, Disp: 100 each, Rfl: 12 .  Lancets (FREESTYLE) lancets, Use as instructed, Disp: 100 each, Rfl: 12 .  levothyroxine (SYNTHROID) 150 MCG tablet, TAKE 1 TABLET BY MOUTH DAILY., Disp: 90 tablet, Rfl: 0 .  loratadine (KLS ALLERCLEAR) 10 MG tablet, Take 10 mg by mouth daily., Disp: , Rfl:  .  metFORMIN (GLUCOPHAGE-XR) 500 MG 24 hr tablet, TAKE 3  TABLETS (1,500 MG TOTAL) BY MOUTH DAILY WITH SUPPER., Disp: 270 tablet, Rfl: 0 .  methocarbamol (ROBAXIN) 750 MG tablet, Take 1 tablet (750 mg total) by mouth every 8 (eight) hours as needed for muscle spasms., Disp: 45 tablet, Rfl: 1 .  Multiple Vitamins-Minerals (MULTIVITAMIN ADULT PO), Take by mouth., Disp: , Rfl:  .  ondansetron (ZOFRAN-ODT) 8 MG disintegrating tablet, Take 1 tablet (8 mg total) by mouth every 8 (eight) hours as needed for nausea., Disp: 20 tablet, Rfl: 0 .  pantoprazole (PROTONIX) 40 MG tablet, Take 1 tablet (40 mg total) by mouth daily., Disp: 90 tablet, Rfl: 1 .  sodium chloride (OCEAN) 0.65 % SOLN nasal spray, Place 1 spray into both nostrils as needed., Disp: 60 mL, Rfl: 0 .  tamsulosin (FLOMAX) 0.4 MG CAPS capsule, Take 1 tablet by mouth once daily. **Needs appt for further refills.**, Disp: 90 capsule, Rfl: 0 .  triamcinolone cream (KENALOG) 0.1 %, Apply 1 application topically 2 (two) times daily., Disp: 30 g, Rfl: 0 .  vitamin B-12 (CYANOCOBALAMIN) 1000 MCG tablet, Take 1,000 mcg by mouth daily., Disp: , Rfl:   Current Facility-Administered Medications:  .  0.9 %  sodium chloride infusion, 500 mL, Intravenous, Once, Armbruster, Carlota Raspberry, MD .  ondansetron (ZOFRAN-ODT) disintegrating tablet 8 mg, 8 mg, Oral, Once, Wiseman, [place] D, PA-C  EXAM:  VITALS per patient if applicable: Ht 5' [id number]" (1.784 m)   BMI 32.05 kg/m   GENERAL: alert, oriented, appears well and in no  acute distress  HEENT: atraumatic, conjunttiva clear, no obvious abnormalities on inspection of external nose and ears  NECK: normal movements of the head and neck  LUNGS: on inspection no signs of respiratory distress, breathing rate appears normal, no obvious gross SOB, gasping or wheezing  CV: no obvious cyanosis  MS: moves all visible extremities without noticeable abnormality  PSYCH/NEURO: pleasant and cooperative, no obvious depression or anxiety, speech and thought processing grossly intact  ASSESSMENT AND PLAN:  Discussed the following assessment and plan:  T2DM (type 2 diabetes mellitus) ([place]) -Due for updated labs, ordered as future orders and will get him scheduled for lab visit.  -He will continue current medications for now.  Recommend low carb diet with regular exercise to maintain blood sugars and healthy weight.   Hypothyroidism -Stable with current dose of levothyroxine.  Future labs ordered.   Hypogonadism in male -Denies symptoms at this time.  Doing well with current dose of clomid.   Essential hypertension -He will start checking BP at home -Continue current medication and doses -Instructed on low salt diet and maintaining healthy weight.   Anxiety Improved with addition of bupropion.  Continue at current dose.     I discussed the assessment and treatment plan with the patient. The patient was provided an opportunity to ask questions and all were answered. The patient agreed with the plan and demonstrated an understanding of the instructions.   The patient was advised to call back or seek an in-person evaluation if the symptoms worsen or if the condition fails to improve as anticipated.    Luetta Nutting, DO

## 2019-06-26 NOTE — Assessment & Plan Note (Signed)
-  He will start checking BP at home -Continue current medication and doses -Instructed on low salt diet and maintaining healthy weight.

## 2019-06-26 NOTE — Assessment & Plan Note (Signed)
-  Denies symptoms at this time.  Doing well with current dose of clomid.

## 2019-07-11 ENCOUNTER — Other Ambulatory Visit (INDEPENDENT_AMBULATORY_CARE_PROVIDER_SITE_OTHER): Payer: No Typology Code available for payment source

## 2019-07-11 ENCOUNTER — Other Ambulatory Visit: Payer: Self-pay

## 2019-07-11 DIAGNOSIS — E039 Hypothyroidism, unspecified: Secondary | ICD-10-CM | POA: Diagnosis not present

## 2019-07-11 DIAGNOSIS — E119 Type 2 diabetes mellitus without complications: Secondary | ICD-10-CM

## 2019-07-11 DIAGNOSIS — E291 Testicular hypofunction: Secondary | ICD-10-CM

## 2019-07-12 ENCOUNTER — Encounter: Payer: Self-pay | Admitting: Family Medicine

## 2019-07-12 LAB — COMPREHENSIVE METABOLIC PANEL
ALT: 34 U/L (ref 0–53)
AST: 34 U/L (ref 0–37)
Albumin: 4.4 g/dL (ref 3.5–5.2)
Alkaline Phosphatase: 59 U/L (ref 39–117)
BUN: 13 mg/dL (ref 6–23)
CO2: 26 mEq/L (ref 19–32)
Calcium: 9.1 mg/dL (ref 8.4–10.5)
Chloride: 100 mEq/L (ref 96–112)
Creatinine, Ser: 0.89 mg/dL (ref 0.40–1.50)
GFR: 88.58 mL/min (ref >60.00)
Glucose, Bld: 219 mg/dL — ABNORMAL HIGH (ref 70–99)
Potassium: 3.6 mEq/L (ref 3.5–5.1)
Sodium: 135 mEq/L (ref 135–145)
Total Bilirubin: 0.5 mg/dL (ref 0.2–1.2)
Total Protein: 6.3 g/dL (ref 6.0–8.3)

## 2019-07-12 LAB — CBC
HCT: 45.8 % (ref 39.0–52.0)
Hemoglobin: 16 g/dL (ref 13.0–17.0)
MCHC: 34.9 g/dL (ref 30.0–36.0)
MCV: 90.2 fl (ref 78.0–100.0)
Platelets: 189 10*3/uL (ref 150.0–400.0)
RBC: 5.08 Mil/uL (ref 4.22–5.81)
RDW: 13 % (ref 11.5–15.5)
WBC: 8.4 10*3/uL (ref 4.0–10.5)

## 2019-07-12 LAB — HEMOGLOBIN A1C: Hgb A1c MFr Bld: 5.8 % (ref 4.6–6.5)

## 2019-07-12 LAB — LUTEINIZING HORMONE: LH: 11.55 m[IU]/mL — ABNORMAL HIGH (ref 1.50–9.30)

## 2019-07-12 LAB — TSH: TSH: 2.59 u[IU]/mL (ref 0.35–4.50)

## 2019-07-12 LAB — TESTOSTERONE: Testosterone: 519.26 ng/dL (ref 300.00–890.00)

## 2019-07-17 ENCOUNTER — Ambulatory Visit (INDEPENDENT_AMBULATORY_CARE_PROVIDER_SITE_OTHER): Payer: No Typology Code available for payment source | Admitting: Family Medicine

## 2019-07-17 ENCOUNTER — Encounter: Payer: Self-pay | Admitting: Family Medicine

## 2019-07-17 ENCOUNTER — Other Ambulatory Visit: Payer: Self-pay

## 2019-07-17 DIAGNOSIS — J0100 Acute maxillary sinusitis, unspecified: Secondary | ICD-10-CM | POA: Insufficient documentation

## 2019-07-17 MED ORDER — AMOXICILLIN-POT CLAVULANATE 875-125 MG PO TABS: 1.0000 | ORAL_TABLET | Freq: Two times a day (BID) | ORAL | 0 refills | Status: DC

## 2019-07-17 MED FILL — AMOX-CLAV 875-125 MG TABLET: 875-125 | 10 days supply | Qty: 20 | Fill #0

## 2019-07-17 NOTE — Progress Notes (Signed)
Mark Black - 53 y.o. male MRN [id number]  Date of birth: Dec 11, 1965   This visit type was conducted due to national recommendations for restrictions regarding the COVID-19 Pandemic (e.g. social distancing).  This format is felt to be most appropriate for this patient at this time.  All issues noted in this document were discussed and addressed.  No physical exam was performed (except for noted visual exam findings with Video Visits).  I discussed the limitations of evaluation and management by telemedicine and the availability of in person appointments. The patient expressed understanding and agreed to proceed.  I connected with@ on 07/17/19 at  3:40 PM EST by a video enabled telemedicine application and verified that I am speaking with the correct person using two identifiers.  Interactive audio and video telecommunications were attempted between this provider and patient, however failed, due to patient having technical difficulties OR patient did not have access to video capability.  We continued and completed visit with audio only.    Present at visit: Luetta Nutting, DO Dorice Lamas   Patient Location: [place] [id number]   Provider location:   Home office.   Chief Complaint  Patient presents with  . Sinusitis    pt been having symtoms for 2wks congestion, runny nose, headaches, mild coughing w/ drainage & fatigue, nausea  no fever.    HPI  Mark Black is a 54 y.o. male who presents via audio/video conferencing for a telehealth visit today.  He reports symptoms of sinus infection including nasal congestion, sinus pain, headache, mild fatigue and mild cough.  Symptoms started about two weeks ago.  He has had improvement with OTC medications but symptoms return after he stops taking medication. He denies fever, chills, chest tightness, shortness of breath, wheezing, changes to taste or smell or body aches.    ROS:  A comprehensive ROS  was completed and negative except as noted per HPI  Past Medical History:  Diagnosis Date  . Allergy   . Alopecia totalis   . Asthma    as a child  . Chicken pox   . Diabetes mellitus without complication ([place])   . Hypertension   . Thyroid disease   . Vitiligo     Past Surgical History:  Procedure Laterality Date  . CARPAL TUNNEL RELEASE     Bil  . NASAL SINUS SURGERY  1994   cut and open air passages/nostrils    Family History  Problem Relation Age of Onset  . Diabetes Father   . Hypertension Father   . Diabetes Paternal Aunt   . Cancer Maternal Aunt        breast  . Diabetes Sister   . Hypertension Sister   . Diabetes Brother   . Colon cancer Neg Hx   . Colon polyps Neg Hx   . Esophageal cancer Neg Hx   . Stomach cancer Neg Hx   . Rectal cancer Neg Hx     Social History   Socioeconomic History  . Marital status: Married    Spouse name: Not on file  . Number of children: Not on file  . Years of education: Not on file  . Highest education level: Not on file  Occupational History  . Not on file  Tobacco Use  . Smoking status: Never Smoker  . Smokeless tobacco: Never Used  Substance and Sexual Activity  . Alcohol use: Not Currently  . Drug use: Not Currently  . Sexual activity:  Not on file  Other Topics Concern  . Not on file  Social History Narrative  . Not on file   Social Determinants of Health   Financial Resource Strain:   . Difficulty of Paying Living Expenses: Not on file  Food Insecurity:   . Worried About Charity fundraiser in the Last Year: Not on file  . Ran Out of Food in the Last Year: Not on file  Transportation Needs:   . Lack of Transportation (Medical): Not on file  . Lack of Transportation (Non-Medical): Not on file  Physical Activity:   . Days of Exercise per Week: Not on file  . Minutes of Exercise per Session: Not on file  Stress:   . Feeling of Stress : Not on file  Social Connections:   . Frequency of Communication  with Friends and Family: Not on file  . Frequency of Social Gatherings with Friends and Family: Not on file  . Attends Religious Services: Not on file  . Active Member of Clubs or Organizations: Not on file  . Attends Archivist Meetings: Not on file  . Marital Status: Not on file  Intimate Partner Violence:   . Fear of Current or Ex-Partner: Not on file  . Emotionally Abused: Not on file  . Physically Abused: Not on file  . Sexually Abused: Not on file     Current Outpatient Medications:  .  amLODipine-benazepril (LOTREL) 10-40 MG capsule, Take 1 tablet by mouth once daily. **Needs appt for further refills**, Disp: 90 capsule, Rfl: 0 .  bisoprolol-hydrochlorothiazide (ZIAC) 5-6.25 MG tablet, TAKE 1 TABLET BY MOUTH DAILY., Disp: 90 tablet, Rfl: 1 .  Blood Glucose Monitoring Suppl (FREESTYLE LITE) DEVI, Use as instructed to check blood sugar once a day at various times, Disp: 100 each, Rfl: 3 .  buPROPion (WELLBUTRIN XL) 150 MG 24 hr tablet, Take 1 tablet (150 mg total) by mouth daily., Disp: 90 tablet, Rfl: 1 .  clomiPHENE (CLOMID) 50 MG tablet, TAKE 1/2 TABLET BY MOUTH TWICE WEEKLY, Disp: 12 tablet, Rfl: 1 .  desvenlafaxine (PRISTIQ) 100 MG 24 hr tablet, TAKE 1 TABLET BY MOUTH DAILY., Disp: 90 tablet, Rfl: 3 .  FARXIGA 5 MG TABS tablet, TAKE 1 TABLET BY MOUTH DAILY (Patient taking differently: Take 10 mg by mouth once a week. ), Disp: 30 tablet, Rfl: 2 .  fluticasone (FLONASE) 50 MCG/ACT nasal spray, Place 2 sprays into both nostrils daily., Disp: 16 g, Rfl: 6 .  glucose blood test strip, Use as instructed, Disp: 100 each, Rfl: 12 .  Lancets (FREESTYLE) lancets, Use as instructed, Disp: 100 each, Rfl: 12 .  levothyroxine (SYNTHROID) 150 MCG tablet, TAKE 1 TABLET BY MOUTH DAILY., Disp: 90 tablet, Rfl: 0 .  loratadine (KLS ALLERCLEAR) 10 MG tablet, Take 10 mg by mouth daily., Disp: , Rfl:  .  metFORMIN (GLUCOPHAGE-XR) 500 MG 24 hr tablet, TAKE 3 TABLETS (1,500 MG TOTAL) BY MOUTH  DAILY WITH SUPPER., Disp: 270 tablet, Rfl: 0 .  methocarbamol (ROBAXIN) 750 MG tablet, Take 1 tablet (750 mg total) by mouth every 8 (eight) hours as needed for muscle spasms., Disp: 45 tablet, Rfl: 1 .  Multiple Vitamins-Minerals (MULTIVITAMIN ADULT PO), Take by mouth., Disp: , Rfl:  .  ondansetron (ZOFRAN-ODT) 8 MG disintegrating tablet, Take 1 tablet (8 mg total) by mouth every 8 (eight) hours as needed for nausea., Disp: 20 tablet, Rfl: 0 .  pantoprazole (PROTONIX) 40 MG tablet, Take 1 tablet (40 mg total) by  mouth daily., Disp: 90 tablet, Rfl: 1 .  sodium chloride (OCEAN) 0.65 % SOLN nasal spray, Place 1 spray into both nostrils as needed., Disp: 60 mL, Rfl: 0 .  tamsulosin (FLOMAX) 0.4 MG CAPS capsule, Take 1 tablet by mouth once daily. **Needs appt for further refills.**, Disp: 90 capsule, Rfl: 0 .  triamcinolone cream (KENALOG) 0.1 %, Apply 1 application topically 2 (two) times daily., Disp: 30 g, Rfl: 0 .  vitamin B-12 (CYANOCOBALAMIN) 1000 MCG tablet, Take 1,000 mcg by mouth daily., Disp: , Rfl:  .  amoxicillin-clavulanate (AUGMENTIN) 875-125 MG tablet, Take 1 tablet by mouth 2 (two) times daily., Disp: 20 tablet, Rfl: 0  Current Facility-Administered Medications:  .  0.9 %  sodium chloride infusion, 500 mL, Intravenous, Once, Armbruster, Carlota Raspberry, MD .  ondansetron (ZOFRAN-ODT) disintegrating tablet 8 mg, 8 mg, Oral, Once, Wiseman, [place] D, PA-C  EXAM:  VITALS per patient if applicable: BP (!) [id number] Comment: per pt.  Ht 5' 10.25" (1.784 m)   BMI 32.05 kg/m   GENERAL: alert, oriented, NAD    PSYCH/NEURO: pleasant and cooperative, no obvious depression or anxiety, speech and thought processing grossly intact  ASSESSMENT AND PLAN:  Discussed the following assessment and plan:  Acute maxillary sinusitis Will cover for bacterial sinusitis given prolonged symptoms of >2 weeks.   Rx for augmentin sent in.  He may continue additional supportive treatment at home.    Instructed to push fluids and rest.  Discussed to call back if not improving over the next few days or if he has worsening of symptoms.      I discussed the assessment and treatment plan with the patient. The patient was provided an opportunity to ask questions and all were answered. The patient agreed with the plan and demonstrated an understanding of the instructions.   The patient was advised to call back or seek an in-person evaluation if the symptoms worsen or if the condition fails to improve as anticipated.    Luetta Nutting, DO

## 2019-07-17 NOTE — Assessment & Plan Note (Signed)
Will cover for bacterial sinusitis given prolonged symptoms of >2 weeks.   Rx for augmentin sent in.  He may continue additional supportive treatment at home.  Instructed to push fluids and rest.  Discussed to call back if not improving over the next few days or if he has worsening of symptoms.

## 2019-07-18 ENCOUNTER — Ambulatory Visit: Payer: Self-pay | Admitting: Family Medicine

## 2019-07-22 ENCOUNTER — Encounter: Payer: Self-pay | Admitting: Family Medicine

## 2019-07-25 ENCOUNTER — Other Ambulatory Visit: Payer: Self-pay

## 2019-07-25 ENCOUNTER — Other Ambulatory Visit: Payer: Self-pay | Admitting: Endocrinology

## 2019-07-25 ENCOUNTER — Encounter: Payer: Self-pay | Admitting: Family Medicine

## 2019-07-25 MED ORDER — BISOPROLOL-HYDROCHLOROTHIAZIDE 5-6.25 MG PO TABS: 1.0000 | ORAL_TABLET | Freq: Every day | ORAL | 1 refills | Status: DC

## 2019-07-25 MED FILL — BISOPROLOL-HCTZ 5-6.25 MG T: 5-6.25 | 90 days supply | Qty: 90 | Fill #0

## 2019-08-08 ENCOUNTER — Encounter: Payer: Self-pay | Admitting: Family Medicine

## 2019-08-09 ENCOUNTER — Other Ambulatory Visit: Payer: Self-pay | Admitting: Endocrinology

## 2019-08-09 ENCOUNTER — Other Ambulatory Visit: Payer: Self-pay | Admitting: Family Medicine

## 2019-08-09 MED ORDER — FARXIGA 5 MG PO TABS: 5.0000 mg | ORAL_TABLET | Freq: Every day | ORAL | 2 refills | Status: DC

## 2019-08-09 MED ORDER — BUPROPION HCL ER (XL) 150 MG PO TB24: 150.0000 mg | ORAL_TABLET | Freq: Every day | ORAL | 1 refills | Status: DC

## 2019-08-09 MED ORDER — METFORMIN HCL ER 500 MG PO TB24: 1500.0000 mg | ORAL_TABLET | Freq: Every day | ORAL | 0 refills | Status: DC

## 2019-08-09 MED FILL — buPROPion HCL ER (XL) 150 M: 150 | 90 days supply | Qty: 90 | Fill #0

## 2019-08-09 MED FILL — FARXIGA 5 MG TABLET: 5 | 30 days supply | Qty: 30 | Fill #0

## 2019-08-09 MED FILL — METFORMIN HCL ER 500 MG TB2: 500 | 90 days supply | Qty: 270 | Fill #0

## 2019-08-09 MED FILL — CLOMIPHENE CITRATE 50 MG TA: 50 | 84 days supply | Qty: 12 | Fill #1

## 2019-08-29 ENCOUNTER — Encounter: Payer: Self-pay | Admitting: Family Medicine

## 2019-08-29 MED ORDER — AMLODIPINE BESY-BENAZEPRIL HCL 10-40 MG PO CAPS: ORAL_CAPSULE | ORAL | 1 refills | Status: DC

## 2019-08-29 MED FILL — AMLODIPINE-BENAZ 10-40 MG: 10-40 | 90 days supply | Qty: 90 | Fill #0

## 2019-08-31 ENCOUNTER — Encounter: Payer: Self-pay | Admitting: Family Medicine

## 2019-09-01 ENCOUNTER — Other Ambulatory Visit: Payer: Self-pay | Admitting: Family Medicine

## 2019-09-01 MED ORDER — TAMSULOSIN HCL 0.4 MG PO CAPS: ORAL_CAPSULE | ORAL | 0 refills | Status: DC

## 2019-09-01 MED FILL — TAMSULOSIN HCL 0.4 MG CAP: 0.4 | 30 days supply | Qty: 30 | Fill #0

## 2019-09-04 MED FILL — PANTOPRAZOLE SOD DR 40 MG T: 40 | 90 days supply | Qty: 90 | Fill #0

## 2019-09-06 MED FILL — LEVOTHYROXINE SODIUM 150 MC: 150 | 90 days supply | Qty: 90 | Fill #0

## 2019-09-11 MED FILL — DESVENLAFAXINE SUC ER 100 M: 100 | 90 days supply | Qty: 90 | Fill #3

## 2019-10-05 ENCOUNTER — Ambulatory Visit: Payer: No Typology Code available for payment source | Attending: Internal Medicine

## 2019-10-05 DIAGNOSIS — Z23 Encounter for immunization: Secondary | ICD-10-CM

## 2019-10-05 NOTE — Progress Notes (Signed)
   Covid-19 Vaccination Clinic  Name:  Johnthan Melgarejo    MRN: [id number] DOB: 02/18/1966  10/05/2019  Mr. Ikerd was observed post Covid-19 immunization for 15 minutes without incident. He was provided with Vaccine Information Sheet and instruction to access the V-Safe system.   Mr. Croney was instructed to call 911 with any severe reactions post vaccine: Marland Kitchen Difficulty breathing  . Swelling of face and throat  . A fast heartbeat  . A bad rash all over body  . Dizziness and weakness   Immunizations Administered    Name Date Dose VIS Date Route   Pfizer COVID-19 Vaccine 10/05/2019  3:14 PM 0.3 mL 06/23/2019 Intramuscular   Manufacturer: [place]   Lot: [id number]   [place]: [id number]

## 2019-10-06 ENCOUNTER — Other Ambulatory Visit: Payer: Self-pay | Admitting: Family Medicine

## 2019-10-09 ENCOUNTER — Encounter: Payer: Self-pay | Admitting: Family Medicine

## 2019-10-09 ENCOUNTER — Ambulatory Visit (INDEPENDENT_AMBULATORY_CARE_PROVIDER_SITE_OTHER): Payer: No Typology Code available for payment source | Admitting: Family Medicine

## 2019-10-09 ENCOUNTER — Other Ambulatory Visit: Payer: Self-pay | Admitting: Family Medicine

## 2019-10-09 ENCOUNTER — Other Ambulatory Visit: Payer: Self-pay

## 2019-10-09 VITALS — BP 130/84 | HR 77 | Temp 98.1°F | Ht 70.25 in | Wt 236.0 lb

## 2019-10-09 DIAGNOSIS — I10 Essential (primary) hypertension: Secondary | ICD-10-CM

## 2019-10-09 DIAGNOSIS — F419 Anxiety disorder, unspecified: Secondary | ICD-10-CM

## 2019-10-09 DIAGNOSIS — E119 Type 2 diabetes mellitus without complications: Secondary | ICD-10-CM | POA: Diagnosis not present

## 2019-10-09 DIAGNOSIS — E039 Hypothyroidism, unspecified: Secondary | ICD-10-CM | POA: Diagnosis not present

## 2019-10-09 DIAGNOSIS — E291 Testicular hypofunction: Secondary | ICD-10-CM | POA: Diagnosis not present

## 2019-10-09 DIAGNOSIS — R399 Unspecified symptoms and signs involving the genitourinary system: Secondary | ICD-10-CM

## 2019-10-09 LAB — POCT GLYCOSYLATED HEMOGLOBIN (HGB A1C): Hemoglobin A1C: 5.5 % (ref 4.0–5.6)

## 2019-10-09 MED ORDER — METFORMIN HCL ER 500 MG PO TB24: 1500.0000 mg | ORAL_TABLET | Freq: Every day | ORAL | 0 refills | Status: DC

## 2019-10-09 MED ORDER — TAMSULOSIN HCL 0.4 MG PO CAPS: ORAL_CAPSULE | ORAL | 2 refills | Status: DC

## 2019-10-09 MED ORDER — FARXIGA 5 MG PO TABS: 5.0000 mg | ORAL_TABLET | Freq: Every day | ORAL | 2 refills | Status: DC

## 2019-10-09 MED ORDER — DESVENLAFAXINE SUCCINATE ER 100 MG PO TB24: ORAL_TABLET | ORAL | 3 refills | Status: DC

## 2019-10-09 MED ORDER — LEVOTHYROXINE SODIUM 150 MCG PO TABS: 150.0000 ug | ORAL_TABLET | Freq: Every day | ORAL | 2 refills | Status: DC

## 2019-10-09 MED FILL — TAMSULOSIN HCL 0.4 MG CAP: 0.4 | 90 days supply | Qty: 90 | Fill #0

## 2019-10-09 MED FILL — FARXIGA 5 MG TABLET: 5 | 90 days supply | Qty: 90 | Fill #0

## 2019-10-09 NOTE — Assessment & Plan Note (Signed)
Most recent A1c of  Lab Results  Component Value Date   HGBA1C 5.5 10/09/2019   indicates diabetes is well controlled.  he  will continue metformin and farxiga.  Counseled on healthy, low carb diet and recommend frequent activity to help with maintaining good control of blood sugars.

## 2019-10-09 NOTE — Assessment & Plan Note (Signed)
Well controlled with prisitq, continue at current strength and dosing.

## 2019-10-09 NOTE — Progress Notes (Signed)
Mark Black - 54 y.o. male MRN [id number]  Date of birth: 1966-03-23  Subjective No chief complaint on file.   HPI Mark Black is a 54 y.o. male with history of HTN, T2DM, hypothyrodism, low testosterone, and anxiety here today for follow up  -HTN:  Current management with amlodipine, benazepril, bisoprolol and hctz.  He is doing well with current medications.  He denies symptoms of hypotension.  He is working on weight loss.  He has not had chest pain, shortness of breath, palpitations, headache or vision changes.   -T2DM:  He was previously seen by endocrinology but has requested that I take over medications due to specialist co-pay.  Diabetes is currently managed with metformin and farxiga.  He does not check blood sugars at home.  He denies symptoms of hypoglycemia.  His job keeps him pretty active.    -Hypothyroidism:  Current dose of levothyroxine is 142mcg, taking daily as directed.  Denies symptoms of hypo/hyper-thyroidism.    -Anxiety;  Well controlled with pristiq.  He denies side effects with medication.   -Low testosterone:  Taking clomid 2-3x per week.  He is doing well with this.  ROS:  A comprehensive ROS was completed and negative except as noted per HPI  No Known Allergies  Past Medical History:  Diagnosis Date  . Allergy   . Alopecia totalis   . Asthma    as a child  . Chicken pox   . Diabetes mellitus without complication ([place])   . Hypertension   . Thyroid disease   . Vitiligo     Past Surgical History:  Procedure Laterality Date  . CARPAL TUNNEL RELEASE     Bil  . NASAL SINUS SURGERY  1994   cut and open air passages/nostrils    Social History   Socioeconomic History  . Marital status: Married    Spouse name: Not on file  . Number of children: Not on file  . Years of education: Not on file  . Highest education level: Not on file  Occupational History  . Not on file  Tobacco Use  . Smoking status: Never Smoker  . Smokeless  tobacco: Never Used  Substance and Sexual Activity  . Alcohol use: Not Currently  . Drug use: Not Currently  . Sexual activity: Not on file  Other Topics Concern  . Not on file  Social History Narrative  . Not on file   Social Determinants of Health   Financial Resource Strain:   . Difficulty of Paying Living Expenses:   Food Insecurity:   . Worried About Charity fundraiser in the Last Year:   . Arboriculturist in the Last Year:   Transportation Needs:   . Film/video editor (Medical):   Marland Kitchen Lack of Transportation (Non-Medical):   Physical Activity:   . Days of Exercise per Week:   . Minutes of Exercise per Session:   Stress:   . Feeling of Stress :   Social Connections:   . Frequency of Communication with Friends and Family:   . Frequency of Social Gatherings with Friends and Family:   . Attends Religious Services:   . Active Member of Clubs or Organizations:   . Attends Archivist Meetings:   Marland Kitchen Marital Status:     Family History  Problem Relation Age of Onset  . Diabetes Father   . Hypertension Father   . Diabetes Paternal Aunt   . Cancer Maternal Aunt  breast  . Diabetes Sister   . Hypertension Sister   . Diabetes Brother   . Colon cancer Neg Hx   . Colon polyps Neg Hx   . Esophageal cancer Neg Hx   . Stomach cancer Neg Hx   . Rectal cancer Neg Hx     Health Maintenance  Topic Date Due  . Hepatitis C Screening  Never done  . OPHTHALMOLOGY EXAM  Never done  . HIV Screening  Never done  . FOOT EXAM  08/21/2016  . HEMOGLOBIN A1C  04/10/2020  . TETANUS/TDAP  01/27/2027  . COLONOSCOPY  05/27/2028  . INFLUENZA VACCINE  Completed  . PNEUMOCOCCAL POLYSACCHARIDE VACCINE AGE 31-64 HIGH RISK  Completed     ----------------------------------------------------------------------------------------------------------------------------------------------------------------------------------------------------------------- Physical Exam BP 130/84    Pulse 77   Temp 98.1 F (36.7 C) (Oral)   Ht 5' 10.25" (1.784 m)   Wt 236 lb (107 kg)   BMI 33.62 kg/m   Physical Exam Constitutional:      Appearance: Normal appearance.  HENT:     Head: Normocephalic and atraumatic.  Eyes:     General: No scleral icterus. Cardiovascular:     Rate and Rhythm: Normal rate and regular rhythm.  Pulmonary:     Effort: Pulmonary effort is normal.     Breath sounds: Normal breath sounds.  Musculoskeletal:     Cervical back: Neck supple.  Skin:    General: Skin is warm and dry.  Neurological:     General: No focal deficit present.     Mental Status: He is alert.  Psychiatric:        Mood and Affect: Mood normal.        Behavior: Behavior normal.     ------------------------------------------------------------------------------------------------------------------------------------------------------------------------------------------------------------------- Assessment and Plan  Essential hypertension Blood pressure is at goal at for age and co-morbidities.  I recommend that he continue current medications.  In addition they were instructed to follow a low sodium diet with regular exercise to help to maintain adequate control of blood pressure.    Hypothyroidism Lab Results  Component Value Date   TSH 2.59 07/11/2019  TSH has been stable, doing well at current dose of levothyroxine.  Continue and recheck TSH at 6 month f/u   T2DM (type 2 diabetes mellitus) ([place]) Most recent A1c of  Lab Results  Component Value Date   HGBA1C 5.5 10/09/2019   indicates diabetes is well controlled.  he  will continue metformin and farxiga.  Counseled on healthy, low carb diet and recommend frequent activity to help with maintaining good control of blood sugars.    Hypogonadism in male Doing well with clomid, no side effects at this time.   Lower urinary tract symptoms (LUTS) Stable with flomax, continue.   Anxiety Well controlled with prisitq,  continue at current strength and dosing.    Meds ordered this encounter  Medications  . dapagliflozin propanediol (FARXIGA) 5 MG TABS tablet    Sig: Take 5 mg by mouth daily.    Dispense:  90 tablet    Refill:  2  . desvenlafaxine (PRISTIQ) 100 MG 24 hr tablet    Sig: TAKE 1 TABLET BY MOUTH DAILY.    Dispense:  90 tablet    Refill:  3  . levothyroxine (SYNTHROID) 150 MCG tablet    Sig: Take 1 tablet (150 mcg total) by mouth daily.    Dispense:  90 tablet    Refill:  2  . metFORMIN (GLUCOPHAGE-XR) 500 MG 24 hr tablet  Sig: Take 3 tablets (1,500 mg total) by mouth daily with supper.    Dispense:  270 tablet    Refill:  0  . tamsulosin (FLOMAX) 0.4 MG CAPS capsule    Sig: Take 1 tablet by mouth once daily. **Needs appt for further refills.**    Dispense:  90 capsule    Refill:  2    Return in about 6 months (around 04/10/2020) for DM/HTN.    This visit occurred during the SARS-CoV-2 public health emergency.  Safety protocols were in place, including screening questions prior to the visit, additional usage of staff PPE, and extensive cleaning of exam room while observing appropriate contact time as indicated for disinfecting solutions.

## 2019-10-09 NOTE — Assessment & Plan Note (Signed)
Lab Results  Component Value Date   TSH 2.59 07/11/2019  TSH has been stable, doing well at current dose of levothyroxine.  Continue and recheck TSH at 6 month f/u

## 2019-10-09 NOTE — Assessment & Plan Note (Signed)
Doing well with clomid, no side effects at this time.

## 2019-10-09 NOTE — Assessment & Plan Note (Signed)
Stable with flomax, continue.

## 2019-10-09 NOTE — Assessment & Plan Note (Signed)
Blood pressure is at goal at for age and co-morbidities.  I recommend that he continue current medications.  In addition they were instructed to follow a low sodium diet with regular exercise to help to maintain adequate control of blood pressure.   

## 2019-10-22 ENCOUNTER — Encounter: Payer: Self-pay | Admitting: Family Medicine

## 2019-10-23 ENCOUNTER — Telehealth: Payer: Self-pay

## 2019-10-23 ENCOUNTER — Encounter: Payer: Self-pay | Admitting: Family Medicine

## 2019-10-23 ENCOUNTER — Telehealth: Payer: No Typology Code available for payment source | Admitting: Nurse Practitioner

## 2019-10-23 NOTE — Telephone Encounter (Signed)
FYI - Pt sent a MyChart msg at 1250 pm stating he is unable to keep his appointment. Pt has jury duty. Message sent to pt to call the clinic to reschedule his appointment.

## 2019-10-23 NOTE — Telephone Encounter (Signed)
Patient scheduled for a virtual visit.

## 2019-10-30 ENCOUNTER — Ambulatory Visit: Payer: No Typology Code available for payment source | Attending: Internal Medicine

## 2019-10-30 DIAGNOSIS — Z23 Encounter for immunization: Secondary | ICD-10-CM

## 2019-10-30 NOTE — Progress Notes (Signed)
   Covid-19 Vaccination Clinic  Name:  Livan Kubacki    MRN: [id number] DOB: October 19, 1965  10/30/2019  Mr. Call was observed post Covid-19 immunization for 15 minutes without incident. He was provided with Vaccine Information Sheet and instruction to access the V-Safe system.   Mr. Mikulich was instructed to call 911 with any severe reactions post vaccine: Marland Kitchen Difficulty breathing  . Swelling of face and throat  . A fast heartbeat  . A bad rash all over body  . Dizziness and weakness   Immunizations Administered    Name Date Dose VIS Date Route   Pfizer COVID-19 Vaccine 10/30/2019  3:27 PM 0.3 mL 09/06/2018 Intramuscular   Manufacturer: [employer]   Lot: [id number]   [place]: [id number]

## 2019-11-13 MED FILL — BISOPROLOL-HCTZ 5-6.25 MG T: 5-6.25 | 90 days supply | Qty: 90 | Fill #1

## 2019-11-27 MED FILL — AMLODIPINE-BENAZ 10-40 MG: 10-40 | 90 days supply | Qty: 90 | Fill #1

## 2019-11-29 MED FILL — LEVOTHYROXINE SODIUM 150 MC: 150 | 90 days supply | Qty: 90 | Fill #0

## 2019-12-13 ENCOUNTER — Other Ambulatory Visit: Payer: Self-pay | Admitting: Family Medicine

## 2019-12-13 MED FILL — DESVENLAFAXINE SUC ER 100 M: 100 | 90 days supply | Qty: 90 | Fill #0

## 2019-12-13 MED FILL — PANTOPRAZOLE SOD DR 40 MG T: 40 | 90 days supply | Qty: 90 | Fill #0

## 2019-12-13 NOTE — Telephone Encounter (Signed)
CM-Plz see refill req/thx dmf 

## 2020-01-31 ENCOUNTER — Other Ambulatory Visit: Payer: Self-pay | Admitting: Family Medicine

## 2020-01-31 MED FILL — TAMSULOSIN HCL 0.4 MG CAP: 0.4 | 90 days supply | Qty: 90 | Fill #1

## 2020-01-31 MED FILL — METFORMIN HCL ER 500 MG TB2: 500 | 60 days supply | Qty: 180 | Fill #0

## 2020-02-20 ENCOUNTER — Other Ambulatory Visit: Payer: Self-pay | Admitting: Family Medicine

## 2020-02-20 MED FILL — LEVOTHYROXINE SODIUM 150 MC: 150 | 90 days supply | Qty: 90 | Fill #1

## 2020-02-20 MED FILL — BISOPROLOL-HCTZ 5-6.25 MG T: 5-6.25 | 90 days supply | Qty: 90 | Fill #0

## 2020-02-28 ENCOUNTER — Other Ambulatory Visit: Payer: Self-pay | Admitting: Family Medicine

## 2020-02-28 MED FILL — AMLODIPINE-BENAZ 10-40 MG: 10-40 | 90 days supply | Qty: 90 | Fill #0

## 2020-02-28 MED FILL — buPROPion HCL ER (XL) 150 M: 150 | 90 days supply | Qty: 90 | Fill #0

## 2020-03-08 MED FILL — DESVENLAFAXINE SUC ER 100 M: 100 | 90 days supply | Qty: 90 | Fill #1

## 2020-03-29 ENCOUNTER — Other Ambulatory Visit: Payer: Self-pay | Admitting: Family Medicine

## 2020-04-01 MED FILL — PANTOPRAZOLE SOD DR 40 MG T: 40 | 90 days supply | Qty: 90 | Fill #0

## 2020-04-08 ENCOUNTER — Encounter: Payer: Self-pay | Admitting: Family Medicine

## 2020-04-10 ENCOUNTER — Ambulatory Visit: Payer: No Typology Code available for payment source | Admitting: Family Medicine

## 2020-04-15 ENCOUNTER — Encounter: Payer: Self-pay | Admitting: Family Medicine

## 2020-04-15 ENCOUNTER — Ambulatory Visit (INDEPENDENT_AMBULATORY_CARE_PROVIDER_SITE_OTHER): Payer: No Typology Code available for payment source | Admitting: Family Medicine

## 2020-04-15 ENCOUNTER — Other Ambulatory Visit: Payer: Self-pay

## 2020-04-15 VITALS — BP 139/83 | HR 83 | Wt 228.6 lb

## 2020-04-15 DIAGNOSIS — F419 Anxiety disorder, unspecified: Secondary | ICD-10-CM

## 2020-04-15 DIAGNOSIS — J310 Chronic rhinitis: Secondary | ICD-10-CM

## 2020-04-15 DIAGNOSIS — E039 Hypothyroidism, unspecified: Secondary | ICD-10-CM | POA: Diagnosis not present

## 2020-04-15 DIAGNOSIS — I10 Essential (primary) hypertension: Secondary | ICD-10-CM

## 2020-04-15 DIAGNOSIS — R350 Frequency of micturition: Secondary | ICD-10-CM

## 2020-04-15 DIAGNOSIS — Z23 Encounter for immunization: Secondary | ICD-10-CM

## 2020-04-15 DIAGNOSIS — E291 Testicular hypofunction: Secondary | ICD-10-CM

## 2020-04-15 DIAGNOSIS — N401 Enlarged prostate with lower urinary tract symptoms: Secondary | ICD-10-CM

## 2020-04-15 DIAGNOSIS — E119 Type 2 diabetes mellitus without complications: Secondary | ICD-10-CM

## 2020-04-15 NOTE — Patient Instructions (Signed)
Great to see you today! Please have labs completed Let's plan to follow up in 6 months.

## 2020-04-15 NOTE — Assessment & Plan Note (Signed)
Diabetes has been well controlled historically.  Update a1c and continue current medications.  Reminded to have updated eye exam.

## 2020-04-15 NOTE — Assessment & Plan Note (Signed)
Blood pressure is at goal at for age and co-morbidities.  I recommend continuation of current medications.  In addition they were instructed to follow a low sodium diet with regular exercise to help to maintain adequate control of blood pressure.  ? ?

## 2020-04-15 NOTE — Assessment & Plan Note (Signed)
He stopped clomid as he has not noticed any significant changes taking this.  Update testosterone levels.

## 2020-04-15 NOTE — Progress Notes (Signed)
Mark Black - 54 y.o. male MRN [id number]  Date of birth: 02-Jul-1966  Subjective Chief Complaint  Patient presents with  . Hypertension  . Diabetes    HPI Mark Black is a 54 y.o. male here today for follow up visit.  He has a history of HTN, hypothyroidism, T2DM, low testosterone.  His diabetes, hypothyroidism and hypogonadism was previously managed by endocrinology however conditions were well controlled so he had asked me to manage.    -HTN:  Current management with amlodipine/benazepril and bisoprolol/hctz.  He is tolerating medications well.  He denies symptoms of related to HTN including chest pain, shortness of breath, headache or vision change.   -T2DM:  Diabetes is managed with metformin and farxiga.  He continues to do well with this.  He denies side effects related to medication.  He is not monitoring blood sugars at home but overall feels good.   -Hypothyroidism:  No symptoms of hypo/hyper-thyroidism.  Compliant with levothyroxine.   -Hypogonadism:  He was taking clomid previously however he discontinued as he didn't really notice a big difference.  He denies any changes to how he feels since stopping this.   -Depression/anxiety:  Remains well controlled with combination of Pristiq and bupropion.  No side effects at this time.   Requests referral to ENT for chronic nasal congestion.  Has tried several nasal sprays without significant improvement.    ROS:  A comprehensive ROS was completed and negative except as noted per HPI  No Known Allergies  Past Medical History:  Diagnosis Date  . Allergy   . Alopecia totalis   . Asthma    as a child  . Chicken pox   . Diabetes mellitus without complication ([place])   . Hypertension   . Thyroid disease   . Vitiligo     Past Surgical History:  Procedure Laterality Date  . CARPAL TUNNEL RELEASE     Bil  . NASAL SINUS SURGERY  1994   cut and open air passages/nostrils    Social History   Socioeconomic  History  . Marital status: Married    Spouse name: Not on file  . Number of children: Not on file  . Years of education: Not on file  . Highest education level: Not on file  Occupational History  . Not on file  Tobacco Use  . Smoking status: Never Smoker  . Smokeless tobacco: Never Used  Vaping Use  . Vaping Use: Never used  Substance and Sexual Activity  . Alcohol use: Not Currently  . Drug use: Not Currently  . Sexual activity: Not on file  Other Topics Concern  . Not on file  Social History Narrative  . Not on file   Social Determinants of Health   Financial Resource Strain:   . Difficulty of Paying Living Expenses: Not on file  Food Insecurity:   . Worried About Charity fundraiser in the Last Year: Not on file  . Ran Out of Food in the Last Year: Not on file  Transportation Needs:   . Lack of Transportation (Medical): Not on file  . Lack of Transportation (Non-Medical): Not on file  Physical Activity:   . Days of Exercise per Week: Not on file  . Minutes of Exercise per Session: Not on file  Stress:   . Feeling of Stress : Not on file  Social Connections:   . Frequency of Communication with Friends and Family: Not on file  . Frequency of Social Gatherings with  Friends and Family: Not on file  . Attends Religious Services: Not on file  . Active Member of Clubs or Organizations: Not on file  . Attends Archivist Meetings: Not on file  . Marital Status: Not on file    Family History  Problem Relation Age of Onset  . Diabetes Father   . Hypertension Father   . Diabetes Paternal Aunt   . Cancer Maternal Aunt        breast  . Diabetes Sister   . Hypertension Sister   . Diabetes Brother   . Colon cancer Neg Hx   . Colon polyps Neg Hx   . Esophageal cancer Neg Hx   . Stomach cancer Neg Hx   . Rectal cancer Neg Hx     Health Maintenance  Topic Date Due  . Hepatitis C Screening  Never done  . OPHTHALMOLOGY EXAM  Never done  . HIV Screening   Never done  . FOOT EXAM  08/21/2016  . HEMOGLOBIN A1C  04/10/2020  . TETANUS/TDAP  01/27/2027  . COLONOSCOPY  05/27/2028  . INFLUENZA VACCINE  Completed  . PNEUMOCOCCAL POLYSACCHARIDE VACCINE AGE 41-64 HIGH RISK  Completed  . COVID-19 Vaccine  Completed     ----------------------------------------------------------------------------------------------------------------------------------------------------------------------------------------------------------------- Physical Exam BP 139/83 (BP Location: Left Arm, Patient Position: Sitting, Cuff Size: Normal)   Pulse 83   Wt 228 lb 9.6 oz (103.7 kg)   SpO2 94%   BMI 32.57 kg/m   Physical Exam Constitutional:      Appearance: Normal appearance.  HENT:     Head: Normocephalic and atraumatic.  Eyes:     General: No scleral icterus. Cardiovascular:     Rate and Rhythm: Normal rate and regular rhythm.  Pulmonary:     Effort: Pulmonary effort is normal.     Breath sounds: Normal breath sounds.  Musculoskeletal:     Cervical back: Neck supple.  Skin:    General: Skin is warm and dry.  Neurological:     General: No focal deficit present.     Mental Status: He is alert.  Psychiatric:        Mood and Affect: Mood normal.        Behavior: Behavior normal.     ------------------------------------------------------------------------------------------------------------------------------------------------------------------------------------------------------------------- Assessment and Plan  Essential hypertension Blood pressure is at goal at for age and co-morbidities.  I recommend continuation of current medications.  In addition they were instructed to follow a low sodium diet with regular exercise to help to maintain adequate control of blood pressure.    Hypothyroidism No symptoms related to thyroid at this time.  Update TSH  T2DM (type 2 diabetes mellitus) ([place]) Diabetes has been well controlled historically.  Update a1c  and continue current medications.  Reminded to have updated eye exam.    Hypogonadism in male He stopped clomid as he has not noticed any significant changes taking this.  Update testosterone levels.   Anxiety He continues to do well with pristiq and bupropion, will continue at current strength.    No orders of the defined types were placed in this encounter.  Orders Placed This Encounter  Procedures  . Flu Vaccine QUAD 6+ mos PF IM (Fluarix Quad PF)  . COMPLETE METABOLIC PANEL WITH GFR  . CBC  . Lipid Profile  . TSH  . HgB A1c  . PSA  . Testosterone  . Ambulatory referral to ENT    Referral Priority:   Routine    Referral Type:   Consultation  Referral Reason:   Specialty Services Required    Requested Specialty:   Otolaryngology    Number of Visits Requested:   1     Return in about 6 months (around 10/14/2020) for HTN/DM.    This visit occurred during the SARS-CoV-2 public health emergency.  Safety protocols were in place, including screening questions prior to the visit, additional usage of staff PPE, and extensive cleaning of exam room while observing appropriate contact time as indicated for disinfecting solutions.

## 2020-04-15 NOTE — Assessment & Plan Note (Signed)
He continues to do well with pristiq and bupropion, will continue at current strength.

## 2020-04-15 NOTE — Assessment & Plan Note (Signed)
No symptoms related to thyroid at this time.  Update TSH

## 2020-05-08 ENCOUNTER — Other Ambulatory Visit: Payer: Self-pay | Admitting: Family Medicine

## 2020-05-08 MED FILL — TAMSULOSIN HCL 0.4 MG CAP: 0.4 | 90 days supply | Qty: 90 | Fill #2

## 2020-05-08 MED FILL — METFORMIN HCL ER 500 MG TB2: 500 | 90 days supply | Qty: 270 | Fill #0

## 2020-05-16 MED FILL — LEVOTHYROXINE SODIUM 150 MC: 150 | 90 days supply | Qty: 90 | Fill #2

## 2020-05-17 LAB — LIPID PANEL
Cholesterol: 91 mg/dL (ref <200)
HDL: 45 mg/dL (ref >=40)
LDL Cholesterol (Calc): 23 mg/dL (calc)
Non-HDL Cholesterol (Calc): 46 mg/dL (calc) (ref <130)
Total CHOL/HDL Ratio: 2 (calc) (ref <5.0)
Triglycerides: 150 mg/dL — ABNORMAL HIGH (ref <150)

## 2020-05-17 LAB — COMPLETE METABOLIC PANEL WITH GFR
AG Ratio: 2.3 (calc) (ref 1.0–2.5)
ALT: 52 U/L — ABNORMAL HIGH (ref 9–46)
AST: 47 U/L — ABNORMAL HIGH (ref 10–35)
Albumin: 4.9 g/dL (ref 3.6–5.1)
Alkaline phosphatase (APISO): 67 U/L (ref 35–144)
BUN: 13 mg/dL (ref 7–25)
CO2: 25 mmol/L (ref 20–32)
Calcium: 9.7 mg/dL (ref 8.6–10.3)
Chloride: 100 mmol/L (ref 98–110)
Creat: 0.83 mg/dL (ref 0.70–1.33)
GFR, Est African American: 114 mL/min/{1.73_m2} (ref >=60)
GFR, Est Non African American: 98 mL/min/{1.73_m2} (ref >=60)
Globulin: 2.1 g/dL (calc) (ref 1.9–3.7)
Glucose, Bld: 139 mg/dL — ABNORMAL HIGH (ref 65–99)
Potassium: 4 mmol/L (ref 3.5–5.3)
Sodium: 138 mmol/L (ref 135–146)
Total Bilirubin: 0.7 mg/dL (ref 0.2–1.2)
Total Protein: 7 g/dL (ref 6.1–8.1)

## 2020-05-17 LAB — CBC
HCT: 46.4 % (ref 38.5–50.0)
Hemoglobin: 16.7 g/dL (ref 13.2–17.1)
MCH: 32.7 pg (ref 27.0–33.0)
MCHC: 36 g/dL (ref 32.0–36.0)
MCV: 91 fL (ref 80.0–100.0)
MPV: 9 fL (ref 7.5–12.5)
Platelets: 178 10*3/uL (ref 140–400)
RBC: 5.1 10*6/uL (ref 4.20–5.80)
RDW: 12.3 % (ref 11.0–15.0)
WBC: 7.5 10*3/uL (ref 3.8–10.8)

## 2020-05-17 LAB — HEMOGLOBIN A1C
Hgb A1c MFr Bld: 5.9 % of total Hgb — ABNORMAL HIGH (ref <5.7)
Mean Plasma Glucose: 123 (calc)
eAG (mmol/L): 6.8 (calc)

## 2020-05-17 LAB — TSH: TSH: 5.81 mIU/L — ABNORMAL HIGH (ref 0.40–4.50)

## 2020-05-17 LAB — TESTOSTERONE: Testosterone: 518 ng/dL (ref 250–827)

## 2020-05-17 LAB — PSA: PSA: 0.42 ng/mL (ref <=4.0)

## 2020-05-22 ENCOUNTER — Other Ambulatory Visit: Payer: Self-pay | Admitting: Family Medicine

## 2020-05-22 DIAGNOSIS — E039 Hypothyroidism, unspecified: Secondary | ICD-10-CM

## 2020-05-22 MED ORDER — LEVOTHYROXINE SODIUM 175 MCG PO TABS
175.0000 ug | ORAL_TABLET | Freq: Every day | ORAL | 1 refills | Status: DC
Start: 2020-05-22 — End: 2020-10-11

## 2020-05-22 MED FILL — LEVOTHYROXINE 175 MCG TABLE: 175 | 90 days supply | Qty: 90 | Fill #0

## 2020-05-28 MED FILL — BISOPROLOL-HCTZ 5-6.25 MG T: 5-6.25 | 90 days supply | Qty: 90 | Fill #1

## 2020-05-28 MED FILL — AMLODIPINE-BENAZ 10-40 MG: 10-40 | 90 days supply | Qty: 90 | Fill #1

## 2020-06-12 MED FILL — buPROPion HCL ER (XL) 150 M: 150 | 90 days supply | Qty: 90 | Fill #1

## 2020-06-12 MED FILL — DESVENLAFAXINE SUC ER 100 M: 100 | 90 days supply | Qty: 90 | Fill #2

## 2020-06-20 ENCOUNTER — Telehealth (INDEPENDENT_AMBULATORY_CARE_PROVIDER_SITE_OTHER): Payer: No Typology Code available for payment source | Admitting: Family Medicine

## 2020-06-20 ENCOUNTER — Encounter: Payer: Self-pay | Admitting: Family Medicine

## 2020-06-20 ENCOUNTER — Other Ambulatory Visit: Payer: Self-pay | Admitting: Family Medicine

## 2020-06-20 VITALS — BP 132/90 | HR 100 | Temp 99.6°F | Wt 216.0 lb

## 2020-06-20 DIAGNOSIS — J42 Unspecified chronic bronchitis: Secondary | ICD-10-CM

## 2020-06-20 DIAGNOSIS — Z20822 Contact with and (suspected) exposure to covid-19: Secondary | ICD-10-CM

## 2020-06-20 HISTORY — DX: Contact with and (suspected) exposure to covid-19: Z20.822

## 2020-06-20 LAB — POCT INFLUENZA A/B
Influenza A, POC: NEGATIVE
Influenza B, POC: NEGATIVE

## 2020-06-20 MED ORDER — ONDANSETRON 4 MG PO TBDP: 4.0000 mg | ORAL_TABLET | Freq: Three times a day (TID) | ORAL | 0 refills | Status: DC | PRN

## 2020-06-20 MED FILL — ONDANSETRON ODT 4 MG TABLET: 4 | 7 days supply | Qty: 20 | Fill #0

## 2020-06-20 NOTE — Progress Notes (Signed)
Mark Black - 54 y.o. male MRN [id number]  Date of birth: 13-Mar-1966   This visit type was conducted due to national recommendations for restrictions regarding the COVID-19 Pandemic (e.g. social distancing).  This format is felt to be most appropriate for this patient at this time.  All issues noted in this document were discussed and addressed.  No physical exam was performed (except for noted visual exam findings with Video Visits).  I discussed the limitations of evaluation and management by telemedicine and the availability of in person appointments. The patient expressed understanding and agreed to proceed.  I connected with@ on 06/20/20 at  2:00 PM EST by a video enabled telemedicine application and verified that I am speaking with the correct person using two identifiers.  Present at visit: Luetta Nutting, DO Dorice Lamas   Patient Location: [place] [address]   Provider location:   [facility]  Chief Complaint  Patient presents with  . Cough  . Sore Throat    HPI  Mark Black is a 54 y.o. male who presents via audio/video conferencing for a telehealth visit today.  He has complaint of congestion, sore throat, cough, headache, nausea, vomiting, diarrhea, body aches.  He had some mild cold symptoms that started 2 weeks ago, but these were mild.  Current symptoms started just a couple of days ago.  He doesn't think he has had fever.  He hasn't had much of an appetite but is drinking fluids.    ROS:  A comprehensive ROS was completed and negative except as noted per HPI  Past Medical History:  Diagnosis Date  . Allergy   . Alopecia totalis   . Asthma    as a child  . Chicken pox   . Diabetes mellitus without complication ([place])   . Hypertension   . Thyroid disease   . Vitiligo     Past Surgical History:  Procedure Laterality Date  . CARPAL TUNNEL RELEASE     Bil  . NASAL SINUS SURGERY  1994   cut and open air passages/nostrils     Family History  Problem Relation Age of Onset  . Diabetes Father   . Hypertension Father   . Diabetes Paternal Aunt   . Cancer Maternal Aunt        breast  . Diabetes Sister   . Hypertension Sister   . Diabetes Brother   . Colon cancer Neg Hx   . Colon polyps Neg Hx   . Esophageal cancer Neg Hx   . Stomach cancer Neg Hx   . Rectal cancer Neg Hx     Social History   Socioeconomic History  . Marital status: Married    Spouse name: Not on file  . Number of children: Not on file  . Years of education: Not on file  . Highest education level: Not on file  Occupational History  . Not on file  Tobacco Use  . Smoking status: Never Smoker  . Smokeless tobacco: Never Used  Vaping Use  . Vaping Use: Never used  Substance and Sexual Activity  . Alcohol use: Not Currently  . Drug use: Not Currently  . Sexual activity: Not on file  Other Topics Concern  . Not on file  Social History Narrative  . Not on file   Social Determinants of Health   Financial Resource Strain: Not on file  Food Insecurity: Not on file  Transportation Needs: Not on file  Physical Activity: Not  on file  Stress: Not on file  Social Connections: Not on file  Intimate Partner Violence: Not on file     Current Outpatient Medications:  .  amLODipine-benazepril (LOTREL) 10-40 MG capsule, TAKE 1 CAPSULE BY MOUTH ONCE DAILY, Disp: 90 capsule, Rfl: 1 .  bisoprolol-hydrochlorothiazide (ZIAC) 5-6.25 MG tablet, TAKE 1 TABLET BY MOUTH DAILY., Disp: 90 tablet, Rfl: 1 .  Blood Glucose Monitoring Suppl (FREESTYLE LITE) DEVI, Use as instructed to check blood sugar once a day at various times, Disp: 100 each, Rfl: 3 .  buPROPion (WELLBUTRIN XL) 150 MG 24 hr tablet, Take 1 tablet (150 mg total) by mouth daily., Disp: 90 tablet, Rfl: 1 .  dapagliflozin propanediol (FARXIGA) 5 MG TABS tablet, Take 5 mg by mouth daily., Disp: 90 tablet, Rfl: 2 .  desvenlafaxine (PRISTIQ) 100 MG 24 hr tablet, TAKE 1 TABLET BY MOUTH  DAILY., Disp: 90 tablet, Rfl: 3 .  fluticasone (FLONASE) 50 MCG/ACT nasal spray, Place 2 sprays into both nostrils daily., Disp: 16 g, Rfl: 6 .  glucose blood test strip, Use as instructed, Disp: 100 each, Rfl: 12 .  Lancets (FREESTYLE) lancets, Use as instructed, Disp: 100 each, Rfl: 12 .  levothyroxine (SYNTHROID) 175 MCG tablet, Take 1 tablet (175 mcg total) by mouth daily., Disp: 90 tablet, Rfl: 1 .  loratadine (CLARITIN) 10 MG tablet, Take 10 mg by mouth daily., Disp: , Rfl:  .  metFORMIN (GLUCOPHAGE-XR) 500 MG 24 hr tablet, TAKE 3 TABLETS (1,500 MG TOTAL) BY MOUTH DAILY WITH SUPPER., Disp: 270 tablet, Rfl: 0 .  Multiple Vitamins-Minerals (MULTIVITAMIN ADULT PO), Take by mouth., Disp: , Rfl:  .  pantoprazole (PROTONIX) 40 MG tablet, TAKE 1 TABLET BY MOUTH ONCE DAILY., Disp: 90 tablet, Rfl: 0 .  sodium chloride (OCEAN) 0.65 % SOLN nasal spray, Place 1 spray into both nostrils as needed., Disp: 60 mL, Rfl: 0 .  tamsulosin (FLOMAX) 0.4 MG CAPS capsule, Take 1 tablet by mouth once daily. **Needs appt for further refills.**, Disp: 90 capsule, Rfl: 2 .  triamcinolone cream (KENALOG) 0.1 %, Apply 1 application topically 2 (two) times daily., Disp: 30 g, Rfl: 0 .  vitamin B-12 (CYANOCOBALAMIN) 1000 MCG tablet, Take 1,000 mcg by mouth daily., Disp: , Rfl:  .  ondansetron (ZOFRAN ODT) 4 MG disintegrating tablet, Take 1 tablet (4 mg total) by mouth every 8 (eight) hours as needed for nausea or vomiting., Disp: 20 tablet, Rfl: 0  Current Facility-Administered Medications:  .  0.9 %  sodium chloride infusion, 500 mL, Intravenous, Once, Armbruster, Carlota Raspberry, MD .  ondansetron (ZOFRAN-ODT) disintegrating tablet 8 mg, 8 mg, Oral, Once, Wiseman, [place] D, PA-C  EXAM:  VITALS per patient if applicable: BP 329/51   Pulse 100   Temp 99.6 F (37.6 C)   Wt 216 lb (98 kg)   BMI 30.77 kg/m   GENERAL: alert, oriented, appears well and in no acute distress  HEENT: atraumatic, conjunttiva clear, no  obvious abnormalities on inspection of external nose and ears  NECK: normal movements of the head and neck  LUNGS: on inspection no signs of respiratory distress, breathing rate appears normal, no obvious gross SOB, gasping or wheezing  CV: no obvious cyanosis  MS: moves all visible extremities without noticeable abnormality  PSYCH/NEURO: pleasant and cooperative, no obvious depression or anxiety, speech and thought processing grossly intact  ASSESSMENT AND PLAN:  Discussed the following assessment and plan:  Suspected COVID-19 virus infection He has symptoms that are concerning for Covid infection.  I will have her stop by her clinic to have Covid testing. We will also check a rapid flu test while he is here. I have sent over some Zofran to help with management of his nausea. He may also use Pepto-Bismol or Imodium as needed for diarrhea. I instructed him to increase his fluid intake, follow bland diet for now. We reviewed red flags that would prompt him to seek emergency care. With comorbidities I would like to get him set up for monoclonal antibody if he test positive for Covid.     I discussed the assessment and treatment plan with the patient. The patient was provided an opportunity to ask questions and all were answered. The patient agreed with the plan and demonstrated an understanding of the instructions.   The patient was advised to call back or seek an in-person evaluation if the symptoms worsen or if the condition fails to improve as anticipated.    Luetta Nutting, DO

## 2020-06-20 NOTE — Assessment & Plan Note (Signed)
He has symptoms that are concerning for Covid infection. I will have her stop by her clinic to have Covid testing. We will also check a rapid flu test while he is here. I have sent over some Zofran to help with management of his nausea. He may also use Pepto-Bismol or Imodium as needed for diarrhea. I instructed him to increase his fluid intake, follow bland diet for now. We reviewed red flags that would prompt him to seek emergency care. With comorbidities I would like to get him set up for monoclonal antibody if he test positive for Covid.

## 2020-06-20 NOTE — Progress Notes (Signed)
Sx started 2 weeks prior with head cold sx's. Took Nyquil PM & Dayquil  This week loose stool started with emesis.  Sore throat Headaches Sinus issues

## 2020-06-21 ENCOUNTER — Other Ambulatory Visit: Payer: Self-pay | Admitting: Family Medicine

## 2020-06-21 ENCOUNTER — Encounter: Payer: Self-pay | Admitting: Family Medicine

## 2020-06-21 LAB — NOVEL CORONAVIRUS, NAA: SARS-CoV-2, NAA: DETECTED — AB

## 2020-06-21 LAB — SARS-COV-2, NAA 2 DAY TAT

## 2020-06-21 MED ORDER — DOXYCYCLINE HYCLATE 100 MG PO TABS: 100.0000 mg | ORAL_TABLET | Freq: Two times a day (BID) | ORAL | 0 refills | Status: DC

## 2020-06-21 MED FILL — DOXYCYCLINE HYCLATE 100 MG: 100 | 10 days supply | Qty: 20 | Fill #0

## 2020-06-22 ENCOUNTER — Telehealth: Payer: Self-pay | Admitting: Nurse Practitioner

## 2020-06-22 NOTE — Telephone Encounter (Signed)
Referral from PCP for evaluation for treatment for mild to moderate COVID-19 symptoms with mAb infusion.   The patient does have qualifying risk factors for use of mAb infusion treatment, but at this time has had symptoms for >2 weeks and is, unfortunately, not eligible for the infusion treatment at this time as EUA for mAb Infusion requires the patient be within 10 days of initial symptom onset to qualify.

## 2020-06-23 ENCOUNTER — Telehealth (HOSPITAL_COMMUNITY): Payer: Self-pay | Admitting: Family

## 2020-06-23 ENCOUNTER — Other Ambulatory Visit (HOSPITAL_COMMUNITY): Payer: Self-pay | Admitting: Family

## 2020-06-23 ENCOUNTER — Encounter: Payer: Self-pay | Admitting: Family Medicine

## 2020-06-23 DIAGNOSIS — U071 COVID-19: Secondary | ICD-10-CM

## 2020-06-23 NOTE — Progress Notes (Signed)
I connected by phone with Mark Black on 06/23/2020 at 12:07 PM to discuss the potential use of a new treatment for mild to moderate COVID-19 viral infection in non-hospitalized patients.  This patient is a 54 y.o. male that meets the FDA criteria for Emergency Use Authorization of COVID monoclonal antibody casirivimab/imdevimab, bamlanivimab/eteseviamb, or sotrovimab.  Has a (+) direct SARS-CoV-2 viral test result  Has mild or moderate COVID-19   Is NOT hospitalized due to COVID-19  Is within 10 days of symptom onset  Has at least one of the high risk factor(s) for progression to severe COVID-19 and/or hospitalization as defined in EUA.  Specific high risk criteria : Diabetes and Cardiovascular disease or hypertension   Symptoms of fever, fatigue, diarrhea and vomiting started 06/18/20.   I have spoken and communicated the following to the patient or parent/caregiver regarding COVID monoclonal antibody treatment:  1. FDA has authorized the emergency use for the treatment of mild to moderate COVID-19 in adults and pediatric patients with positive results of direct SARS-CoV-2 viral testing who are 49 years of age and older weighing at least 40 kg, and who are at high risk for progressing to severe COVID-19 and/or hospitalization.  2. The significant known and potential risks and benefits of COVID monoclonal antibody, and the extent to which such potential risks and benefits are unknown.  3. Information on available alternative treatments and the risks and benefits of those alternatives, including clinical trials.  4. Patients treated with COVID monoclonal antibody should continue to self-isolate and use infection control measures (e.g., wear mask, isolate, social distance, avoid sharing personal items, clean and disinfect "high touch" surfaces, and frequent handwashing) according to CDC guidelines.   5. The patient or parent/caregiver has the option to accept or refuse COVID  monoclonal antibody treatment.  After reviewing this information with the patient, the patient has agreed to receive one of the available covid 19 monoclonal antibodies and will be provided an appropriate fact sheet prior to infusion. Asencion Gowda, NP 06/23/2020 12:07 PM

## 2020-06-25 ENCOUNTER — Ambulatory Visit (HOSPITAL_COMMUNITY)
Admission: RE | Admit: 2020-06-25 | Discharge: 2020-06-25 | Disposition: A | Discharge status: Home / Self Care | Payer: No Typology Code available for payment source | Source: Ambulatory Visit | Attending: Pulmonary Disease | Admitting: Pulmonary Disease

## 2020-06-25 DIAGNOSIS — U071 COVID-19: Secondary | ICD-10-CM | POA: Diagnosis present

## 2020-06-25 MED ORDER — FAMOTIDINE IN NACL 20-0.9 MG/50ML-% IV SOLN: 20.0000 mg | Freq: Once | INTRAVENOUS | Status: DC | PRN

## 2020-06-25 MED ORDER — DIPHENHYDRAMINE HCL 50 MG/ML IJ SOLN: 50.0000 mg | Freq: Once | INTRAMUSCULAR | Status: DC | PRN

## 2020-06-25 MED ORDER — METHYLPREDNISOLONE SODIUM SUCC 125 MG IJ SOLR: 125.0000 mg | Freq: Once | INTRAMUSCULAR | Status: DC | PRN

## 2020-06-25 MED ORDER — EPINEPHRINE 0.3 MG/0.3ML IJ SOAJ: 0.3000 mg | Freq: Once | INTRAMUSCULAR | Status: DC | PRN

## 2020-06-25 MED ORDER — ALBUTEROL SULFATE HFA 108 (90 BASE) MCG/ACT IN AERS: 2.0000 | INHALATION_SPRAY | Freq: Once | RESPIRATORY_TRACT | Status: DC | PRN

## 2020-06-25 MED ORDER — SODIUM CHLORIDE 0.9 % IV SOLN: Freq: Once | INTRAVENOUS | Status: AC

## 2020-06-25 MED ORDER — SODIUM CHLORIDE 0.9 % IV SOLN: INTRAVENOUS | Status: DC | PRN

## 2020-06-25 NOTE — Progress Notes (Signed)
Patient reviewed Fact Sheet for Patients, Parents, and Caregivers for Emergency Use Authorization (EUA) of bamlanivimab/eteseviamb for the Treatment of Coronavirus.  Patient also reviewed and is agreeable to the estimated cost of treatment.  Patient is agreeable to proceed.

## 2020-06-25 NOTE — Progress Notes (Signed)
  Diagnosis: COVID-19  Physician: Dr. Asencion Noble   Procedure:  Medication fact sheet provided to patient; all questions answered.  Allergies reviewed with patient.  IV placed.  Bamlanivimab/eteseviamb administered via IV infusion.   Complications: No immediate complications noted.  Discharge: Discharged home   Monna Fam 06/25/2020

## 2020-06-25 NOTE — Discharge Instructions (Signed)
10 Things You Can Do to Manage Your COVID-19 Symptoms at Home If you have possible or confirmed COVID-19: 1. Stay home from work and school. And stay away from other public places. If you must go out, avoid using any kind of public transportation, ridesharing, or taxis. 2. Monitor your symptoms carefully. If your symptoms get worse, call your healthcare provider immediately. 3. Get rest and stay hydrated. 4. If you have a medical appointment, call the healthcare provider ahead of time and tell them that you have or may have COVID-19. 5. For medical emergencies, call 911 and notify the dispatch personnel that you have or may have COVID-19. 6. Cover your cough and sneezes with a tissue or use the inside of your elbow. 7. Wash your hands often with soap and water for at least 20 seconds or clean your hands with an alcohol-based hand sanitizer that contains at least 60% alcohol. 8. As much as possible, stay in a specific room and away from other people in your home. Also, you should use a separate bathroom, if available. If you need to be around other people in or outside of the home, wear a mask. 9. Avoid sharing personal items with other people in your household, like dishes, towels, and bedding. 10. Clean all surfaces that are touched often, like counters, tabletops, and doorknobs. Use household cleaning sprays or wipes according to the label instructions. cdc.gov/coronavirus 01/11/2019 This information is not intended to replace advice given to you by your health care provider. Make sure you discuss any questions you have with your health care provider. Document Revised: 06/15/2019 Document Reviewed: 06/15/2019 Elsevier Patient Education  2020 Elsevier Inc. What types of side effects do monoclonal antibody drugs cause?  Common side effects  In general, the more common side effects caused by monoclonal antibody drugs include: . Allergic reactions, such as hives or itching . Flu-like signs and  symptoms, including chills, fatigue, fever, and muscle aches and pains . Nausea, vomiting . Diarrhea . Skin rashes . Low blood pressure   The CDC is recommending patients who receive monoclonal antibody treatments wait at least 90 days before being vaccinated.  Currently, there are no data on the safety and efficacy of mRNA COVID-19 vaccines in persons who received monoclonal antibodies or convalescent plasma as part of COVID-19 treatment. Based on the estimated half-life of such therapies as well as evidence suggesting that reinfection is uncommon in the 90 days after initial infection, vaccination should be deferred for at least 90 days, as a precautionary measure until additional information becomes available, to avoid interference of the antibody treatment with vaccine-induced immune responses. If you have any questions or concerns after the infusion please call the Advanced Practice Provider on call at [phone]. This number is ONLY intended for your use regarding questions or concerns about the infusion post-treatment side-effects.  Please do not provide this number to others for use. For return to work notes please contact your primary care provider.   If someone you know is interested in receiving treatment please have them call the COVID hotline at [phone].   

## 2020-06-27 ENCOUNTER — Encounter: Payer: Self-pay | Admitting: Family Medicine

## 2020-07-15 ENCOUNTER — Other Ambulatory Visit: Payer: Self-pay | Admitting: Family Medicine

## 2020-07-15 MED FILL — FARXIGA 5 MG TABLET: 5 | 90 days supply | Qty: 90 | Fill #1

## 2020-07-15 MED FILL — PANTOPRAZOLE SOD DR 40 MG T: 40 | 90 days supply | Qty: 90 | Fill #0

## 2020-07-16 ENCOUNTER — Other Ambulatory Visit: Payer: Self-pay | Admitting: Family Medicine

## 2020-07-16 MED FILL — ONDANSETRON ODT 4 MG TABLET: 4 | 7 days supply | Qty: 20 | Fill #0

## 2020-07-18 NOTE — Telephone Encounter (Signed)
error 

## 2020-07-22 ENCOUNTER — Encounter: Payer: Self-pay | Admitting: Family Medicine

## 2020-08-13 ENCOUNTER — Encounter: Payer: Self-pay | Admitting: Family Medicine

## 2020-08-14 ENCOUNTER — Telehealth (INDEPENDENT_AMBULATORY_CARE_PROVIDER_SITE_OTHER): Payer: No Typology Code available for payment source | Admitting: Family Medicine

## 2020-08-14 ENCOUNTER — Encounter: Payer: Self-pay | Admitting: Family Medicine

## 2020-08-14 ENCOUNTER — Other Ambulatory Visit: Payer: Self-pay | Admitting: Family Medicine

## 2020-08-14 DIAGNOSIS — J0100 Acute maxillary sinusitis, unspecified: Secondary | ICD-10-CM | POA: Diagnosis not present

## 2020-08-14 MED ORDER — AZITHROMYCIN 250 MG PO TABS: ORAL_TABLET | ORAL | 0 refills | Status: DC

## 2020-08-14 MED FILL — AZITHROMYCIN 250 MG TABLET: 250 | 5 days supply | Qty: 6 | Fill #0

## 2020-08-14 NOTE — Progress Notes (Signed)
Mark Black - 55 y.o. male MRN [id number]  Date of birth: Mar 27, 1966   This visit type was conducted due to national recommendations for restrictions regarding the COVID-19 Pandemic (e.g. social distancing).  This format is felt to be most appropriate for this patient at this time.  All issues noted in this document were discussed and addressed.  No physical exam was performed (except for noted visual exam findings with Video Visits).  I discussed the limitations of evaluation and management by telemedicine and the availability of in person appointments. The patient expressed understanding and agreed to proceed.  I connected with@ on 08/14/20 at  2:40 PM EST by a video enabled telemedicine application and verified that I am speaking with the correct person using two identifiers.  Present at visit: Luetta Nutting, DO Dorice Lamas   Patient Location: [place] [id number]   Provider location:   [place]  No chief complaint on file.   HPI  Mark Black is a 55 y.o. male who presents via audio/video conferencing for a telehealth visit today.  He has complaint today of congestion with sinus pain, mild headaches, mild cough.  Symptoms started about 2 weeks ago.  He did also have diarrhea today.  Mild nausea without vomiting.  Pain in sinuses severe.  He denies fever, chills, headache, shortness of breath.   He did have COVID in 12/21.   ROS:  A comprehensive ROS was completed and negative except as noted per HPI  Past Medical History:  Diagnosis Date  . Allergy   . Alopecia totalis   . Asthma    as a child  . Chicken pox   . Diabetes mellitus without complication ([place])   . Hypertension   . Thyroid disease   . Vitiligo     Past Surgical History:  Procedure Laterality Date  . CARPAL TUNNEL RELEASE     Bil  . NASAL SINUS SURGERY  1994   cut and open air passages/nostrils    Family History  Problem Relation Age of Onset  . Diabetes Father    . Hypertension Father   . Diabetes Paternal Aunt   . Cancer Maternal Aunt        breast  . Diabetes Sister   . Hypertension Sister   . Diabetes Brother   . Colon cancer Neg Hx   . Colon polyps Neg Hx   . Esophageal cancer Neg Hx   . Stomach cancer Neg Hx   . Rectal cancer Neg Hx     Social History   Socioeconomic History  . Marital status: Married    Spouse name: Not on file  . Number of children: Not on file  . Years of education: Not on file  . Highest education level: Not on file  Occupational History  . Not on file  Tobacco Use  . Smoking status: Never Smoker  . Smokeless tobacco: Never Used  Vaping Use  . Vaping Use: Never used  Substance and Sexual Activity  . Alcohol use: Not Currently  . Drug use: Not Currently  . Sexual activity: Not on file  Other Topics Concern  . Not on file  Social History Narrative  . Not on file   Social Determinants of Health   Financial Resource Strain: Not on file  Food Insecurity: Not on file  Transportation Needs: Not on file  Physical Activity: Not on file  Stress: Not on file  Social Connections: Not on file  Intimate Partner  Violence: Not on file     Current Outpatient Medications:  .  amLODipine-benazepril (LOTREL) 10-40 MG capsule, TAKE 1 CAPSULE BY MOUTH ONCE DAILY, Disp: 90 capsule, Rfl: 1 .  azithromycin (ZITHROMAX) 250 MG tablet, Take 2 tabs on day 1 followed by 1 tab on days 2-5., Disp: 6 tablet, Rfl: 0 .  bisoprolol-hydrochlorothiazide (ZIAC) 5-6.25 MG tablet, TAKE 1 TABLET BY MOUTH DAILY., Disp: 90 tablet, Rfl: 1 .  Blood Glucose Monitoring Suppl (FREESTYLE LITE) DEVI, Use as instructed to check blood sugar once a day at various times, Disp: 100 each, Rfl: 3 .  buPROPion (WELLBUTRIN XL) 150 MG 24 hr tablet, Take 1 tablet (150 mg total) by mouth daily., Disp: 90 tablet, Rfl: 1 .  dapagliflozin propanediol (FARXIGA) 5 MG TABS tablet, Take 5 mg by mouth daily., Disp: 90 tablet, Rfl: 2 .  desvenlafaxine (PRISTIQ)  100 MG 24 hr tablet, TAKE 1 TABLET BY MOUTH DAILY., Disp: 90 tablet, Rfl: 3 .  doxycycline (VIBRA-TABS) 100 MG tablet, Take 1 tablet (100 mg total) by mouth 2 (two) times daily., Disp: 20 tablet, Rfl: 0 .  fluticasone (FLONASE) 50 MCG/ACT nasal spray, Place 2 sprays into both nostrils daily., Disp: 16 g, Rfl: 6 .  glucose blood test strip, Use as instructed, Disp: 100 each, Rfl: 12 .  Lancets (FREESTYLE) lancets, Use as instructed, Disp: 100 each, Rfl: 12 .  levothyroxine (SYNTHROID) 175 MCG tablet, Take 1 tablet (175 mcg total) by mouth daily., Disp: 90 tablet, Rfl: 1 .  loratadine (CLARITIN) 10 MG tablet, Take 10 mg by mouth daily., Disp: , Rfl:  .  Multiple Vitamins-Minerals (MULTIVITAMIN ADULT PO), Take by mouth., Disp: , Rfl:  .  ondansetron (ZOFRAN-ODT) 4 MG disintegrating tablet, TAKE 1 TABLET (4 MG TOTAL) BY MOUTH EVERY 8 (EIGHT) HOURS AS NEEDED FOR NAUSEA OR VOMITING., Disp: 20 tablet, Rfl: 0 .  pantoprazole (PROTONIX) 40 MG tablet, TAKE 1 TABLET BY MOUTH ONCE DAILY., Disp: 90 tablet, Rfl: 0 .  sodium chloride (OCEAN) 0.65 % SOLN nasal spray, Place 1 spray into both nostrils as needed., Disp: 60 mL, Rfl: 0 .  tamsulosin (FLOMAX) 0.4 MG CAPS capsule, Take 1 tablet by mouth once daily. **Needs appt for further refills.**, Disp: 90 capsule, Rfl: 2 .  triamcinolone cream (KENALOG) 0.1 %, Apply 1 application topically 2 (two) times daily., Disp: 30 g, Rfl: 0 .  vitamin B-12 (CYANOCOBALAMIN) 1000 MCG tablet, Take 1,000 mcg by mouth daily., Disp: , Rfl:  .  metFORMIN (GLUCOPHAGE-XR) 500 MG 24 hr tablet, TAKE 3 TABLETS (1,500 MG TOTAL) BY MOUTH DAILY WITH SUPPER., Disp: 270 tablet, Rfl: 0  Current Facility-Administered Medications:  .  0.9 %  sodium chloride infusion, 500 mL, Intravenous, Once, Armbruster, Carlota Raspberry, MD .  ondansetron (ZOFRAN-ODT) disintegrating tablet 8 mg, 8 mg, Oral, Once, Timmothy Euler, [place] D, PA-C  EXAM:  VITALS per patient if applicable: Wt 216 lb (98 kg)   BMI 30.77  kg/m   GENERAL: alert, oriented, appears well and in no acute distress  HEENT: atraumatic, conjunttiva clear, no obvious abnormalities on inspection of external nose and ears  NECK: normal movements of the head and neck  LUNGS: on inspection no signs of respiratory distress, breathing rate appears normal, no obvious gross SOB, gasping or wheezing  CV: no obvious cyanosis  MS: moves all visible extremities without noticeable abnormality  PSYCH/NEURO: pleasant and cooperative, no obvious depression or anxiety, speech and thought processing grossly intact  ASSESSMENT AND PLAN:  Discussed the following  assessment and plan:  Acute maxillary sinusitis Prolonged symptoms x2 weeks. Will treat with azithromycin.  May have viral infection as well with recent development of diarrhea.  He will continue with supportive care as well with increased fluids and rest.  Instructed to contact clinic if not improving.      I discussed the assessment and treatment plan with the patient. The patient was provided an opportunity to ask questions and all were answered. The patient agreed with the plan and demonstrated an understanding of the instructions.   The patient was advised to call back or seek an in-person evaluation if the symptoms worsen or if the condition fails to improve as anticipated.    Luetta Nutting, DO

## 2020-08-14 NOTE — Telephone Encounter (Signed)
Patient scheduled for virtual visit this afternoon. AM

## 2020-08-14 NOTE — Assessment & Plan Note (Signed)
Prolonged symptoms x2 weeks. Will treat with azithromycin.  May have viral infection as well with recent development of diarrhea.  He will continue with supportive care as well with increased fluids and rest.  Instructed to contact clinic if not improving.

## 2020-08-14 NOTE — Progress Notes (Signed)
Symptoms start x 2 weeks.   Progressed until sinus pressure became worse last night.   Loose stool, nasal congestion

## 2020-08-15 ENCOUNTER — Other Ambulatory Visit: Payer: Self-pay | Admitting: Family Medicine

## 2020-08-16 ENCOUNTER — Other Ambulatory Visit: Payer: Self-pay | Admitting: Family Medicine

## 2020-08-16 MED FILL — TAMSULOSIN HCL 0.4 MG CAP: 0.4 | 90 days supply | Qty: 90 | Fill #0

## 2020-08-16 MED FILL — METFORMIN HCL ER 500 MG TB2: 500 | 90 days supply | Qty: 270 | Fill #0

## 2020-08-28 ENCOUNTER — Other Ambulatory Visit: Payer: Self-pay | Admitting: Family Medicine

## 2020-08-29 ENCOUNTER — Other Ambulatory Visit: Payer: Self-pay | Admitting: Family Medicine

## 2020-08-30 ENCOUNTER — Other Ambulatory Visit: Payer: Self-pay | Admitting: Family Medicine

## 2020-08-30 ENCOUNTER — Encounter: Payer: Self-pay | Admitting: Family Medicine

## 2020-08-30 MED FILL — BISOPROLOL-HCTZ 5-6.25 MG T: 5-6.25 | 90 days supply | Qty: 90 | Fill #0

## 2020-08-30 MED FILL — AMLODIPINE-BENAZ 10-40 MG: 10-40 | 90 days supply | Qty: 90 | Fill #0

## 2020-09-11 MED FILL — DESVENLAFAXINE SUC ER 100 M: 100 | 90 days supply | Qty: 90 | Fill #3

## 2020-09-17 ENCOUNTER — Other Ambulatory Visit: Payer: Self-pay | Admitting: Family Medicine

## 2020-09-17 MED FILL — buPROPion HCL ER (XL) 150 M: 150 | 90 days supply | Qty: 90 | Fill #0

## 2020-10-03 ENCOUNTER — Encounter: Payer: Self-pay | Admitting: Family Medicine

## 2020-10-04 ENCOUNTER — Ambulatory Visit: Payer: No Typology Code available for payment source | Admitting: Medical-Surgical

## 2020-10-10 ENCOUNTER — Encounter: Payer: Self-pay | Admitting: Family Medicine

## 2020-10-11 ENCOUNTER — Other Ambulatory Visit: Payer: Self-pay | Admitting: Family Medicine

## 2020-10-11 ENCOUNTER — Other Ambulatory Visit: Payer: Self-pay

## 2020-10-11 ENCOUNTER — Ambulatory Visit (INDEPENDENT_AMBULATORY_CARE_PROVIDER_SITE_OTHER): Payer: No Typology Code available for payment source | Admitting: Family Medicine

## 2020-10-11 ENCOUNTER — Encounter: Payer: Self-pay | Admitting: Family Medicine

## 2020-10-11 VITALS — BP 131/86 | HR 80 | Temp 97.9°F | Wt 217.6 lb

## 2020-10-11 DIAGNOSIS — I10 Essential (primary) hypertension: Secondary | ICD-10-CM | POA: Diagnosis not present

## 2020-10-11 DIAGNOSIS — R748 Abnormal levels of other serum enzymes: Secondary | ICD-10-CM | POA: Diagnosis not present

## 2020-10-11 DIAGNOSIS — E119 Type 2 diabetes mellitus without complications: Secondary | ICD-10-CM | POA: Diagnosis not present

## 2020-10-11 DIAGNOSIS — F419 Anxiety disorder, unspecified: Secondary | ICD-10-CM

## 2020-10-11 DIAGNOSIS — E039 Hypothyroidism, unspecified: Secondary | ICD-10-CM | POA: Diagnosis not present

## 2020-10-11 MED ORDER — DAPAGLIFLOZIN PROPANEDIOL 5 MG PO TABS: 5.0000 mg | ORAL_TABLET | Freq: Every day | ORAL | 2 refills | Status: DC

## 2020-10-11 MED ORDER — DESVENLAFAXINE SUCCINATE ER 100 MG PO TB24: ORAL_TABLET | ORAL | 3 refills | Status: DC

## 2020-10-11 MED ORDER — PANTOPRAZOLE SODIUM 40 MG PO TBEC: 40.0000 mg | DELAYED_RELEASE_TABLET | Freq: Every day | ORAL | 2 refills | Status: DC

## 2020-10-11 MED ORDER — METFORMIN HCL ER 500 MG PO TB24: 1500.0000 mg | ORAL_TABLET | Freq: Every day | ORAL | 2 refills | Status: DC

## 2020-10-11 MED ORDER — LEVOTHYROXINE SODIUM 175 MCG PO TABS: 175.0000 ug | ORAL_TABLET | Freq: Every day | ORAL | 1 refills | Status: DC

## 2020-10-11 MED FILL — LEVOTHYROXINE 175 MCG TABLE: 175 | 90 days supply | Qty: 90 | Fill #0

## 2020-10-11 MED FILL — PANTOPRAZOLE SOD DR 40 MG T: 40 | 90 days supply | Qty: 90 | Fill #0

## 2020-10-11 NOTE — Patient Instructions (Signed)
Have labs completed.  See me again in 6 months.

## 2020-10-12 LAB — HEPATIC FUNCTION PANEL
AG Ratio: 2.2 (calc) (ref 1.0–2.5)
ALT: 61 U/L — ABNORMAL HIGH (ref 9–46)
AST: 58 U/L — ABNORMAL HIGH (ref 10–35)
Albumin: 4.8 g/dL (ref 3.6–5.1)
Alkaline phosphatase (APISO): 62 U/L (ref 35–144)
Bilirubin, Direct: 0.2 mg/dL (ref 0.0–0.2)
Globulin: 2.2 g/dL (calc) (ref 1.9–3.7)
Indirect Bilirubin: 0.4 mg/dL (calc) (ref 0.2–1.2)
Total Bilirubin: 0.6 mg/dL (ref 0.2–1.2)
Total Protein: 7 g/dL (ref 6.1–8.1)

## 2020-10-12 LAB — HEMOGLOBIN A1C
Hgb A1c MFr Bld: 7.1 % of total Hgb — ABNORMAL HIGH (ref <5.7)
Mean Plasma Glucose: 157 mg/dL
eAG (mmol/L): 8.7 mmol/L

## 2020-10-12 LAB — TSH: TSH: 2.85 mIU/L (ref 0.40–4.50)

## 2020-10-13 DIAGNOSIS — R748 Abnormal levels of other serum enzymes: Secondary | ICD-10-CM | POA: Insufficient documentation

## 2020-10-13 HISTORY — DX: Abnormal levels of other serum enzymes: R74.8

## 2020-10-13 NOTE — Assessment & Plan Note (Signed)
Blood pressure is at goal at for age and co-morbidities.  I recommend continuation of current medications.  In addition they were instructed to follow a low sodium diet with regular exercise to help to maintain adequate control of blood pressure.  ? ?

## 2020-10-13 NOTE — Assessment & Plan Note (Signed)
Update TSH.  Stable symptoms at this time.

## 2020-10-13 NOTE — Progress Notes (Signed)
Mark Black - 55 y.o. male MRN [id number]  Date of birth: [birth year]  Subjective Chief Complaint  Patient presents with  . Hypertension  . Diabetes    HPI Mark Black is a 55 y.o. male here today for follow up of HTN, T2DM, hypothyroidism and anxiety.   HTN managed with combinatio of bisoprolol/hctz and amlodipine/benazepril.  He is doing well with this.  He denies side effects from medication.  BP has remained well controlled.  He denies chest pain, shortness of breath, palpitations, headache or vision changes.   He has been managing diabetes with metformin and farxiga.  Feels good with current dose and denies significant side effects.  No symptoms related to diabetes.   Feels good at current dose of levothyroxine.  No increased fatigue or significant weight change.    Still feels like pristiq is working well for him.  Helps with management of anxiety and temper.    ROS:  A comprehensive ROS was completed and negative except as noted per HPI  No Known Allergies  Past Medical History:  Diagnosis Date  . Allergy   . Alopecia totalis   . Asthma    as a child  . Chicken pox   . Diabetes mellitus without complication ([place])   . Hypertension   . Thyroid disease   . Vitiligo     Past Surgical History:  Procedure Laterality Date  . CARPAL TUNNEL RELEASE     Bil  . NASAL SINUS SURGERY  1994   cut and open air passages/nostrils    Social History   Socioeconomic History  . Marital status: Married    Spouse name: Not on file  . Number of children: Not on file  . Years of education: Not on file  . Highest education level: Not on file  Occupational History  . Not on file  Tobacco Use  . Smoking status: Never Smoker  . Smokeless tobacco: Never Used  Vaping Use  . Vaping Use: Never used  Substance and Sexual Activity  . Alcohol use: Not Currently  . Drug use: Not Currently  . Sexual activity: Not on file  Other Topics Concern  . Not on file  Social  History Narrative  . Not on file   Social Determinants of Health   Financial Resource Strain: Not on file  Food Insecurity: Not on file  Transportation Needs: Not on file  Physical Activity: Not on file  Stress: Not on file  Social Connections: Not on file    Family History  Problem Relation Age of Onset  . Diabetes Father   . Hypertension Father   . Diabetes Paternal Aunt   . Cancer Maternal Aunt        breast  . Diabetes Sister   . Hypertension Sister   . Diabetes Brother   . Colon cancer Neg Hx   . Colon polyps Neg Hx   . Esophageal cancer Neg Hx   . Stomach cancer Neg Hx   . Rectal cancer Neg Hx     Health Maintenance  Topic Date Due  . Hepatitis C Screening  Never done  . OPHTHALMOLOGY EXAM  Never done  . HIV Screening  Never done  . FOOT EXAM  08/21/2016  . COVID-19 Vaccine (3 - Booster for Pfizer series) 04/30/2020  . INFLUENZA VACCINE  02/10/2021  . HEMOGLOBIN A1C  04/12/2021  . TETANUS/TDAP  01/27/2027  . COLONOSCOPY (Pts 45-16yrs Insurance coverage will need to be confirmed)  05/27/2028  .  PNEUMOCOCCAL POLYSACCHARIDE VACCINE AGE 73-64 HIGH RISK  Completed  . HPV VACCINES  Aged Out     ----------------------------------------------------------------------------------------------------------------------------------------------------------------------------------------------------------------- Physical Exam BP 131/86 (BP Location: Left Arm, Patient Position: Sitting, Cuff Size: Large)   Pulse 80   Temp 97.9 F (36.6 C)   Wt 217 lb 9.6 oz (98.7 kg)   SpO2 98%   BMI 31.00 kg/m   Physical Exam Constitutional:      Appearance: Normal appearance.  HENT:     Head: Normocephalic and atraumatic.  Eyes:     General: No scleral icterus. Cardiovascular:     Rate and Rhythm: Normal rate and regular rhythm.  Pulmonary:     Effort: Pulmonary effort is normal.     Breath sounds: Normal breath sounds.  Musculoskeletal:     Cervical back: Neck supple.   Neurological:     General: No focal deficit present.     Mental Status: He is alert.  Psychiatric:        Mood and Affect: Mood normal.        Behavior: Behavior normal.     ------------------------------------------------------------------------------------------------------------------------------------------------------------------------------------------------------------------- Assessment and Plan  Essential hypertension Blood pressure is at goal at for age and co-morbidities.  I recommend continuation of current medications.  In addition they were instructed to follow a low sodium diet with regular exercise to help to maintain adequate control of blood pressure.    Hypothyroidism Update TSH.  Stable symptoms at this time.   T2DM (type 2 diabetes mellitus) ([place]) He is doing well with metformin and farxiga.  Update a1c  Elevated liver enzymes Recheck hepatic panel.  Anxiety Pristiq continues to work well for him.   Continue at current strength.     Meds ordered this encounter  Medications  . DISCONTD: dapagliflozin propanediol (FARXIGA) 5 MG TABS tablet    Sig: Take 1 tablet (5 mg total) by mouth daily.    Dispense:  90 tablet    Refill:  2  . DISCONTD: desvenlafaxine (PRISTIQ) 100 MG 24 hr tablet    Sig: TAKE 1 TABLET BY MOUTH DAILY.    Dispense:  90 tablet    Refill:  3  . DISCONTD: levothyroxine (SYNTHROID) 175 MCG tablet    Sig: Take 1 tablet (175 mcg total) by mouth daily.    Dispense:  90 tablet    Refill:  1  . DISCONTD: metFORMIN (GLUCOPHAGE-XR) 500 MG 24 hr tablet    Sig: Take 3 tablets (1,500 mg total) by mouth daily with supper.    Dispense:  270 tablet    Refill:  2  . DISCONTD: pantoprazole (PROTONIX) 40 MG tablet    Sig: Take 1 tablet (40 mg total) by mouth daily.    Dispense:  90 tablet    Refill:  2    Return in about 6 months (around 04/12/2021) for HTN/T2DM.    This visit occurred during the SARS-CoV-2 public health emergency.  Safety  protocols were in place, including screening questions prior to the visit, additional usage of staff PPE, and extensive cleaning of exam room while observing appropriate contact time as indicated for disinfecting solutions.

## 2020-10-13 NOTE — Assessment & Plan Note (Signed)
He is doing well with metformin and farxiga.  Update a1c

## 2020-10-13 NOTE — Assessment & Plan Note (Signed)
Recheck hepatic panel.

## 2020-10-13 NOTE — Assessment & Plan Note (Signed)
Pristiq continues to work well for him.   Continue at current strength.

## 2020-10-14 ENCOUNTER — Ambulatory Visit: Payer: No Typology Code available for payment source | Admitting: Family Medicine

## 2020-10-22 ENCOUNTER — Other Ambulatory Visit (HOSPITAL_COMMUNITY): Payer: Self-pay

## 2020-10-22 MED FILL — Dapagliflozin Propanediol Tab 5 MG (Base Equivalent): ORAL | 90 days supply | Qty: 90 | Fill #0 | Status: AC

## 2020-10-23 ENCOUNTER — Other Ambulatory Visit (HOSPITAL_COMMUNITY): Payer: Self-pay

## 2020-10-24 ENCOUNTER — Other Ambulatory Visit (HOSPITAL_COMMUNITY): Payer: Self-pay

## 2020-11-26 ENCOUNTER — Other Ambulatory Visit (HOSPITAL_COMMUNITY): Payer: Self-pay

## 2020-11-26 MED FILL — Metformin HCl Tab ER 24HR 500 MG: ORAL | 90 days supply | Qty: 270 | Fill #0 | Status: AC

## 2020-11-26 MED FILL — Tamsulosin HCl Cap 0.4 MG: ORAL | 90 days supply | Qty: 90 | Fill #0 | Status: AC

## 2020-12-03 ENCOUNTER — Other Ambulatory Visit (HOSPITAL_COMMUNITY): Payer: Self-pay

## 2020-12-03 MED FILL — Amlodipine Besylate-Benazepril HCl Cap 10-40 MG: ORAL | 90 days supply | Qty: 90 | Fill #0 | Status: AC

## 2020-12-05 ENCOUNTER — Encounter: Payer: Self-pay | Admitting: Family Medicine

## 2020-12-05 MED FILL — Bisoprolol & Hydrochlorothiazide Tab 5-6.25 MG: ORAL | 90 days supply | Qty: 90 | Fill #0 | Status: AC

## 2020-12-06 ENCOUNTER — Other Ambulatory Visit (HOSPITAL_COMMUNITY): Payer: Self-pay

## 2020-12-06 MED FILL — Desvenlafaxine Succinate Tab ER 24HR 100 MG (Base Equiv): ORAL | 90 days supply | Qty: 90 | Fill #0 | Status: AC

## 2020-12-19 ENCOUNTER — Other Ambulatory Visit: Payer: Self-pay | Admitting: Family Medicine

## 2020-12-19 ENCOUNTER — Other Ambulatory Visit (HOSPITAL_COMMUNITY): Payer: Self-pay

## 2020-12-19 MED ORDER — BISOPROLOL-HYDROCHLOROTHIAZIDE 5-6.25 MG PO TABS
1.0000 | ORAL_TABLET | Freq: Every day | ORAL | 1 refills | Status: DC
  Filled 2020-12-19 – 2021-03-25 (×2): qty 90, 90d supply, fill #0
  Filled 2021-07-08: qty 90, 90d supply, fill #1

## 2020-12-25 ENCOUNTER — Other Ambulatory Visit (HOSPITAL_COMMUNITY): Payer: Self-pay

## 2020-12-25 MED FILL — Bupropion HCl Tab ER 24HR 150 MG: ORAL | 90 days supply | Qty: 90 | Fill #0 | Status: CN

## 2020-12-25 MED FILL — Bupropion HCl Tab ER 24HR 150 MG: ORAL | 90 days supply | Qty: 90 | Fill #0 | Status: AC

## 2021-01-16 ENCOUNTER — Other Ambulatory Visit (HOSPITAL_COMMUNITY): Payer: Self-pay

## 2021-01-16 MED FILL — Levothyroxine Sodium Tab 175 MCG: ORAL | 90 days supply | Qty: 90 | Fill #0 | Status: AC

## 2021-01-17 ENCOUNTER — Other Ambulatory Visit (HOSPITAL_COMMUNITY): Payer: Self-pay

## 2021-01-28 ENCOUNTER — Other Ambulatory Visit (HOSPITAL_COMMUNITY): Payer: Self-pay

## 2021-01-28 MED FILL — Dapagliflozin Propanediol Tab 5 MG (Base Equivalent): ORAL | 90 days supply | Qty: 90 | Fill #1 | Status: AC

## 2021-01-28 MED FILL — Pantoprazole Sodium EC Tab 40 MG (Base Equiv): ORAL | 90 days supply | Qty: 90 | Fill #0 | Status: AC

## 2021-01-31 ENCOUNTER — Other Ambulatory Visit (HOSPITAL_COMMUNITY): Payer: Self-pay

## 2021-03-10 ENCOUNTER — Other Ambulatory Visit (HOSPITAL_COMMUNITY): Payer: Self-pay

## 2021-03-10 MED FILL — Amlodipine Besylate-Benazepril HCl Cap 10-40 MG: ORAL | 90 days supply | Qty: 90 | Fill #1 | Status: AC

## 2021-03-11 ENCOUNTER — Other Ambulatory Visit (HOSPITAL_COMMUNITY): Payer: Self-pay

## 2021-03-14 ENCOUNTER — Other Ambulatory Visit (HOSPITAL_COMMUNITY): Payer: Self-pay

## 2021-03-14 MED FILL — Tamsulosin HCl Cap 0.4 MG: ORAL | 90 days supply | Qty: 90 | Fill #0 | Status: AC

## 2021-03-14 MED FILL — Metformin HCl Tab ER 24HR 500 MG: ORAL | 90 days supply | Qty: 270 | Fill #0 | Status: AC

## 2021-03-14 MED FILL — Desvenlafaxine Succinate Tab ER 24HR 100 MG (Base Equiv): ORAL | 90 days supply | Qty: 90 | Fill #0 | Status: AC

## 2021-03-25 ENCOUNTER — Other Ambulatory Visit (HOSPITAL_COMMUNITY): Payer: Self-pay

## 2021-04-16 ENCOUNTER — Other Ambulatory Visit: Payer: Self-pay | Admitting: Family Medicine

## 2021-04-16 ENCOUNTER — Other Ambulatory Visit (HOSPITAL_COMMUNITY): Payer: Self-pay

## 2021-04-16 MED ORDER — BUPROPION HCL ER (XL) 150 MG PO TB24
ORAL_TABLET | Freq: Every day | ORAL | 0 refills | Status: DC
  Filled 2021-04-16: qty 90, 90d supply, fill #0

## 2021-04-22 ENCOUNTER — Other Ambulatory Visit: Payer: Self-pay | Admitting: Family Medicine

## 2021-04-23 ENCOUNTER — Ambulatory Visit (INDEPENDENT_AMBULATORY_CARE_PROVIDER_SITE_OTHER): Payer: No Typology Code available for payment source | Admitting: Family Medicine

## 2021-04-23 ENCOUNTER — Other Ambulatory Visit (HOSPITAL_COMMUNITY): Payer: Self-pay

## 2021-04-23 ENCOUNTER — Encounter: Payer: Self-pay | Admitting: Family Medicine

## 2021-04-23 ENCOUNTER — Other Ambulatory Visit: Payer: Self-pay

## 2021-04-23 VITALS — BP 131/84 | HR 81 | Temp 97.9°F | Wt 218.0 lb

## 2021-04-23 DIAGNOSIS — E119 Type 2 diabetes mellitus without complications: Secondary | ICD-10-CM

## 2021-04-23 DIAGNOSIS — H029 Unspecified disorder of eyelid: Secondary | ICD-10-CM

## 2021-04-23 DIAGNOSIS — E039 Hypothyroidism, unspecified: Secondary | ICD-10-CM

## 2021-04-23 DIAGNOSIS — I10 Essential (primary) hypertension: Secondary | ICD-10-CM

## 2021-04-23 DIAGNOSIS — R399 Unspecified symptoms and signs involving the genitourinary system: Secondary | ICD-10-CM

## 2021-04-23 DIAGNOSIS — Z23 Encounter for immunization: Secondary | ICD-10-CM | POA: Diagnosis not present

## 2021-04-23 DIAGNOSIS — F419 Anxiety disorder, unspecified: Secondary | ICD-10-CM

## 2021-04-23 HISTORY — DX: Unspecified disorder of eyelid: H02.9

## 2021-04-23 LAB — POCT GLYCOSYLATED HEMOGLOBIN (HGB A1C): HbA1c, POC (controlled diabetic range): 6.4 % (ref 0.0–7.0)

## 2021-04-23 MED ORDER — LEVOTHYROXINE SODIUM 175 MCG PO TABS
ORAL_TABLET | ORAL | 1 refills | Status: DC
  Filled 2021-04-23: qty 90, 90d supply, fill #0
  Filled 2021-07-30: qty 90, 90d supply, fill #1

## 2021-04-23 NOTE — Assessment & Plan Note (Signed)
Blood pressure remains well controlled with current medications.  He will continue current antihypertensive regimen.  Low-sodium diet encouraged.

## 2021-04-23 NOTE — Assessment & Plan Note (Signed)
Recheck TSH today.  

## 2021-04-23 NOTE — Assessment & Plan Note (Signed)
Stable symptoms with Pristiq.  Continue at current strength.

## 2021-04-23 NOTE — Progress Notes (Signed)
Mark Black - 55 y.o. male MRN [id number]  Date of birth: [birth year]  Subjective Chief Complaint  Patient presents with   Diabetes    HPI Mark Black is a 55 year old male here today for follow-up visit.  He reports overall he is doing well.  He is due for updated labs today.  He would like to have updated flu vaccine and shingles vaccine.  He continues on amlodipine/benazepril and bisoprolol hydrochlorothiazide for management of hypertension.  He is doing well with these and denies side effects.  He has not had chest pain, shortness of breath, palpitations, headache or vision changes.  Blood sugars have remained pretty well controlled with metformin and Farxiga.  No side effects noted with these.  He has noticed some drooping of his left eyelid over the past couple months.  He did make an appointment with an optometrist however did not go through with this due to wait.  Doing well with Pristiq for management of depressive symptoms.  ROS:  A comprehensive ROS was completed and negative except as noted per HPI  No Known Allergies  Past Medical History:  Diagnosis Date   Allergy    Alopecia totalis    Asthma    as a child   Chicken pox    Diabetes mellitus without complication ([place])    Hypertension    Thyroid disease    Vitiligo     Past Surgical History:  Procedure Laterality Date   CARPAL TUNNEL RELEASE     Bil   NASAL SINUS SURGERY  1994   cut and open air passages/nostrils    Social History   Socioeconomic History   Marital status: Married    Spouse name: Not on file   Number of children: Not on file   Years of education: Not on file   Highest education level: Not on file  Occupational History   Not on file  Tobacco Use   Smoking status: Never   Smokeless tobacco: Never  Vaping Use   Vaping Use: Never used  Substance and Sexual Activity   Alcohol use: Not Currently   Drug use: Not Currently   Sexual activity: Not on file  Other Topics Concern   Not on  file  Social History Narrative   Not on file   Social Determinants of Health   Financial Resource Strain: Not on file  Food Insecurity: Not on file  Transportation Needs: Not on file  Physical Activity: Not on file  Stress: Not on file  Social Connections: Not on file    Family History  Problem Relation Age of Onset   Diabetes Father    Hypertension Father    Diabetes Paternal Aunt    Cancer Maternal Aunt        breast   Diabetes Sister    Hypertension Sister    Diabetes Brother    Colon cancer Neg Hx    Colon polyps Neg Hx    Esophageal cancer Neg Hx    Stomach cancer Neg Hx    Rectal cancer Neg Hx     Health Maintenance  Topic Date Due   OPHTHALMOLOGY EXAM  Never done   HIV Screening  Never done   Hepatitis C Screening  Never done   COVID-19 Vaccine (3 - Booster for Pfizer series) 03/31/2020   Zoster Vaccines- Shingrix (2 of 2) 06/18/2021   HEMOGLOBIN A1C  10/22/2021   FOOT EXAM  04/23/2022   TETANUS/TDAP  01/27/2027   COLONOSCOPY (Pts 45-23yrs Insurance coverage will  need to be confirmed)  05/27/2028   INFLUENZA VACCINE  Completed   HPV VACCINES  Aged Out     ----------------------------------------------------------------------------------------------------------------------------------------------------------------------------------------------------------------- Physical Exam BP 131/84 (BP Location: Left Arm, Patient Position: Sitting, Cuff Size: Large)   Pulse 81   Temp 97.9 F (36.6 C)   Wt 218 lb (98.9 kg)   SpO2 96%   BMI 31.06 kg/m   Physical Exam Constitutional:      Appearance: Normal appearance.  HENT:     Head: Normocephalic and atraumatic.  Eyes:     General: No scleral icterus.    Comments: Mild edema and drooping on the left upper lid.  No tenderness or significant erythema.  Cardiovascular:     Rate and Rhythm: Normal rate and regular rhythm.  Musculoskeletal:     Cervical back: Neck supple.  Skin:    General: Skin is warm and  dry.  Neurological:     Mental Status: He is alert.  Psychiatric:        Mood and Affect: Mood normal.        Behavior: Behavior normal.    ------------------------------------------------------------------------------------------------------------------------------------------------------------------------------------------------------------------- Assessment and Plan  Essential hypertension Blood pressure remains well controlled with current medications.  He will continue current antihypertensive regimen.  Low-sodium diet encouraged.  T2DM (type 2 diabetes mellitus) ([place]) Most recent A1c of  Lab Results  Component Value Date   HGBA1C 6.4 04/23/2021   indicates diabetes is well controlled.  he  will continue current medications.  Counseled on healthy, low carb diet and recommend frequent activity to help with maintaining good control of blood sugars.    Hypothyroidism Recheck TSH today.  Eyelid abnormality Unclear etiology.  Referral placed ophthalmology.  Anxiety Stable symptoms with Pristiq.  Continue at current strength.   No orders of the defined types were placed in this encounter.   Return in about 6 months (around 10/22/2021) for HTN/DM.    This visit occurred during the SARS-CoV-2 public health emergency.  Safety protocols were in place, including screening questions prior to the visit, additional usage of staff PPE, and extensive cleaning of exam room while observing appropriate contact time as indicated for disinfecting solutions.

## 2021-04-23 NOTE — Patient Instructions (Signed)
Great to see you today! Continue current medications.  Return for labs in about 3 months.  You can have the second shingles shot as well.  See me again in 6 months.

## 2021-04-23 NOTE — Assessment & Plan Note (Signed)
Most recent A1c of  Lab Results  Component Value Date   HGBA1C 6.4 04/23/2021   indicates diabetes is well controlled.  he  will continue current medications.  Counseled on healthy, low carb diet and recommend frequent activity to help with maintaining good control of blood sugars.

## 2021-04-23 NOTE — Assessment & Plan Note (Signed)
Unclear etiology.  Referral placed ophthalmology.

## 2021-05-19 ENCOUNTER — Other Ambulatory Visit (HOSPITAL_COMMUNITY): Payer: Self-pay

## 2021-05-19 MED FILL — Pantoprazole Sodium EC Tab 40 MG (Base Equiv): ORAL | 90 days supply | Qty: 90 | Fill #0 | Status: AC

## 2021-05-20 LAB — HM DIABETES EYE EXAM

## 2021-05-26 ENCOUNTER — Other Ambulatory Visit (HOSPITAL_COMMUNITY): Payer: Self-pay

## 2021-05-26 MED FILL — Dapagliflozin Propanediol Tab 5 MG (Base Equivalent): ORAL | 90 days supply | Qty: 90 | Fill #0 | Status: AC

## 2021-05-28 ENCOUNTER — Other Ambulatory Visit (HOSPITAL_COMMUNITY): Payer: Self-pay

## 2021-05-29 ENCOUNTER — Encounter: Payer: Self-pay | Admitting: Neurology

## 2021-06-12 ENCOUNTER — Other Ambulatory Visit (HOSPITAL_COMMUNITY): Payer: Self-pay

## 2021-06-12 ENCOUNTER — Other Ambulatory Visit: Payer: Self-pay | Admitting: Family Medicine

## 2021-06-12 MED ORDER — AMLODIPINE BESY-BENAZEPRIL HCL 10-40 MG PO CAPS
1.0000 | ORAL_CAPSULE | Freq: Every day | ORAL | 1 refills | Status: DC
  Filled 2021-06-12: qty 90, 90d supply, fill #0
  Filled 2021-09-15: qty 90, 90d supply, fill #1

## 2021-06-12 MED FILL — Desvenlafaxine Succinate Tab ER 24HR 100 MG (Base Equiv): ORAL | 90 days supply | Qty: 90 | Fill #1 | Status: AC

## 2021-06-24 ENCOUNTER — Other Ambulatory Visit (HOSPITAL_COMMUNITY): Payer: Self-pay

## 2021-06-24 ENCOUNTER — Other Ambulatory Visit: Payer: Self-pay | Admitting: Family Medicine

## 2021-06-24 MED ORDER — TAMSULOSIN HCL 0.4 MG PO CAPS
0.4000 mg | ORAL_CAPSULE | Freq: Every day | ORAL | 3 refills | Status: DC
  Filled 2021-06-24: qty 90, 90d supply, fill #0

## 2021-06-27 MED FILL — Metformin HCl Tab ER 24HR 500 MG: ORAL | 90 days supply | Qty: 270 | Fill #1 | Status: AC

## 2021-06-28 ENCOUNTER — Other Ambulatory Visit (HOSPITAL_COMMUNITY): Payer: Self-pay

## 2021-07-08 ENCOUNTER — Other Ambulatory Visit (HOSPITAL_COMMUNITY): Payer: Self-pay

## 2021-07-10 ENCOUNTER — Encounter: Payer: Self-pay | Admitting: Family Medicine

## 2021-07-21 ENCOUNTER — Encounter: Payer: Self-pay | Admitting: Family Medicine

## 2021-07-21 NOTE — Telephone Encounter (Signed)
LVM for patient to call back to get appt rescheduled. AM

## 2021-07-24 ENCOUNTER — Ambulatory Visit: Payer: No Typology Code available for payment source

## 2021-07-30 ENCOUNTER — Other Ambulatory Visit (HOSPITAL_COMMUNITY): Payer: Self-pay

## 2021-07-30 ENCOUNTER — Other Ambulatory Visit: Payer: Self-pay | Admitting: Family Medicine

## 2021-07-30 MED ORDER — BUPROPION HCL ER (XL) 150 MG PO TB24
ORAL_TABLET | Freq: Every day | ORAL | 0 refills | Status: DC
  Filled 2021-07-30: qty 90, 90d supply, fill #0

## 2021-07-31 ENCOUNTER — Ambulatory Visit (INDEPENDENT_AMBULATORY_CARE_PROVIDER_SITE_OTHER): Payer: No Typology Code available for payment source | Admitting: Family Medicine

## 2021-07-31 ENCOUNTER — Other Ambulatory Visit: Payer: Self-pay

## 2021-07-31 VITALS — Temp 97.8°F

## 2021-07-31 DIAGNOSIS — Z23 Encounter for immunization: Secondary | ICD-10-CM

## 2021-07-31 NOTE — Progress Notes (Signed)
Established Patient Office Visit  Subjective:  Patient ID: Mark Black, male    DOB: April 09, 1966  Age: 56 y.o. MRN: [id number]  CC:  Chief Complaint  Patient presents with   Immunizations    HPI Mark Black presents for shingles vaccine and fasting labs.   Past Medical History:  Diagnosis Date   Allergy    Alopecia totalis    Asthma    as a child   Chicken pox    Diabetes mellitus without complication ([place])    Hypertension    Thyroid disease    Vitiligo     Past Surgical History:  Procedure Laterality Date   CARPAL TUNNEL RELEASE     Bil   NASAL SINUS SURGERY  1994   cut and open air passages/nostrils    Family History  Problem Relation Age of Onset   Diabetes Father    Hypertension Father    Diabetes Paternal Aunt    Cancer Maternal Aunt        breast   Diabetes Sister    Hypertension Sister    Diabetes Brother    Colon cancer Neg Hx    Colon polyps Neg Hx    Esophageal cancer Neg Hx    Stomach cancer Neg Hx    Rectal cancer Neg Hx     Social History   Socioeconomic History   Marital status: Married    Spouse name: Not on file   Number of children: Not on file   Years of education: Not on file   Highest education level: Not on file  Occupational History   Not on file  Tobacco Use   Smoking status: Never   Smokeless tobacco: Never  Vaping Use   Vaping Use: Never used  Substance and Sexual Activity   Alcohol use: Not Currently   Drug use: Not Currently   Sexual activity: Not on file  Other Topics Concern   Not on file  Social History Narrative   Not on file   Social Determinants of Health   Financial Resource Strain: Not on file  Food Insecurity: Not on file  Transportation Needs: Not on file  Physical Activity: Not on file  Stress: Not on file  Social Connections: Not on file  Intimate Partner Violence: Not on file    Outpatient Medications Prior to Visit  Medication Sig Dispense Refill   amLODipine-benazepril  (LOTREL) 10-40 MG capsule TAKE 1 CAPSULE BY MOUTH ONCE DAILY 90 capsule 1   bisoprolol-hydrochlorothiazide (ZIAC) 5-6.25 MG tablet TAKE 1 TABLET BY MOUTH DAILY. 90 tablet 1   Blood Glucose Monitoring Suppl (FREESTYLE LITE) DEVI Use as instructed to check blood sugar once a day at various times 100 each 3   buPROPion (WELLBUTRIN XL) 150 MG 24 hr tablet TAKE 1 TABLET (150 MG TOTAL) BY MOUTH DAILY. 90 tablet 0   dapagliflozin propanediol (FARXIGA) 5 MG TABS tablet Take 1 tablet (5 mg total) by mouth daily. 90 tablet 2   desvenlafaxine (PRISTIQ) 100 MG 24 hr tablet TAKE 1 TABLET BY MOUTH DAILY. 90 tablet 3   fluticasone (FLONASE) 50 MCG/ACT nasal spray Place 2 sprays into both nostrils daily. 16 g 6   glucose blood test strip Use as instructed 100 each 12   Lancets (FREESTYLE) lancets Use as instructed 100 each 12   levothyroxine (SYNTHROID) 175 MCG tablet TAKE 1 TABLET (175 MCG TOTAL) BY MOUTH DAILY. 90 tablet 1   metFORMIN (GLUCOPHAGE-XR) 500 MG 24 hr tablet TAKE 3 TABLETS (  1,500 MG TOTAL) BY MOUTH DAILY WITH SUPPER. 270 tablet 2   Multiple Vitamins-Minerals (MULTIVITAMIN ADULT PO) Take by mouth.     pantoprazole (PROTONIX) 40 MG tablet TAKE 1 TABLET (40 MG TOTAL) BY MOUTH DAILY. 90 tablet 2   sodium chloride (OCEAN) 0.65 % SOLN nasal spray Place 1 spray into both nostrils as needed. 60 mL 0   tamsulosin (FLOMAX) 0.4 MG CAPS capsule Take 1 capsule (0.4 mg total) by mouth daily. 90 capsule 3   triamcinolone cream (KENALOG) 0.1 % Apply 1 application topically 2 (two) times daily. 30 g 0   vitamin B-12 (CYANOCOBALAMIN) 1000 MCG tablet Take 1,000 mcg by mouth daily.     Facility-Administered Medications Prior to Visit  Medication Dose Route Frequency Provider Last Rate Last Admin   0.9 %  sodium chloride infusion  500 mL Intravenous Once Armbruster, Carlota Raspberry, MD        No Known Allergies  ROS Review of Systems    Objective:    Physical Exam  Temp 97.8 F (36.6 C) (Temporal)  Wt Readings  from Last 3 Encounters:  04/23/21 218 lb (98.9 kg)  10/11/20 217 lb 9.6 oz (98.7 kg)  08/14/20 216 lb (98 kg)     Health Maintenance Due  Topic Date Due   OPHTHALMOLOGY EXAM  Never done   HIV Screening  Never done   Hepatitis C Screening  Never done   Pneumococcal Vaccine 90-17 Years old (2 - PCV) 07/02/2019   COVID-19 Vaccine (3 - Booster for Pfizer series) 12/25/2019    There are no preventive care reminders to display for this patient.  Lab Results  Component Value Date   TSH 2.85 10/11/2020   Lab Results  Component Value Date   WBC 7.5 05/16/2020   HGB 16.7 05/16/2020   HCT 46.4 05/16/2020   MCV 91.0 05/16/2020   PLT 178 05/16/2020   Lab Results  Component Value Date   NA 138 05/16/2020   K 4.0 05/16/2020   CO2 25 05/16/2020   GLUCOSE 139 (H) 05/16/2020   BUN 13 05/16/2020   CREATININE 0.83 05/16/2020   BILITOT 0.6 10/11/2020   ALKPHOS 59 07/11/2019   AST 58 (H) 10/11/2020   ALT 61 (H) 10/11/2020   PROT 7.0 10/11/2020   ALBUMIN 4.4 07/11/2019   CALCIUM 9.7 05/16/2020   GFR 88.58 07/11/2019   Lab Results  Component Value Date   CHOL 91 05/16/2020   Lab Results  Component Value Date   HDL 45 05/16/2020   Lab Results  Component Value Date   LDLCALC 23 05/16/2020   Lab Results  Component Value Date   TRIG 150 (H) 05/16/2020   Lab Results  Component Value Date   CHOLHDL 2.0 05/16/2020   Lab Results  Component Value Date   HGBA1C 6.4 04/23/2021      Assessment & Plan:  Vaccine - Patient tolerated injection well without complications. Advised patient to wait until called for fasting labs.   Problem List Items Addressed This Visit   None Visit Diagnoses     Need for shingles vaccine    -  Primary   Relevant Orders   Varicella-zoster vaccine IM (Shingrix) (Completed)       No orders of the defined types were placed in this encounter.   Follow-up: No follow-ups on file.    Lavell Luster, [place]

## 2021-07-31 NOTE — Progress Notes (Signed)
Medical screening examination/treatment was performed by qualified clinical staff member and as supervising physician I was immediately available for consultation/collaboration. I have reviewed documentation and agree with assessment and plan.  Eleanora Guinyard, DO  

## 2021-08-01 ENCOUNTER — Other Ambulatory Visit: Payer: Self-pay | Admitting: Family Medicine

## 2021-08-01 ENCOUNTER — Other Ambulatory Visit (HOSPITAL_COMMUNITY): Payer: Self-pay

## 2021-08-01 DIAGNOSIS — E039 Hypothyroidism, unspecified: Secondary | ICD-10-CM

## 2021-08-01 LAB — CBC WITH DIFFERENTIAL/PLATELET
Absolute Monocytes: 449 cells/uL (ref 200–950)
Basophils Absolute: 7 cells/uL (ref 0–200)
Basophils Relative: 0.1 %
Eosinophils Absolute: 136 cells/uL (ref 15–500)
Eosinophils Relative: 2 %
HCT: 51.1 % — ABNORMAL HIGH (ref 38.5–50.0)
Hemoglobin: 17.6 g/dL — ABNORMAL HIGH (ref 13.2–17.1)
Lymphs Abs: 1822 cells/uL (ref 850–3900)
MCH: 30.3 pg (ref 27.0–33.0)
MCHC: 34.4 g/dL (ref 32.0–36.0)
MCV: 88.1 fL (ref 80.0–100.0)
MPV: 9 fL (ref 7.5–12.5)
Monocytes Relative: 6.6 %
Neutro Abs: 4386 cells/uL (ref 1500–7800)
Neutrophils Relative %: 64.5 %
Platelets: 183 10*3/uL (ref 140–400)
RBC: 5.8 10*6/uL (ref 4.20–5.80)
RDW: 12.8 % (ref 11.0–15.0)
Total Lymphocyte: 26.8 %
WBC: 6.8 10*3/uL (ref 3.8–10.8)

## 2021-08-01 LAB — COMPLETE METABOLIC PANEL WITH GFR
AG Ratio: 2.3 (calc) (ref 1.0–2.5)
ALT: 61 U/L — ABNORMAL HIGH (ref 9–46)
AST: 53 U/L — ABNORMAL HIGH (ref 10–35)
Albumin: 4.8 g/dL (ref 3.6–5.1)
Alkaline phosphatase (APISO): 63 U/L (ref 35–144)
BUN: 11 mg/dL (ref 7–25)
CO2: 31 mmol/L (ref 20–32)
Calcium: 9.5 mg/dL (ref 8.6–10.3)
Chloride: 101 mmol/L (ref 98–110)
Creat: 0.81 mg/dL (ref 0.70–1.30)
Globulin: 2.1 g/dL (calc) (ref 1.9–3.7)
Glucose, Bld: 188 mg/dL — ABNORMAL HIGH (ref 65–99)
Potassium: 4.2 mmol/L (ref 3.5–5.3)
Sodium: 139 mmol/L (ref 135–146)
Total Bilirubin: 0.8 mg/dL (ref 0.2–1.2)
Total Protein: 6.9 g/dL (ref 6.1–8.1)
eGFR: 103 mL/min/{1.73_m2} (ref >=60)

## 2021-08-01 LAB — LIPID PANEL W/REFLEX DIRECT LDL
Cholesterol: 97 mg/dL (ref <200)
HDL: 41 mg/dL (ref >=40)
LDL Cholesterol (Calc): 26 mg/dL (calc)
Non-HDL Cholesterol (Calc): 56 mg/dL (calc) (ref <130)
Total CHOL/HDL Ratio: 2.4 (calc) (ref <5.0)
Triglycerides: 240 mg/dL — ABNORMAL HIGH (ref <150)

## 2021-08-01 LAB — TSH: TSH: 10.62 mIU/L — ABNORMAL HIGH (ref 0.40–4.50)

## 2021-08-01 LAB — PSA: PSA: 0.36 ng/mL (ref <=4.00)

## 2021-08-01 MED ORDER — LEVOTHYROXINE SODIUM 200 MCG PO TABS
200.0000 ug | ORAL_TABLET | Freq: Every day | ORAL | 1 refills | Status: DC
  Filled 2021-08-01: qty 90, 90d supply, fill #0
  Filled 2021-10-28: qty 90, 90d supply, fill #1

## 2021-08-04 ENCOUNTER — Other Ambulatory Visit (HOSPITAL_COMMUNITY): Payer: Self-pay

## 2021-08-05 ENCOUNTER — Other Ambulatory Visit (HOSPITAL_COMMUNITY): Payer: Self-pay

## 2021-08-05 ENCOUNTER — Encounter: Payer: Self-pay | Admitting: Family Medicine

## 2021-08-05 DIAGNOSIS — I10 Essential (primary) hypertension: Secondary | ICD-10-CM

## 2021-08-13 ENCOUNTER — Ambulatory Visit (INDEPENDENT_AMBULATORY_CARE_PROVIDER_SITE_OTHER): Payer: No Typology Code available for payment source | Admitting: Neurology

## 2021-08-13 ENCOUNTER — Other Ambulatory Visit: Payer: Self-pay

## 2021-08-13 ENCOUNTER — Other Ambulatory Visit (INDEPENDENT_AMBULATORY_CARE_PROVIDER_SITE_OTHER): Payer: No Typology Code available for payment source

## 2021-08-13 ENCOUNTER — Encounter: Payer: Self-pay | Admitting: Neurology

## 2021-08-13 VITALS — BP 125/85 | HR 78 | Ht 70.25 in | Wt 214.0 lb

## 2021-08-13 DIAGNOSIS — H02402 Unspecified ptosis of left eyelid: Secondary | ICD-10-CM

## 2021-08-13 NOTE — Progress Notes (Signed)
[facility] Clinic Note - Initial Visit   Date: 08/13/21  Mark Black MRN: [id number] DOB: 08/01/1965   Dear Dr. Lucianne Black:  Thank you for your kind referral of Mark Black for consultation of left ptosis. Although his history is well known to you, please allow [place] to reiterate it for the purpose of our medical record. The patient was accompanied to the clinic by wife who also provides collateral information.     History of Present Illness: Mark Black is a 56 y.o. 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   Starting around the fall of 2022, he began having intermittent droopiness of the left eyelid.  It can occur several days of the week and when present, it will be constant all day.  Rest does not seem to make any improvement, as he can wake up with it.  No double vision, difficulty swallowing/talking, jaw fatigue, or limb weakness.  He was seen by his ophthalmologist who referred him for evaluation of myasthenia gravis.   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    Out-side paper records, electronic medical record, and images have been reviewed where available and summarized as:  Lab Results  Component Value Date   HGBA1C 6.4 04/23/2021   No results found for: [id number] Lab Results  Component Value Date   TSH 10.62 (H) 07/31/2021   No results found for: ESRSEDRATE, POCTSEDRATE  Past Medical History:  Diagnosis Date   Allergy    Alopecia totalis    Asthma    as a child   Chicken pox    Diabetes mellitus without complication ([place])    Hypertension    Thyroid disease    Vitiligo     Past Surgical History:  Procedure Laterality Date   CARPAL TUNNEL RELEASE     Bil   NASAL SINUS SURGERY  1994   cut and open air passages/nostrils     Medications:  Outpatient  Encounter Medications as of 08/13/2021  Medication Sig   amLODipine-benazepril (LOTREL) 10-40 MG capsule TAKE 1 CAPSULE BY MOUTH ONCE DAILY   bisoprolol-hydrochlorothiazide (ZIAC) 5-6.25 MG tablet TAKE 1 TABLET BY MOUTH DAILY.   Blood Glucose Monitoring Suppl (FREESTYLE LITE) DEVI Use as instructed to check blood sugar once a day at various times   buPROPion (WELLBUTRIN XL) 150 MG 24 hr tablet TAKE 1 TABLET (150 MG TOTAL) BY MOUTH DAILY.   dapagliflozin propanediol (FARXIGA) 5 MG TABS tablet Take 1 tablet (5 mg total) by mouth daily.   desvenlafaxine (PRISTIQ) 100 MG 24 hr tablet TAKE 1 TABLET BY MOUTH DAILY.   glucose blood test strip Use as instructed   Lancets (FREESTYLE) lancets Use as instructed   levothyroxine (SYNTHROID) 200 MCG tablet Take 1 tablet (200 mcg total) by mouth daily before breakfast.   metFORMIN (GLUCOPHAGE-XR) 500 MG 24 hr tablet TAKE 3 TABLETS (1,500 MG TOTAL) BY MOUTH DAILY WITH SUPPER.   Multiple Vitamins-Minerals (MULTIVITAMIN ADULT PO) Take by mouth.   pantoprazole (PROTONIX) 40 MG tablet TAKE 1 TABLET (40 MG TOTAL) BY MOUTH DAILY.   triamcinolone cream (KENALOG) 0.1 % Apply 1 application topically 2 (two) times daily.   vitamin B-12 (CYANOCOBALAMIN) 1000 MCG tablet Take 1,000 mcg by mouth daily.   [DISCONTINUED] fluticasone (FLONASE) 50 MCG/ACT nasal spray Place 2 sprays into both nostrils daily. (Patient not taking: Reported on 08/13/2021)   [DISCONTINUED]  sodium chloride (OCEAN) 0.65 % SOLN nasal spray Place 1 spray into both nostrils as needed. (Patient not taking: Reported on 08/13/2021)   [DISCONTINUED] tamsulosin (FLOMAX) 0.4 MG CAPS capsule Take 1 capsule (0.4 mg total) by mouth daily. (Patient not taking: Reported on 08/13/2021)   Facility-Administered Encounter Medications as of 08/13/2021  Medication   0.9 %  sodium chloride infusion    Allergies: No Known Allergies  Family History: Family History  Problem Relation Age of Onset   Diabetes Father     Hypertension Father    Diabetes Paternal Aunt    Cancer Maternal Aunt        breast   Diabetes Sister    Hypertension Sister    Diabetes Brother    Colon cancer Neg Hx    Colon polyps Neg Hx    Esophageal cancer Neg Hx    Stomach cancer Neg Hx    Rectal cancer Neg Hx     Social History: Social History   Tobacco Use   Smoking status: Never   Smokeless tobacco: Never  Vaping Use   Vaping Use: Never used  Substance Use Topics   Alcohol use: Not Currently   Drug use: Not Currently   Social History   Social History Narrative   Right Handed   Lives in a one story home       Father passed away in his 39's from drowning     Vital Signs:  BP 125/85    Pulse 78    Ht 5' 10.25" (1.784 m)    Wt 214 lb (97.1 kg)    SpO2 97%    BMI 30.49 kg/m    Neurological Exam: MENTAL STATUS including orientation to time, place, person, recent and remote memory, attention span and concentration, language, and fund of knowledge is normal.  Speech is not dysarthric.  CRANIAL NERVES: II:  No visual field defects on the right eye, left eye is blind   III-IV-VI: Pupils equal round and reactive to light.  Normal conjugate, extra-ocular eye movements in all directions of gaze.  No nystagmus.  Left eye with moderate ptosis, eyelid sitting about 9mm below superior rim of the pupil.  Equivocal ice pack test.  V:  Normal facial sensation.    VII:  Normal facial symmetry and movements.  Facial muscles are 5/5 throughout, except trace 5-/5 weakness of the left orbicularis oculi VIII:  Normal hearing and vestibular function.   IX-X:  Normal palatal movement.   XI:  Normal shoulder shrug and head rotation.   XII:  Normal tongue strength and range of motion, no deviation or fasciculation.  MOTOR:  Motor strength is 5/5 throughout, no fatigability. No atrophy, fasciculations or abnormal movements.  No pronator drift.   MSRs:  Right        Left                  brachioradialis 2+  2+  biceps 2+  2+   triceps 2+  2+  patellar 2+  2+  ankle jerk 2+  2+  Hoffman no  no  plantar response down  down   SENSORY:  Normal and symmetric perception of vibration and temperature.    COORDINATION/GAIT: Normal finger-to- nose-finger.  Intact rapid alternating movements bilaterally.  Gait narrow based and stable. Tandem and stressed gait intact.    IMPRESSION: Intermittent left ptosis. Exam shows isolated moderate left ptosis and trace weakness of the left orbicularis oculi. No other associated bulbar or limb weakness.  Ice pack test was equivocal. I will proceed with testing for ocular myasthenia.   - Check AChR antibodies and MUSK antibodies  - If antibodies are negative, next step will be single finger EMG  Further recommendations pending results.    Thank you for allowing me to participate in patient's care.  If I can answer any additional questions, I would be pleased to do so.    Sincerely,    Ikia Cincotta K. Posey Pronto, DO

## 2021-08-13 NOTE — Patient Instructions (Signed)
Check labs  If your labs are normal, then next step will be single fiber EMG at [facility]

## 2021-08-23 LAB — MYASTHENIA GRAVIS PANEL 2
A CHR BINDING ABS: <0.3 nmol/L
ACHR Blocking Abs: 40 % Inhibition — ABNORMAL HIGH (ref <15)
Acetylchol Modul Ab: 79 % Inhibition — ABNORMAL HIGH

## 2021-08-24 ENCOUNTER — Other Ambulatory Visit: Payer: Self-pay | Admitting: Family Medicine

## 2021-08-25 ENCOUNTER — Other Ambulatory Visit (HOSPITAL_COMMUNITY): Payer: Self-pay

## 2021-08-25 MED ORDER — PANTOPRAZOLE SODIUM 40 MG PO TBEC
DELAYED_RELEASE_TABLET | Freq: Every day | ORAL | 2 refills | Status: DC
  Filled 2021-08-25: qty 90, 90d supply, fill #0
  Filled 2021-11-24: qty 90, 90d supply, fill #1
  Filled 2022-03-07: qty 90, 90d supply, fill #2

## 2021-08-27 ENCOUNTER — Encounter: Payer: Self-pay | Admitting: Neurology

## 2021-08-27 ENCOUNTER — Ambulatory Visit (INDEPENDENT_AMBULATORY_CARE_PROVIDER_SITE_OTHER): Payer: No Typology Code available for payment source | Admitting: Neurology

## 2021-08-27 ENCOUNTER — Other Ambulatory Visit: Payer: Self-pay

## 2021-08-27 ENCOUNTER — Other Ambulatory Visit (HOSPITAL_COMMUNITY): Payer: Self-pay

## 2021-08-27 VITALS — BP 132/89 | HR 79 | Ht 70.25 in | Wt 213.0 lb

## 2021-08-27 DIAGNOSIS — G7001 Myasthenia gravis with (acute) exacerbation: Secondary | ICD-10-CM | POA: Diagnosis not present

## 2021-08-27 MED ORDER — PYRIDOSTIGMINE BROMIDE 60 MG PO TABS
60.0000 mg | ORAL_TABLET | Freq: Three times a day (TID) | ORAL | 11 refills | Status: DC
  Filled 2021-08-27: qty 90, 30d supply, fill #0

## 2021-08-27 NOTE — Progress Notes (Signed)
Follow-up Visit   Date: 08/27/21   Courtez Twaddle MRN: [id number] DOB: 25-Feb-1966   Interim History: Goodwin Kamphaus is a 56 y.o. 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  The patient was accompanied to the clinic by wife who also provides collateral information.    History of present illness: Starting around the fall of 2022, he began having intermittent droopiness of the left eyelid.  It can occur several days of the week and when present, it will be constant all day.  Rest does not seem to make any improvement, as he can wake up with it.  No double vision, difficulty swallowing/talking, jaw fatigue, or limb weakness.  He was seen by his ophthalmologist who referred him for evaluation of myasthenia gravis.    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    UPDATE 08/27/2021:  He is here to discuss the results of his myasthenia panel which was positive for AChR antibody (blocking and modulating).  He continues to have intermittent left ptosis.  No double vision, difficulty swallowing/talking, or limb weakness.    Medications:  Current Outpatient Medications on File Prior to Visit  Medication Sig Dispense Refill   amLODipine-benazepril (LOTREL) 10-40 MG capsule TAKE 1 CAPSULE BY MOUTH ONCE DAILY 90 capsule 1   bisoprolol-hydrochlorothiazide (ZIAC) 5-6.25 MG tablet TAKE 1 TABLET BY MOUTH DAILY. 90 tablet 1   Blood Glucose Monitoring Suppl (FREESTYLE LITE) DEVI Use as instructed to check blood sugar once a day at various times 100 each 3   buPROPion (WELLBUTRIN XL) 150 MG 24 hr tablet TAKE 1 TABLET (150 MG TOTAL) BY MOUTH DAILY. 90 tablet 0   dapagliflozin propanediol (FARXIGA) 5 MG TABS tablet Take 1 tablet (5 mg total) by mouth daily. 90 tablet 2   desvenlafaxine (PRISTIQ) 100  MG 24 hr tablet TAKE 1 TABLET BY MOUTH DAILY. 90 tablet 3   glucose blood test strip Use as instructed 100 each 12   Lancets (FREESTYLE) lancets Use as instructed 100 each 12   levothyroxine (SYNTHROID) 200 MCG tablet Take 1 tablet (200 mcg total) by mouth daily before breakfast. 90 tablet 1   metFORMIN (GLUCOPHAGE-XR) 500 MG 24 hr tablet TAKE 3 TABLETS (1,500 MG TOTAL) BY MOUTH DAILY WITH SUPPER. 270 tablet 2   Multiple Vitamins-Minerals (MULTIVITAMIN ADULT PO) Take by mouth.     pantoprazole (PROTONIX) 40 MG tablet TAKE 1 TABLET (40 MG TOTAL) BY MOUTH DAILY. 90 tablet 2   triamcinolone cream (KENALOG) 0.1 % Apply 1 application topically 2 (two) times daily. 30 g 0   vitamin B-12 (CYANOCOBALAMIN) 1000 MCG tablet Take 1,000 mcg by mouth daily.     Current Facility-Administered Medications on File Prior to Visit  Medication Dose Route Frequency Provider Last Rate Last Admin   0.9 %  sodium chloride infusion  500 mL Intravenous Once Armbruster, Carlota Raspberry, MD        Allergies: No Known Allergies  Vital Signs:  BP 132/89    Pulse 79    Ht 5' 10.25" (1.784 m)    Wt 213 lb (96.6 kg)    SpO2 95%    BMI 30.35 kg/m    Neurological Exam: MENTAL STATUS including orientation to time, place, person, recent and remote memory, attention span and concentration, language, and fund of knowledge is normal.  Speech is not dysarthric.  CRANIAL  NERVES:   Left eye is blind.  Normal conjugate, extra-ocular eye movements in all directions of gaze.  Mild-moderate left ptosis with eyelid sitting at the superior margin of the pupil, mild worsening with sustained upgaze.  Face is symmetric. Palate elevates symmetrically.  Tongue is midline.  Orbicularis oculi and buccinator is 5/5.  MOTOR:  Motor strength is 5/5 in all extremities, without fatigability.  No atrophy, fasciculations or abnormal movements.  No pronator drift.  Tone is normal.    COORDINATION/GAIT:  Normal finger-to- nose-finger.  Intact rapid  alternating movements bilaterally.  Gait narrow based and stable.   Data: Myasthenia gravis panel 08/13/2021:  AChR blocking 40*, modulating 79*  Lab Results  Component Value Date   HGBA1C 6.4 04/23/2021    IMPRESSION/PLAN: Seropositive myasthenia gravis, isolated left ptosis x 6 months.  He does not have double vision, bulbar weakness, or limb weakness.  Clinically, symptoms are mild with only left ptosis.    I had an extensive discussion with the patient regarding the diagnosis, pathogenesis, prognosis, management plan, meds to avoid, and potential exacerbation of myasthenia gravis.  He was also provided with a list of medications that can worsen MG and that he should avoid.  Regarding management, given mild ocular symptoms I favor using mestinon. I would use prednisone only if he develops double vision or more generalized symptoms.   PLAN/RECOMMENDATIONS:  CT chest with contrast Start mestinon 60mg  2-3 times per day.  Do not take at bedtime Patient to provide update in 2-3 weeks  Return to clinic in 6 months.   Total time spent reviewing records, interview, history/exam, documentation, counseling, and coordination of care on day of encounter:  40 min    Thank you for allowing me to participate in patient's care.  If I can answer any additional questions, I would be pleased to do so.    Sincerely,    Rolfe Hartsell K. Posey Pronto, DO

## 2021-08-27 NOTE — Patient Instructions (Addendum)
Start mestinon 60mg  three times daily  CT chest with contrast  You can visit [email or web address] for more information  Information on medications to use with caution provided  If you develop worsening weakness, double vision, difficulty swallowing/talking please contact my office  Return to clinic in September

## 2021-08-28 ENCOUNTER — Other Ambulatory Visit (HOSPITAL_COMMUNITY): Payer: Self-pay

## 2021-08-29 ENCOUNTER — Other Ambulatory Visit (HOSPITAL_COMMUNITY): Payer: Self-pay

## 2021-08-29 ENCOUNTER — Other Ambulatory Visit: Payer: Self-pay

## 2021-08-29 ENCOUNTER — Encounter: Payer: Self-pay | Admitting: Family Medicine

## 2021-08-29 MED ORDER — DAPAGLIFLOZIN PROPANEDIOL 5 MG PO TABS
5.0000 mg | ORAL_TABLET | Freq: Every day | ORAL | 2 refills | Status: DC
  Filled 2021-08-29: qty 90, 90d supply, fill #0
  Filled 2021-12-08: qty 90, 90d supply, fill #1
  Filled 2022-03-07: qty 90, 90d supply, fill #2

## 2021-09-01 ENCOUNTER — Other Ambulatory Visit (HOSPITAL_COMMUNITY): Payer: Self-pay

## 2021-09-11 ENCOUNTER — Ambulatory Visit
Admission: RE | Admit: 2021-09-11 | Discharge: 2021-09-11 | Disposition: A | Discharge status: Home / Self Care | Payer: No Typology Code available for payment source | Source: Ambulatory Visit | Attending: Neurology | Admitting: Neurology

## 2021-09-11 DIAGNOSIS — G7001 Myasthenia gravis with (acute) exacerbation: Secondary | ICD-10-CM

## 2021-09-11 MED ORDER — IOPAMIDOL (ISOVUE-300) INJECTION 61%
75.0000 mL | Freq: Once | INTRAVENOUS | Status: AC | PRN
  Administered 2021-09-11: 75 mL via INTRAVENOUS

## 2021-09-15 ENCOUNTER — Other Ambulatory Visit (HOSPITAL_COMMUNITY): Payer: Self-pay

## 2021-09-15 MED FILL — Desvenlafaxine Succinate Tab ER 24HR 100 MG (Base Equiv): ORAL | 90 days supply | Qty: 90 | Fill #2 | Status: AC

## 2021-09-16 ENCOUNTER — Telehealth: Payer: Self-pay | Admitting: Neurology

## 2021-09-16 NOTE — Telephone Encounter (Signed)
Pt called and left a message with the access nurse. He was returning a call about results.  ?

## 2021-09-16 NOTE — Telephone Encounter (Signed)
Returned patients call. See result note ?

## 2021-09-19 NOTE — Telephone Encounter (Signed)
Patient notifies via [place] of CT results.  ?

## 2021-09-29 ENCOUNTER — Other Ambulatory Visit: Payer: Self-pay | Admitting: Family Medicine

## 2021-09-29 ENCOUNTER — Other Ambulatory Visit (HOSPITAL_COMMUNITY): Payer: Self-pay

## 2021-09-29 MED ORDER — METFORMIN HCL ER 500 MG PO TB24
ORAL_TABLET | ORAL | 2 refills | Status: DC
  Filled 2021-09-29: qty 270, 90d supply, fill #0
  Filled 2022-01-06: qty 270, 90d supply, fill #1
  Filled 2022-04-14: qty 270, 90d supply, fill #2

## 2021-09-29 MED ORDER — TAMSULOSIN HCL 0.4 MG PO CAPS
0.4000 mg | ORAL_CAPSULE | Freq: Every day | ORAL | 3 refills | Status: DC
  Filled 2021-09-29: qty 90, 90d supply, fill #0
  Filled 2022-01-06: qty 90, 90d supply, fill #1
  Filled 2022-04-24: qty 90, 90d supply, fill #2
  Filled 2022-07-22: qty 90, 90d supply, fill #3

## 2021-10-01 ENCOUNTER — Other Ambulatory Visit (HOSPITAL_COMMUNITY): Payer: Self-pay

## 2021-10-08 ENCOUNTER — Ambulatory Visit: Payer: No Typology Code available for payment source | Admitting: Cardiology

## 2021-10-13 ENCOUNTER — Other Ambulatory Visit: Payer: Self-pay | Admitting: Family Medicine

## 2021-10-13 ENCOUNTER — Other Ambulatory Visit (HOSPITAL_COMMUNITY): Payer: Self-pay

## 2021-10-13 MED ORDER — BISOPROLOL-HYDROCHLOROTHIAZIDE 5-6.25 MG PO TABS
1.0000 | ORAL_TABLET | Freq: Every day | ORAL | 1 refills | Status: DC
  Filled 2021-10-13: qty 90, 90d supply, fill #0
  Filled 2022-01-22: qty 90, 90d supply, fill #1

## 2021-10-16 ENCOUNTER — Encounter: Payer: Self-pay | Admitting: Family Medicine

## 2021-10-22 ENCOUNTER — Encounter: Payer: Self-pay | Admitting: Cardiology

## 2021-10-22 LAB — TSH: TSH: 1.54 mIU/L (ref 0.40–4.50)

## 2021-10-22 NOTE — Telephone Encounter (Signed)
Pt notified that his wife may come with him to the visit.  ?

## 2021-10-23 ENCOUNTER — Other Ambulatory Visit (HOSPITAL_COMMUNITY): Payer: Self-pay

## 2021-10-23 ENCOUNTER — Encounter: Payer: Self-pay | Admitting: Family Medicine

## 2021-10-23 ENCOUNTER — Ambulatory Visit (INDEPENDENT_AMBULATORY_CARE_PROVIDER_SITE_OTHER): Payer: No Typology Code available for payment source | Admitting: Family Medicine

## 2021-10-23 ENCOUNTER — Other Ambulatory Visit: Payer: Self-pay

## 2021-10-23 VITALS — BP 112/72 | Ht 70.6 in | Wt 216.0 lb

## 2021-10-23 DIAGNOSIS — L630 Alopecia (capitis) totalis: Secondary | ICD-10-CM | POA: Insufficient documentation

## 2021-10-23 DIAGNOSIS — E039 Hypothyroidism, unspecified: Secondary | ICD-10-CM

## 2021-10-23 DIAGNOSIS — J45909 Unspecified asthma, uncomplicated: Secondary | ICD-10-CM | POA: Insufficient documentation

## 2021-10-23 DIAGNOSIS — I10 Essential (primary) hypertension: Secondary | ICD-10-CM

## 2021-10-23 DIAGNOSIS — B019 Varicella without complication: Secondary | ICD-10-CM | POA: Insufficient documentation

## 2021-10-23 DIAGNOSIS — E119 Type 2 diabetes mellitus without complications: Secondary | ICD-10-CM

## 2021-10-23 DIAGNOSIS — L80 Vitiligo: Secondary | ICD-10-CM | POA: Insufficient documentation

## 2021-10-23 DIAGNOSIS — T7840XA Allergy, unspecified, initial encounter: Secondary | ICD-10-CM | POA: Insufficient documentation

## 2021-10-23 DIAGNOSIS — F419 Anxiety disorder, unspecified: Secondary | ICD-10-CM | POA: Diagnosis not present

## 2021-10-23 LAB — POCT GLYCOSYLATED HEMOGLOBIN (HGB A1C): HbA1c, POC (controlled diabetic range): 6.7 % (ref 0.0–7.0)

## 2021-10-23 MED ORDER — OZEMPIC (0.25 OR 0.5 MG/DOSE) 2 MG/3ML ~~LOC~~ SOPN
PEN_INJECTOR | SUBCUTANEOUS | 3 refills | Status: DC
  Filled 2021-10-23: qty 3, 56d supply, fill #0

## 2021-10-23 NOTE — Assessment & Plan Note (Signed)
He has remained on 175 mcg dose of levothyroxine.  Recent TSH within normal limits.  We will continue at 175 mcg. ?

## 2021-10-23 NOTE — Progress Notes (Signed)
?Mark Black - 56 y.o. male MRN [id number]  Date of birth: [birth year] ? ?Subjective ?No chief complaint on file. ? ? ?HPI ?Mark Black is a 56 year old male here today for follow-up visit.  He is accompanied by his wife at today's visit.  He reports he is doing well at this time.  His TSH was elevated at last visit however he did not make adjustments to his levothyroxine from 175 to 200 mcg as instructed because he reports he had missed a few doses of his levothyroxine prior to having labs drawn.  His most recent TSH was within normal limits at the 175 mcg dose. ? ?He is rarely checking blood sugars at home.  Glucoses been fairly well controlled with current medications.  He has wife have questions regarding Ozempic.  He may be interested in trying this in addition to his metformin and [place]. ? ?Blood pressure is stable with Ziac and Lotrel.  No side effects from medications.  Denies chest pain, shortness of breath, palpitations, headaches or vision changes ? ?Mood remains well controlled with combination of Pristiq and bupropion. ? ?ROS:  A comprehensive ROS was completed and negative except as noted per HPI ?Future we can review ? ?Past Medical History:  ?Diagnosis Date  ? Allergic rhinitis 12/24/2017  ? Allergy   ? Alopecia totalis   ? Anxiety 12/24/2017  ? Asthma   ? as a child  ? Chicken pox   ? Diabetes mellitus without complication ([place])   ? Elevated liver enzymes 10/13/2020  ? Essential hypertension 02/10/2013  ? Eyelid abnormality 04/23/2021  ? GERD (gastroesophageal reflux disease) 01/27/2019  ? Hypogonadism in male 06/26/2019  ? Hypothyroidism 02/10/2013  ? Lower urinary tract symptoms (LUTS) 12/24/2017  ? Screening for colon cancer 12/24/2017  ? Suspected COVID-19 virus infection 06/20/2020  ? T2DM (type 2 diabetes mellitus) ([place]) 03/21/2018  ? Vitiligo   ? ? ?Past Surgical History:  ?Procedure Laterality Date  ? CARPAL TUNNEL RELEASE    ? Bil  ? NASAL SINUS SURGERY  1994  ? cut and open air passages/nostrils   ? ? ?Social History  ? ?Socioeconomic History  ? Marital status: Married  ?  Spouse name: Not on file  ? Number of children: 2  ? Years of education: Not on file  ? Highest education level: Not on file  ?Occupational History  ? Not on file  ?Tobacco Use  ? Smoking status: Never  ? Smokeless tobacco: Never  ?Vaping Use  ? Vaping Use: Never used  ?Substance and Sexual Activity  ? Alcohol use: Not Currently  ? Drug use: Not Currently  ? Sexual activity: Not on file  ?Other Topics Concern  ? Not on file  ?Social History Narrative  ? Right Handed  ? Lives in a one story home   ?   ? Father passed away in his 37's from drowning   ? ?Social Determinants of Health  ? ?Financial Resource Strain: Not on file  ?Food Insecurity: Not on file  ?Transportation Needs: Not on file  ?Physical Activity: Not on file  ?Stress: Not on file  ?Social Connections: Not on file  ? ? ?Family History  ?Problem Relation Age of Onset  ? Diabetes Father   ? Hypertension Father   ? Diabetes Paternal Aunt   ? Cancer Maternal Aunt   ?     breast  ? Diabetes Sister   ? Hypertension Sister   ? Diabetes Brother   ? Colon cancer Neg Hx   ?  Colon polyps Neg Hx   ? Esophageal cancer Neg Hx   ? Stomach cancer Neg Hx   ? Rectal cancer Neg Hx   ? ? ?Health Maintenance  ?Topic Date Due  ? COVID-19 Vaccine (3 - Booster for Pfizer series) 11/08/2021 (Originally 12/25/2019)  ? Hepatitis C Screening  10/24/2022 (Originally 01/11/1982)  ? HIV Screening  10/24/2022 (Originally 01/12/1979)  ? INFLUENZA VACCINE  02/10/2022  ? FOOT EXAM  04/23/2022  ? HEMOGLOBIN A1C  04/24/2022  ? OPHTHALMOLOGY EXAM  05/20/2022  ? TETANUS/TDAP  01/27/2027  ? COLONOSCOPY (Pts 45-51yr Insurance coverage will need to be confirmed)  05/27/2028  ? Zoster Vaccines- Shingrix  Completed  ? HPV VACCINES  Aged Out   ? ? ? ?----------------------------------------------------------------------------------------------------------------------------------------------------------------------------------------------------------------- ?Physical Exam ?There were no vitals taken for this visit. ? ?Physical Exam ?Constitutional:   ?   Appearance: Normal appearance.  ?Eyes:  ?   General: No scleral icterus. ?Cardiovascular:  ?   Rate and Rhythm: Normal rate and regular rhythm.  ?Pulmonary:  ?   Effort: Pulmonary effort is normal.  ?   Breath sounds: Normal breath sounds.  ?Musculoskeletal:  ?   Cervical back: Neck supple.  ?Neurological:  ?   General: No focal deficit present.  ?   Mental Status: He is alert.  ?Psychiatric:     ?   Mood and Affect: Mood normal.     ?   Behavior: Behavior normal.  ? ? ?------------------------------------------------------------------------------------------------------------------------------------------------------------------------------------------------------------------- ?Assessment and Plan ? ?Essential hypertension ?Blood pressure remains well controlled with current antihypertensive regimen.  Recommend continuation of current strength. ? ?Diabetes mellitus without complication ([place] ?A1c has slightly worsened since last visit.  We discussed adding Ozempic.  Side effects reviewed.  He like to try this help with blood sugars as well as weight management.  Starting at 0.25 mg x 4 weeks with increase to 0.5 mg thereafter. ? ?Hypothyroidism ?He has remained on 175 mcg dose of levothyroxine.  Recent TSH within normal limits.  We will continue at 175 mcg. ? ?Anxiety ?He continues to do well with combination of Pristiq and bupropion. ? ? ?Meds ordered this encounter  ?Medications  ? Semaglutide,0.25 or 0.'5MG'$ /DOS, (OZEMPIC, 0.25 OR 0.5 MG/DOSE,) 2 MG/3ML SOPN  ?  Sig: Inject 0.'25mg'$  into the skin once weekly for 4 weeks then increase to 0.'5mg'$  weekly  ?  Dispense:  3 mL  ?  Refill:  3  ? ? ?Return in about  6 months (around 04/24/2022) for T2DM. ? ? ? ?This visit occurred during the SARS-CoV-2 public health emergency.  Safety protocols were in place, including screening questions prior to the visit, additional usage of staff PPE, and extensive cleaning of exam room while observing appropriate contact time as indicated for disinfecting solutions.  ? ?

## 2021-10-23 NOTE — Assessment & Plan Note (Signed)
He continues to do well with combination of Pristiq and bupropion. ?

## 2021-10-23 NOTE — Assessment & Plan Note (Signed)
Blood pressure remains well controlled with current antihypertensive regimen.  Recommend continuation of current strength. ?

## 2021-10-23 NOTE — Patient Instructions (Signed)
Great to see you today! ?Let's add ozempic 0.'25mg'$  x1 month then increase to 0.'5mg'$  ?Follow up in 6 months or sooner if needed.  ?

## 2021-10-23 NOTE — Assessment & Plan Note (Signed)
A1c has slightly worsened since last visit.  We discussed adding Ozempic.  Side effects reviewed.  He like to try this help with blood sugars as well as weight management.  Starting at 0.25 mg x 4 weeks with increase to 0.5 mg thereafter. ?

## 2021-10-24 ENCOUNTER — Encounter: Payer: Self-pay | Admitting: Cardiology

## 2021-10-24 ENCOUNTER — Ambulatory Visit (INDEPENDENT_AMBULATORY_CARE_PROVIDER_SITE_OTHER): Payer: No Typology Code available for payment source | Admitting: Cardiology

## 2021-10-24 VITALS — BP 112/72 | HR 80 | Ht 70.6 in | Wt 216.1 lb

## 2021-10-24 DIAGNOSIS — I10 Essential (primary) hypertension: Secondary | ICD-10-CM | POA: Diagnosis not present

## 2021-10-24 DIAGNOSIS — E119 Type 2 diabetes mellitus without complications: Secondary | ICD-10-CM

## 2021-10-24 DIAGNOSIS — R011 Cardiac murmur, unspecified: Secondary | ICD-10-CM | POA: Diagnosis not present

## 2021-10-24 DIAGNOSIS — E781 Pure hyperglyceridemia: Secondary | ICD-10-CM | POA: Diagnosis not present

## 2021-10-24 MED ORDER — FISH OIL 1000 MG PO CAPS: 2.0000 | ORAL_CAPSULE | Freq: Two times a day (BID) | ORAL | 12 refills | Status: DC

## 2021-10-24 NOTE — Progress Notes (Signed)
?Cardiology Office Note:   ? ?Date:  10/24/2021  ? ?ID:  Mark Black, DOB 1966/01/08, MRN [id number] ? ?PCP:  Mark Nutting, DO  ?Cardiologist:  Mark Lindau, MD  ? ?Referring MD: Mark Nutting, DO  ? ? ?ASSESSMENT:   ? ?1. Murmur, cardiac   ?2. Essential hypertension   ?3. Diabetes mellitus without complication ([place])   ?4. Hypertriglyceridemia   ? ?PLAN:   ? ?In order of problems listed above: ? ?Primary prevention stressed with the patient.  Importance of compliance with diet and medication stressed and vocalized understanding. ?Essential hypertension: Blood pressure stable and diet was emphasized.  Lifestyle modification urged. ?Mixed dyslipidemia with hypertriglyceridemia.  Lipids were reviewed.  LDL is markedly low.  Unclear etiology.  His triglycerides are elevated and I recommended fish oil 2 g twice daily.  Diet was emphasized.  He was also advised to walk at least 2 miles a day 5 days a week and he promises to do so. ?Cardiac murmur: Echocardiogram will be done to assess murmur heard on auscultation. ?I reviewed CT scan and did not reveal any evidence of atherosclerotic process.  He was happy to know that. ?Patient will be seen in follow-up appointment in 12 months or earlier if the patient has any concerns ?Diet was emphasized for diabetes mellitus and weight reduction stressed.  Risks of abdominal obesity emphasized. ? ? ?Medication Adjustments/Labs and Tests Ordered: ?Current medicines are reviewed at length with the patient today.  Concerns regarding medicines are outlined above.  ?Orders Placed This Encounter  ?Procedures  ? EKG 12-Lead  ? ECHOCARDIOGRAM COMPLETE  ? ?Meds ordered this encounter  ?Medications  ? Omega-3 Fatty Acids (FISH OIL) 1000 MG CAPS  ?  Sig: Take 2 capsules (2,000 mg total) by mouth 2 (two) times daily.  ?  Dispense:  180 capsule  ?  Refill:  12  ? ? ? ?History of Present Illness:   ? ?Mark Black is a 56 y.o. male who is being seen today for the evaluation of  cardiovascular risk stratification at the request of Mark Nutting, DO.  Patient is a pleasant 56 year old male.  He has past medical history of diabetes mellitus and essential hypertension.  Comes here for assessment for coronary risk assessment.  He denies any chest pain orthopnea or PND.  He is an active gentleman.  At the time of my evaluation, the patient is alert awake oriented and in no distress. ? ?Past Medical History:  ?Diagnosis Date  ? Allergic rhinitis 12/24/2017  ? Allergy   ? Alopecia totalis   ? Anxiety 12/24/2017  ? Asthma   ? as a child  ? Chicken pox   ? Diabetes mellitus without complication ([place])   ? Elevated liver enzymes 10/13/2020  ? Essential hypertension 02/10/2013  ? Eyelid abnormality 04/23/2021  ? GERD (gastroesophageal reflux disease) 01/27/2019  ? Hypogonadism in male 06/26/2019  ? Hypothyroidism 02/10/2013  ? Lower urinary tract symptoms (LUTS) 12/24/2017  ? Screening for colon cancer 12/24/2017  ? Suspected COVID-19 virus infection 06/20/2020  ? T2DM (type 2 diabetes mellitus) ([place]) 03/21/2018  ? Vitiligo   ? ? ?Past Surgical History:  ?Procedure Laterality Date  ? CARPAL TUNNEL RELEASE    ? Bil  ? NASAL SINUS SURGERY  1994  ? cut and open air passages/nostrils  ? ? ?Current Medications: ?Current Meds  ?Medication Sig  ? amLODipine-benazepril (LOTREL) 10-40 MG capsule TAKE 1 CAPSULE BY MOUTH ONCE DAILY  ? bisoprolol-hydrochlorothiazide (ZIAC) 5-6.25 MG tablet  TAKE 1 TABLET BY MOUTH DAILY.  ? buPROPion (WELLBUTRIN XL) 150 MG 24 hr tablet TAKE 1 TABLET (150 MG TOTAL) BY MOUTH DAILY.  ? dapagliflozin propanediol (FARXIGA) 5 MG TABS tablet Take 1 tablet (5 mg total) by mouth daily.  ? desvenlafaxine (PRISTIQ) 100 MG 24 hr tablet TAKE 1 TABLET BY MOUTH DAILY.  ? glucose blood test strip Use as instructed  ? Lancets (FREESTYLE) lancets Use as instructed  ? levothyroxine (SYNTHROID) 200 MCG tablet Take 1 tablet (200 mcg total) by mouth daily before breakfast.  ? metFORMIN (GLUCOPHAGE-XR) 500 MG 24 hr  tablet TAKE 3 TABLETS (1,500 MG TOTAL) BY MOUTH DAILY WITH SUPPER.  ? Multiple Vitamins-Minerals (MULTIVITAMIN ADULT PO) Take 1 tablet by mouth daily.  ? Omega-3 Fatty Acids (FISH OIL) 1000 MG CAPS Take 2 capsules (2,000 mg total) by mouth 2 (two) times daily.  ? pantoprazole (PROTONIX) 40 MG tablet TAKE 1 TABLET (40 MG TOTAL) BY MOUTH DAILY.  ? pyridostigmine (MESTINON) 60 MG tablet Take 1 tablet by mouth 3 (three) times daily.  ? Semaglutide,0.25 or 0.'5MG'$ /DOS, (OZEMPIC, 0.25 OR 0.5 MG/DOSE,) 2 MG/3ML SOPN Inject 0.'25mg'$  into the skin once weekly for 4 weeks then increase to 0.'5mg'$  weekly  ? tamsulosin (FLOMAX) 0.4 MG CAPS capsule Take 1 capsule (0.4 mg total) by mouth daily.  ? triamcinolone cream (KENALOG) 0.1 % Apply 1 application topically 2 (two) times daily.  ? ?Current Facility-Administered Medications for the 10/24/21 encounter (Office Visit) with Eleftheria Taborn, Reita Cliche, MD  ?Medication  ? 0.9 %  sodium chloride infusion  ?  ? ?Allergies:   Patient has no known allergies.  ? ?Social History  ? ?Socioeconomic History  ? Marital status: Married  ?  Spouse name: Not on file  ? Number of children: 2  ? Years of education: Not on file  ? Highest education level: Not on file  ?Occupational History  ? Not on file  ?Tobacco Use  ? Smoking status: Never  ? Smokeless tobacco: Never  ?Vaping Use  ? Vaping Use: Never used  ?Substance and Sexual Activity  ? Alcohol use: Not Currently  ? Drug use: Not Currently  ? Sexual activity: Not on file  ?Other Topics Concern  ? Not on file  ?Social History Narrative  ? Right Handed  ? Lives in a one story home   ?   ? Father passed away in his 98's from drowning   ? ?Social Determinants of Health  ? ?Financial Resource Strain: Not on file  ?Food Insecurity: Not on file  ?Transportation Needs: Not on file  ?Physical Activity: Not on file  ?Stress: Not on file  ?Social Connections: Not on file  ?  ? ?Family History: ?The patient's family history includes Cancer in his maternal aunt;  Diabetes in his brother, father, paternal aunt, and sister; Hypertension in his father and sister. There is no history of Colon cancer, Colon polyps, Esophageal cancer, Stomach cancer, or Rectal cancer. ? ?ROS:   ?Please see the history of present illness.    ?All other systems reviewed and are negative. ? ?EKGs/Labs/Other Studies Reviewed:   ? ?The following studies were reviewed today: ?EKG reveals sinus rhythm and nonspecific ST-T changes ? ? ?Recent Labs: ?07/31/2021: ALT 61; BUN 11; Creat 0.81; Hemoglobin 17.6; Platelets 183; Potassium 4.2; Sodium 139 ?10/21/2021: TSH 1.54  ?Recent Lipid Panel ?   ?Component Value Date/Time  ? CHOL 97 07/31/2021 0000  ? TRIG 240 (H) 07/31/2021 0000  ? HDL 41 07/31/2021 0000  ?  CHOLHDL 2.4 07/31/2021 0000  ? VLDL 37.4 10/24/2018 0847  ? [place] 26 07/31/2021 0000  ? ? ?Physical Exam:   ? ?VS:  BP 112/72   Pulse 80   Ht 5' 10.6" (1.793 m)   Wt 216 lb 1.3 oz (98 kg)   SpO2 96%   BMI 30.48 kg/m?    ? ?Wt Readings from Last 3 Encounters:  ?10/24/21 216 lb 1.3 oz (98 kg)  ?08/27/21 213 lb (96.6 kg)  ?08/13/21 214 lb (97.1 kg)  ?  ? ?GEN: Patient is in no acute distress ?HEENT: Normal ?NECK: No JVD; No carotid bruits ?LYMPHATICS: No lymphadenopathy ?CARDIAC: S1 S2 regular, 2/6 systolic murmur at the apex. ?RESPIRATORY:  Clear to auscultation without rales, wheezing or rhonchi  ?ABDOMEN: Soft, non-tender, non-distended ?MUSCULOSKELETAL:  No edema; No deformity  ?SKIN: Warm and dry ?NEUROLOGIC:  Alert and oriented x 3 ?PSYCHIATRIC:  Normal affect  ? ? ?Signed, ?Mark Lindau, MD  ?10/24/2021 4:14 PM    ?[place]   ?

## 2021-10-24 NOTE — Patient Instructions (Addendum)
Medication Instructions:  ?Your physician has recommended you make the following change in your medication:  ? ?Start 2 gm fish oil over the counter twice daily. ? ?*If you need a refill on your cardiac medications before your next appointment, please call your pharmacy* ? ? ?Lab Work: ?None ordered ?If you have labs (blood work) drawn today and your tests are completely normal, you will receive your results only by: ?MyChart Message (if you have MyChart) OR ?A paper copy in the mail ?If you have any lab test that is abnormal or we need to change your treatment, we will call you to review the results. ? ? ?Testing/Procedures: ?Your physician has requested that you have an echocardiogram. Echocardiography is a painless test that uses sound waves to create images of your heart. It provides your doctor with information about the size and shape of your heart and how well your heart?s chambers and valves are working. This procedure takes approximately one hour. There are no restrictions for this procedure. ? ? ? ?Follow-Up: ?At [facility], you and your health needs are our priority.  As part of our continuing mission to provide you with exceptional heart care, we have created designated Provider Care Teams.  These Care Teams include your primary Cardiologist (physician) and Advanced Practice Providers (APPs -  Physician Assistants and Nurse Practitioners) who all work together to provide you with the care you need, when you need it. ? ?We recommend signing up for the patient portal called "MyChart".  Sign up information is provided on this After Visit Summary.  MyChart is used to connect with patients for Virtual Visits (Telemedicine).  Patients are able to view lab/test results, encounter notes, upcoming appointments, etc.  Non-urgent messages can be sent to your provider as well.   ?To learn more about what you can do with MyChart, go to [email or web address].   ? ?Your next appointment:   ?12 month(s) ? ?The  format for your next appointment:   ?In Person ? ?Provider:   ?Jyl Heinz, MD ? ? ?Other Instructions ?Echocardiogram ?An echocardiogram is a test that uses sound waves (ultrasound) to produce images of the heart. ?Images from an echocardiogram can provide important information about: ?Heart size and shape. ?The size and thickness and movement of your heart's walls. ?Heart muscle function and strength. ?Heart valve function or if you have stenosis. Stenosis is when the heart valves are too narrow. ?If blood is flowing backward through the heart valves (regurgitation). ?A tumor or infectious growth around the heart valves. ?Areas of heart muscle that are not working well because of poor blood flow or injury from a heart attack. ?Aneurysm detection. An aneurysm is a weak or damaged part of an artery wall. The wall bulges out from the normal force of blood pumping through the body. ?Tell a health care provider about: ?Any allergies you have. ?All medicines you are taking, including vitamins, herbs, eye drops, creams, and over-the-counter medicines. ?Any blood disorders you have. ?Any surgeries you have had. ?Any medical conditions you have. ?Whether you are pregnant or may be pregnant. ?What are the risks? ?Generally, this is a safe test. However, problems may occur, including an allergic reaction to dye (contrast) that may be used during the test. ?What happens before the test? ?No specific preparation is needed. You may eat and drink normally. ?What happens during the test? ?You will take off your clothes from the waist up and put on a hospital gown. ?Electrodes or electrocardiogram (ECG)patches may be placed  on your chest. The electrodes or patches are then connected to a device that monitors your heart rate and rhythm. ?You will lie down on a table for an ultrasound exam. A gel will be applied to your chest to help sound waves pass through your skin. ?A handheld device, called a transducer, will be pressed  against your chest and moved over your heart. The transducer produces sound waves that travel to your heart and bounce back (or "echo" back) to the transducer. These sound waves will be captured in real-time and changed into images of your heart that can be viewed on a video monitor. The images will be recorded on a computer and reviewed by your health care provider. ?You may be asked to change positions or hold your breath for a short time. This makes it easier to get different views or better views of your heart. ?In some cases, you may receive contrast through an IV in one of your veins. This can improve the quality of the pictures from your heart. ?The procedure may vary among health care providers and hospitals.   ?What can I expect after the test? ?You may return to your normal, everyday life, including diet, activities, and medicines, unless your health care provider tells you not to do that. ?Follow these instructions at home: ?It is up to you to get the results of your test. Ask your health care provider, or the department that is doing the test, when your results will be ready. ?Keep all follow-up visits. This is important. ?Summary ?An echocardiogram is a test that uses sound waves (ultrasound) to produce images of the heart. ?Images from an echocardiogram can provide important information about the size and shape of your heart, heart muscle function, heart valve function, and other possible heart problems. ?You do not need to do anything to prepare before this test. You may eat and drink normally. ?After the echocardiogram is completed, you may return to your normal, everyday life, unless your health care provider tells you not to do that. ?This information is not intended to replace advice given to you by your health care provider. Make sure you discuss any questions you have with your health care provider. ?Document Revised: 02/20/2020 Document Reviewed: 02/20/2020 ?Elsevier Patient Education ? [employer]. ? ? ?

## 2021-10-28 ENCOUNTER — Other Ambulatory Visit (HOSPITAL_COMMUNITY): Payer: Self-pay

## 2021-10-28 ENCOUNTER — Other Ambulatory Visit: Payer: Self-pay | Admitting: Family Medicine

## 2021-10-28 MED ORDER — BUPROPION HCL ER (XL) 150 MG PO TB24
ORAL_TABLET | Freq: Every day | ORAL | 0 refills | Status: DC
  Filled 2021-10-28: qty 90, 90d supply, fill #0

## 2021-10-29 ENCOUNTER — Other Ambulatory Visit (HOSPITAL_COMMUNITY): Payer: Self-pay

## 2021-11-10 ENCOUNTER — Ambulatory Visit (HOSPITAL_BASED_OUTPATIENT_CLINIC_OR_DEPARTMENT_OTHER)
Admission: RE | Admit: 2021-11-10 | Discharge: 2021-11-10 | Disposition: A | Discharge status: Home / Self Care | Payer: No Typology Code available for payment source | Source: Ambulatory Visit | Attending: Cardiology | Admitting: Cardiology

## 2021-11-10 DIAGNOSIS — R011 Cardiac murmur, unspecified: Secondary | ICD-10-CM | POA: Insufficient documentation

## 2021-11-10 LAB — ECHOCARDIOGRAM COMPLETE
AR max vel: 3.13 cm2
AV Area VTI: 3.54 cm2
AV Area mean vel: 3.44 cm2
AV Mean grad: 2 mmHg
AV Peak grad: 5.1 mmHg
Ao pk vel: 1.13 m/s
Area-P 1/2: 3.13 cm2
S' Lateral: 3.2 cm

## 2021-11-10 NOTE — Progress Notes (Signed)
?  Echocardiogram ?2D Echocardiogram has been performed. ? ?Elmer Ramp ?11/10/2021, 3:54 PM ?

## 2021-11-24 ENCOUNTER — Other Ambulatory Visit (HOSPITAL_COMMUNITY): Payer: Self-pay

## 2021-12-08 ENCOUNTER — Other Ambulatory Visit: Payer: Self-pay | Admitting: Family Medicine

## 2021-12-09 ENCOUNTER — Other Ambulatory Visit (HOSPITAL_COMMUNITY): Payer: Self-pay

## 2021-12-09 MED ORDER — AMLODIPINE BESY-BENAZEPRIL HCL 10-40 MG PO CAPS
1.0000 | ORAL_CAPSULE | Freq: Every day | ORAL | 3 refills | Status: DC
  Filled 2021-12-09: qty 90, 90d supply, fill #0
  Filled 2022-03-07: qty 90, 90d supply, fill #1
  Filled 2022-06-21: qty 90, 90d supply, fill #2
  Filled 2022-09-15: qty 90, 90d supply, fill #3

## 2021-12-09 MED ORDER — DESVENLAFAXINE SUCCINATE ER 100 MG PO TB24
ORAL_TABLET | Freq: Every day | ORAL | 3 refills | Status: DC
  Filled 2021-12-09: qty 90, 90d supply, fill #0
  Filled 2022-03-07: qty 90, 90d supply, fill #1
  Filled 2022-06-15: qty 90, 90d supply, fill #2
  Filled 2022-08-31: qty 90, 90d supply, fill #3

## 2021-12-10 ENCOUNTER — Other Ambulatory Visit (HOSPITAL_COMMUNITY): Payer: Self-pay

## 2022-01-06 ENCOUNTER — Other Ambulatory Visit (HOSPITAL_COMMUNITY): Payer: Self-pay

## 2022-01-19 ENCOUNTER — Encounter: Payer: Self-pay | Admitting: Neurology

## 2022-01-19 ENCOUNTER — Telehealth: Payer: Self-pay

## 2022-01-19 NOTE — Telephone Encounter (Signed)
Called patient and left a message for a call back.   Need to inform patient per Dr. Delice Lesch:  "Pls call patient and ask if he has any double vision, problem swallowing, or if he is having more problems opening his eyes. If none of these, the eye sensitivity is not from myasthenia, recommend eye doctor evaluation."

## 2022-01-20 NOTE — Telephone Encounter (Signed)
Called patient and left a message for a call back.   See Mychart message.

## 2022-01-22 ENCOUNTER — Other Ambulatory Visit (HOSPITAL_COMMUNITY): Payer: Self-pay

## 2022-01-26 ENCOUNTER — Telehealth: Payer: Self-pay | Admitting: Neurology

## 2022-01-26 NOTE — Telephone Encounter (Signed)
Pt is returning a call to [facility]

## 2022-01-27 NOTE — Telephone Encounter (Signed)
Called patient and he informed me that he is scheduled to see Dr. Pandora Leiter on Friday for his eye.

## 2022-01-30 ENCOUNTER — Other Ambulatory Visit (HOSPITAL_COMMUNITY): Payer: Self-pay

## 2022-01-30 MED ORDER — ONDANSETRON HCL 4 MG PO TABS
ORAL_TABLET | ORAL | 1 refills | Status: DC
  Filled 2022-01-30: qty 30, 30d supply, fill #0
  Filled 2022-10-03 (×2): qty 30, 30d supply, fill #1

## 2022-02-10 ENCOUNTER — Other Ambulatory Visit: Payer: Self-pay | Admitting: Family Medicine

## 2022-02-10 ENCOUNTER — Other Ambulatory Visit (HOSPITAL_COMMUNITY): Payer: Self-pay

## 2022-02-10 MED ORDER — BUPROPION HCL ER (XL) 150 MG PO TB24
ORAL_TABLET | Freq: Every day | ORAL | 0 refills | Status: DC
  Filled 2022-02-10: qty 90, 90d supply, fill #0

## 2022-02-10 NOTE — Progress Notes (Unsigned)
Follow-up Visit   Date: 02/11/22   Mark Black MRN: [id number] DOB: 1966-02-26   Interim History: Mark Black is a 56 y.o. 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  The patient was accompanied to the clinic by wife who also provides collateral information.    History of present illness: Starting around the fall of 2022, he began having intermittent droopiness of the left eyelid.  It can occur several days of the week and when present, it will be constant all day.  Rest does not seem to make any improvement, as he can wake up with it.  No double vision, difficulty swallowing/talking, jaw fatigue, or limb weakness.  He was seen by his ophthalmologist who referred him for evaluation of myasthenia gravis.    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    UPDATE 08/27/2021:  He is here to discuss the results of his myasthenia panel which was positive for AChR antibody (blocking and modulating).  He continues to have intermittent left ptosis.  No double vision, difficulty swallowing/talking, or limb weakness.    UPDATE 02/11/2022:  For the past month, he has noticed increased droopiness of the right eyelid where it will often completely cover his pupil, impairing his vision.  He has some droopiness of the left eyelid at times and has one episode of double vision.  No recent medication changes, illnesses, or stressors.  He was previously taking mestinon as needed, which resolved his ocular symptoms quickly, however over the past month, he increased mestinon to '60mg'$  TID and does not feel that it has helped.  He reports having stomach cramping and nausea on the higher dose of mestinon and was offered zofran by his ophthalmologist. He denies difficulty swallowing/talking or limb weakness.      Medications:  Current Outpatient Medications on File Prior to Visit  Medication Sig Dispense Refill   amLODipine-benazepril (LOTREL) 10-40 MG capsule TAKE 1 CAPSULE BY MOUTH ONCE DAILY 90 capsule 3   bisoprolol-hydrochlorothiazide (ZIAC) 5-6.25 MG tablet TAKE 1 TABLET BY MOUTH DAILY. 90 tablet 1   buPROPion (WELLBUTRIN XL) 150 MG 24 hr tablet TAKE 1 TABLET (150 MG TOTAL) BY MOUTH DAILY. 90 tablet 0   dapagliflozin propanediol (FARXIGA) 5 MG TABS tablet Take 1 tablet (5 mg total) by mouth daily. 90 tablet 2   desvenlafaxine (PRISTIQ) 100 MG 24 hr tablet TAKE 1 TABLET BY MOUTH DAILY. 90 tablet 3   glucose blood test strip Use as instructed 100 each 12   Lancets (FREESTYLE) lancets Use as instructed 100 each 12   levothyroxine (SYNTHROID) 200 MCG tablet Take 1 tablet (200 mcg total) by mouth daily before breakfast. 90 tablet 1   metFORMIN (GLUCOPHAGE-XR) 500 MG 24 hr tablet TAKE 3 TABLETS (1,500 MG TOTAL) BY MOUTH DAILY WITH SUPPER. 270 tablet 2   Multiple Vitamins-Minerals (MULTIVITAMIN ADULT PO) Take 1 tablet by mouth daily.     Omega-3 Fatty Acids (FISH OIL) 1000 MG CAPS Take 2 capsules (2,000 mg total) by mouth 2 (two) times daily. 180 capsule 12   ondansetron (ZOFRAN) 4 MG tablet Take 1 tablet by mouth as needed. 30 tablet 1   pantoprazole (PROTONIX) 40 MG tablet TAKE 1 TABLET (40 MG TOTAL) BY MOUTH DAILY. 90 tablet 2   pyridostigmine (MESTINON) 60 MG tablet Take 1 tablet by mouth 3 (three) times daily. [place]  tablet 11   tamsulosin (FLOMAX) 0.4 MG CAPS capsule Take 1 capsule (0.4 mg total) by mouth daily. 90 capsule 3   triamcinolone cream (KENALOG) 0.1 % Apply 1 application topically 2 (two) times daily. 30 g 0   Semaglutide,0.25 or 0.'5MG'$ /DOS, (OZEMPIC, 0.25 OR 0.5 MG/DOSE,) 2 MG/3ML SOPN Inject 0.'25mg'$  into the skin once weekly for 4 weeks then increase to 0.'5mg'$  weekly (Patient not taking: Reported on 02/11/2022) 3 mL 3   Current Facility-Administered Medications on File Prior to Visit   Medication Dose Route Frequency Provider Last Rate Last Admin   0.9 %  sodium chloride infusion  500 mL Intravenous Once Armbruster, Carlota Raspberry, MD        Allergies: No Known Allergies  Vital Signs:  BP 129/81   Pulse 73   Ht 5' 10.6" (1.793 m)   Wt 211 lb (95.7 kg)   SpO2 97%   BMI 29.76 kg/m    Neurological Exam: MENTAL STATUS including orientation to time, place, person, recent and remote memory, attention span and concentration, language, and fund of knowledge is normal.  Speech is not dysarthric.  CRANIAL NERVES:   Left eye is blind.  Normal conjugate, extra-ocular eye movements in all directions of gaze.  Moderate to severe right ptosis with eyelid cover > 50% of the pupil at times and worse with sustained upgaze.  Mild left ptosis.  Face is symmetric. Palate elevates symmetrically.  Tongue is midline.  Orbicularis oculi and buccinator is 5/5.  MOTOR:  Motor strength is 5/5 in all extremities, without fatigability.  No atrophy, fasciculations or abnormal movements.  No pronator drift.  Tone is normal.    COORDINATION/GAIT:   Gait narrow based and stable.   Data: Myasthenia gravis panel 08/13/2021:  AChR blocking 40*, modulating 79*  Lab Results  Component Value Date   HGBA1C 6.7 10/23/2021   CT chest wwo contrast 09/12/2021:  Negative for thymoma  IMPRESSION/PLAN: Seropositive myasthenia gravis with exacerbation, ocular. Thymoma negative.  Diagnosed February 2023.  - Symptoms initially controlled on mestinon prn  - July 2023 worsening ocular symptoms.  Clinically with moderate to severe R ptosis   PLAN/RECOMMENDATIONS:  Start prednisone '10mg'$  daily Reduce mestinon to 30-'60mg'$  BID due to GI side effects and poor response.  If there is no worsening symptoms on lower dose of mestinon, ok to stop and take as needed. Patient to provide update in 2-3 weeks at which time further prednisone adjustment can be decided  Return to clinic in 2 months, or sooner as needed   Thank  you for allowing me to participate in patient's care.  If I can answer any additional questions, I would be pleased to do so.    Sincerely,    Brogen Duell K. Posey Pronto, DO

## 2022-02-11 ENCOUNTER — Encounter: Payer: Self-pay | Admitting: Neurology

## 2022-02-11 ENCOUNTER — Ambulatory Visit (INDEPENDENT_AMBULATORY_CARE_PROVIDER_SITE_OTHER): Payer: No Typology Code available for payment source | Admitting: Neurology

## 2022-02-11 ENCOUNTER — Other Ambulatory Visit (HOSPITAL_COMMUNITY): Payer: Self-pay

## 2022-02-11 VITALS — BP 129/81 | HR 73 | Ht 70.6 in | Wt 211.0 lb

## 2022-02-11 DIAGNOSIS — H02402 Unspecified ptosis of left eyelid: Secondary | ICD-10-CM | POA: Diagnosis not present

## 2022-02-11 DIAGNOSIS — G7001 Myasthenia gravis with (acute) exacerbation: Secondary | ICD-10-CM | POA: Diagnosis not present

## 2022-02-11 MED ORDER — PREDNISONE 10 MG PO TABS
10.0000 mg | ORAL_TABLET | Freq: Every day | ORAL | 3 refills | Status: DC
  Filled 2022-02-11: qty 30, 30d supply, fill #0

## 2022-02-11 NOTE — Patient Instructions (Addendum)
Start prednisone '10mg'$  daily  Reduce pyridostigmine to '30mg'$  (half tablet) - '60mg'$  (1 tablets) twice daily  Please send a MyChart update in 2-3 weeks  Return to clinic 2 months

## 2022-02-25 ENCOUNTER — Encounter: Payer: Self-pay | Admitting: Neurology

## 2022-02-26 ENCOUNTER — Other Ambulatory Visit (HOSPITAL_COMMUNITY): Payer: Self-pay

## 2022-02-26 MED ORDER — PREDNISONE 10 MG PO TABS
20.0000 mg | ORAL_TABLET | Freq: Every day | ORAL | 3 refills | Status: DC
  Filled 2022-02-26: qty 60, 30d supply, fill #0
  Filled 2022-03-19: qty 60, 30d supply, fill #1

## 2022-03-07 ENCOUNTER — Other Ambulatory Visit (HOSPITAL_COMMUNITY): Payer: Self-pay

## 2022-03-09 ENCOUNTER — Other Ambulatory Visit (HOSPITAL_COMMUNITY): Payer: Self-pay

## 2022-03-16 ENCOUNTER — Encounter: Payer: Self-pay | Admitting: Neurology

## 2022-03-19 ENCOUNTER — Other Ambulatory Visit (HOSPITAL_COMMUNITY): Payer: Self-pay

## 2022-03-23 ENCOUNTER — Other Ambulatory Visit (HOSPITAL_COMMUNITY): Payer: Self-pay

## 2022-03-23 MED ORDER — PREDNISONE 10 MG PO TABS
25.0000 mg | ORAL_TABLET | Freq: Every day | ORAL | 3 refills | Status: DC
  Filled 2022-03-23: qty 75, 30d supply, fill #0

## 2022-03-24 ENCOUNTER — Other Ambulatory Visit (HOSPITAL_COMMUNITY): Payer: Self-pay

## 2022-03-27 ENCOUNTER — Ambulatory Visit: Payer: No Typology Code available for payment source | Admitting: Neurology

## 2022-04-14 ENCOUNTER — Other Ambulatory Visit (HOSPITAL_COMMUNITY): Payer: Self-pay

## 2022-04-14 NOTE — Progress Notes (Unsigned)
Follow-up Visit   Date: 04/15/22   Mark Black MRN: [id number] DOB: 1965-08-15   Interim History: Mark Black is a 56 y.o. 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  The patient was accompanied to the clinic by wife who also provides collateral information.     IMPRESSION/PLAN: Seropositive myasthenia gravis with mild exacerbation, ocular. Thymoma negative.  Diagnosed February 2023.  - Symptoms initially controlled on mestinon prn  - July 2023 worsening ocular symptoms.  Clinically with moderate to severe R ptosis  - Sept 2023 prednisone increased to '25mg'$ /d which improved ptosis, mestinon stopped due to GI side effects   PLAN/RECOMMENDATIONS:  Continue prednisone '25mg'$  daily x 2 weeks, followed by slow taper  After two weeks, reduce prednisone to '25mg'$  alternate with '20mg'$  x 2 weeks, then '20mg'$  daily.  Return to clinic in 2 months, or sooner as needed  -------------------------------------------  History of present illness: Starting around the fall of 2022, he began having intermittent droopiness of the left eyelid.  It can occur several days of the week and when present, it will be constant all day.  Rest does not seem to make any improvement, as he can wake up with it.  No double vision, difficulty swallowing/talking, jaw fatigue, or limb weakness.  He was seen by his ophthalmologist who referred him for evaluation of myasthenia gravis.    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    UPDATE 08/27/2021:  He is here to discuss the results of his myasthenia panel which was positive for AChR antibody (blocking and modulating).  He continues to have intermittent left ptosis.  No double vision, difficulty swallowing/talking, or limb weakness.    UPDATE 02/11/2022:  For  the past month, he has noticed increased droopiness of the right eyelid where it will often completely cover his pupil, impairing his vision.  He has some droopiness of the left eyelid at times and has one episode of double vision.  No recent medication changes, illnesses, or stressors.  He was previously taking mestinon as needed, which resolved his ocular symptoms quickly, however over the past month, he increased mestinon to '60mg'$  TID and does not feel that it has helped.  He reports having stomach cramping and nausea on the higher dose of mestinon and was offered zofran by his ophthalmologist. He denies difficulty swallowing/talking or limb weakness.   UPDATE 04/15/2022:  He is here for follow-up.   Since prednisone was increased to '25mg'$ , he has noticed improved double vision and droopiness of the eyes.  He discontinued mestinon due to GI upset. No problems with speech/swallow or limb weakness.    Medications:  Current Outpatient Medications on File Prior to Visit  Medication Sig Dispense Refill   amLODipine-benazepril (LOTREL) 10-40 MG capsule TAKE 1 CAPSULE BY MOUTH ONCE DAILY 90 capsule 3   bisoprolol-hydrochlorothiazide (ZIAC) 5-6.25 MG tablet TAKE 1 TABLET BY MOUTH DAILY. 90 tablet 1   buPROPion (WELLBUTRIN XL) 150 MG 24 hr tablet TAKE 1 TABLET (150 MG TOTAL) BY MOUTH DAILY. 90 tablet 0   dapagliflozin propanediol (FARXIGA) 5 MG TABS tablet Take 1 tablet (5 mg total) by mouth daily. 90 tablet 2   desvenlafaxine (PRISTIQ) 100 MG 24 hr tablet TAKE 1 TABLET BY MOUTH DAILY. 90 tablet 3   glucose blood test strip Use as instructed 100 each 12   Lancets (  FREESTYLE) lancets Use as instructed 100 each 12   levothyroxine (SYNTHROID) 200 MCG tablet Take 1 tablet (200 mcg total) by mouth daily before breakfast. 90 tablet 1   metFORMIN (GLUCOPHAGE-XR) 500 MG 24 hr tablet TAKE 3 TABLETS (1,500 MG TOTAL) BY MOUTH DAILY WITH SUPPER. 270 tablet 2   Multiple Vitamins-Minerals (MULTIVITAMIN ADULT PO) Take 1  tablet by mouth daily.     Omega-3 Fatty Acids (FISH OIL) 1000 MG CAPS Take 2 capsules (2,000 mg total) by mouth 2 (two) times daily. 180 capsule 12   ondansetron (ZOFRAN) 4 MG tablet Take 1 tablet by mouth as needed. 30 tablet 1   pantoprazole (PROTONIX) 40 MG tablet TAKE 1 TABLET (40 MG TOTAL) BY MOUTH DAILY. 90 tablet 2   tamsulosin (FLOMAX) 0.4 MG CAPS capsule Take 1 capsule (0.4 mg total) by mouth daily. 90 capsule 3   triamcinolone cream (KENALOG) 0.1 % Apply 1 application topically 2 (two) times daily. 30 g 0   pyridostigmine (MESTINON) 60 MG tablet Take 1 tablet by mouth 3 (three) times daily. (Patient not taking: Reported on 04/15/2022) 90 tablet 11   Semaglutide,0.25 or 0.'5MG'$ /DOS, (OZEMPIC, 0.25 OR 0.5 MG/DOSE,) 2 MG/3ML SOPN Inject 0.'25mg'$  into the skin once weekly for 4 weeks then increase to 0.'5mg'$  weekly (Patient not taking: Reported on 02/11/2022) 3 mL 3   Current Facility-Administered Medications on File Prior to Visit  Medication Dose Route Frequency Provider Last Rate Last Admin   0.9 %  sodium chloride infusion  500 mL Intravenous Once Armbruster, Carlota Raspberry, MD        Allergies: No Known Allergies  Vital Signs:  BP 128/83   Pulse 92   Ht 5' 10.5" (1.791 m)   Wt 202 lb (91.6 kg)   SpO2 96%   BMI 28.57 kg/m    Neurological Exam: MENTAL STATUS including orientation to time, place, person, recent and remote memory, attention span and concentration, language, and fund of knowledge is normal.  Speech is not dysarthric.  CRANIAL NERVES:   Left eye is blind.  Normal conjugate, extra-ocular eye movements in all directions of gaze.  Mild left ptosis, no worsening with sustained upgaze.  Face is symmetric. Palate elevates symmetrically.  Tongue is midline.  Orbicularis oculi and buccinator is 5/5.  MOTOR:  Motor strength is 5/5 in all extremities, without fatigability.  No atrophy, fasciculations or abnormal movements.  No pronator drift.  Tone is normal.    COORDINATION/GAIT:    Gait narrow based and stable.   Data: Myasthenia gravis panel 08/13/2021:  AChR blocking 40*, modulating 79*  Lab Results  Component Value Date   HGBA1C 6.7 10/23/2021   CT chest wwo contrast 09/12/2021:  Negative for thymoma   Thank you for allowing me to participate in patient's care.  If I can answer any additional questions, I would be pleased to do so.    Sincerely,    Rashiya Lofland K. Posey Pronto, DO

## 2022-04-15 ENCOUNTER — Encounter: Payer: Self-pay | Admitting: Neurology

## 2022-04-15 ENCOUNTER — Other Ambulatory Visit (HOSPITAL_COMMUNITY): Payer: Self-pay

## 2022-04-15 ENCOUNTER — Ambulatory Visit (INDEPENDENT_AMBULATORY_CARE_PROVIDER_SITE_OTHER): Payer: No Typology Code available for payment source | Admitting: Neurology

## 2022-04-15 VITALS — BP 128/83 | HR 92 | Ht 70.5 in | Wt 202.0 lb

## 2022-04-15 DIAGNOSIS — G7001 Myasthenia gravis with (acute) exacerbation: Secondary | ICD-10-CM | POA: Diagnosis not present

## 2022-04-15 MED ORDER — PREDNISONE 5 MG PO TABS
ORAL_TABLET | ORAL | 3 refills | Status: DC
  Filled 2022-04-15: qty 30, 30d supply, fill #0
  Filled 2022-06-15: qty 30, 30d supply, fill #1
  Filled 2022-08-31: qty 30, 30d supply, fill #2

## 2022-04-15 MED ORDER — PREDNISONE 10 MG PO TABS
ORAL_TABLET | ORAL | 3 refills | Status: DC
  Filled 2022-04-15: qty 75, 30d supply, fill #0
  Filled 2022-06-01: qty 60, 30d supply, fill #1
  Filled 2022-07-08: qty 60, 30d supply, fill #2
  Filled 2022-08-11: qty 60, 30d supply, fill #3
  Filled 2022-09-15: qty 45, 10d supply, fill #4

## 2022-04-15 NOTE — Patient Instructions (Signed)
Take prednisone '25mg'$  daily x 2 weeks, then reduce to '25mg'$  alternate with '20mg'$  x 2 weeks, then '20mg'$  daily.  If you develop worsening droopiness of the eyelids, go back to taking prednisone '25mg'$    Return to clinic 2 months

## 2022-04-16 ENCOUNTER — Other Ambulatory Visit (HOSPITAL_COMMUNITY): Payer: Self-pay

## 2022-04-24 ENCOUNTER — Other Ambulatory Visit (HOSPITAL_COMMUNITY): Payer: Self-pay

## 2022-04-24 ENCOUNTER — Other Ambulatory Visit: Payer: Self-pay | Admitting: Family Medicine

## 2022-04-24 NOTE — Telephone Encounter (Signed)
Pt already has an appointment on 04/27/2022 for T2DM, can he do med refills on this day as well? tvt

## 2022-04-27 ENCOUNTER — Encounter: Payer: Self-pay | Admitting: Family Medicine

## 2022-04-27 ENCOUNTER — Ambulatory Visit (INDEPENDENT_AMBULATORY_CARE_PROVIDER_SITE_OTHER): Payer: No Typology Code available for payment source | Admitting: Family Medicine

## 2022-04-27 ENCOUNTER — Other Ambulatory Visit (HOSPITAL_COMMUNITY): Payer: Self-pay

## 2022-04-27 VITALS — BP 110/68 | HR 108 | Ht 70.5 in | Wt 200.0 lb

## 2022-04-27 DIAGNOSIS — L630 Alopecia (capitis) totalis: Secondary | ICD-10-CM

## 2022-04-27 DIAGNOSIS — E119 Type 2 diabetes mellitus without complications: Secondary | ICD-10-CM | POA: Diagnosis not present

## 2022-04-27 DIAGNOSIS — I10 Essential (primary) hypertension: Secondary | ICD-10-CM

## 2022-04-27 DIAGNOSIS — Z23 Encounter for immunization: Secondary | ICD-10-CM

## 2022-04-27 DIAGNOSIS — E039 Hypothyroidism, unspecified: Secondary | ICD-10-CM

## 2022-04-27 DIAGNOSIS — G7000 Myasthenia gravis without (acute) exacerbation: Secondary | ICD-10-CM

## 2022-04-27 LAB — POCT GLYCOSYLATED HEMOGLOBIN (HGB A1C): HbA1c, POC (controlled diabetic range): 8.7 % — AB (ref 0.0–7.0)

## 2022-04-27 LAB — POCT UA - MICROALBUMIN
Creatinine, POC: 50 mg/dL
Microalbumin Ur, POC: 30 mg/L

## 2022-04-27 MED ORDER — OZEMPIC (0.25 OR 0.5 MG/DOSE) 2 MG/3ML ~~LOC~~ SOPN
PEN_INJECTOR | SUBCUTANEOUS | 3 refills | Status: DC
  Filled 2022-04-27: qty 3, 28d supply, fill #0

## 2022-04-27 NOTE — Patient Instructions (Signed)
Restart Ozempic.  See me again in 3 months.

## 2022-04-27 NOTE — Progress Notes (Signed)
Mark Black - 56 y.o. male MRN [id number]  Date of birth: 11-02-1965  Subjective Chief Complaint  Patient presents with   Diabetes    HPI Mark Black Feick is a 56 y.o. male here today for follow up visit.   He reports that he is doing well at this time.   Seeing neurology for myasthenia.  Initially on mestinon however continued to have symptoms.  Currently on prednisone.   Doing well with metformin, farxiga for management of diabetes.  Tolerating well without side effects.  No longer taking ozempic.  His diabetes has traditionally been well controlled with this.  He has not checked blood sugars since starting prednisone  He feels good with current dose of levothyroxine.    BP is well controlled with current medications.  No side effects at this time.  Denies chest pain, shortness of breath, palpitations, headache or vision changes.   ROS:  A comprehensive ROS was completed and negative except as noted per HPI  No Known Allergies  Past Medical History:  Diagnosis Date   Allergic rhinitis 12/24/2017   Allergy    Alopecia totalis    Anxiety 12/24/2017   Asthma    as a child   Chicken pox    Diabetes mellitus without complication ([place])    Elevated liver enzymes 10/13/2020   Essential hypertension 02/10/2013   Eyelid abnormality 04/23/2021   GERD (gastroesophageal reflux disease) 01/27/2019   Hypogonadism in male 06/26/2019   Hypothyroidism 02/10/2013   Lower urinary tract symptoms (LUTS) 12/24/2017   Screening for colon cancer 12/24/2017   Suspected COVID-19 virus infection 06/20/2020   T2DM (type 2 diabetes mellitus) ([place]) 03/21/2018   Vitiligo     Past Surgical History:  Procedure Laterality Date   CARPAL TUNNEL RELEASE     Bil   NASAL SINUS SURGERY  1994   cut and open air passages/nostrils    Social History   Socioeconomic History   Marital status: Married    Spouse name: Not on file   Number of children: 2   Years of education: Not on file   Highest  education level: Not on file  Occupational History   Not on file  Tobacco Use   Smoking status: Never   Smokeless tobacco: Never  Vaping Use   Vaping Use: Never used  Substance and Sexual Activity   Alcohol use: Not Currently   Drug use: Not Currently   Sexual activity: Not on file  Other Topics Concern   Not on file  Social History Narrative   Right Handed   Lives in a one story home       Father passed away in his 43's from drowning    Social Determinants of Health   Financial Resource Strain: Not on file  Food Insecurity: Not on file  Transportation Needs: Not on file  Physical Activity: Not on file  Stress: Not on file  Social Connections: Not on file    Family History  Problem Relation Age of Onset   Diabetes Father    Hypertension Father    Diabetes Paternal Aunt    Cancer Maternal Aunt        breast   Diabetes Sister    Hypertension Sister    Diabetes Brother    Colon cancer Neg Hx    Colon polyps Neg Hx    Esophageal cancer Neg Hx    Stomach cancer Neg Hx    Rectal cancer Neg Hx     Health Maintenance  Topic Date Due   FOOT EXAM  04/23/2022   COVID-19 Vaccine (3 - Pfizer series) 08/13/2022 (Originally 12/25/2019)   Hepatitis C Screening  10/24/2022 (Originally 01/11/1982)   HIV Screening  10/24/2022 (Originally 01/12/1979)   OPHTHALMOLOGY EXAM  05/20/2022   Diabetic kidney evaluation - GFR measurement  07/31/2022   HEMOGLOBIN A1C  10/27/2022   Diabetic kidney evaluation - Urine ACR  04/28/2023   TETANUS/TDAP  01/27/2027   COLONOSCOPY (Pts 45-67yr Insurance coverage will need to be confirmed)  05/27/2028   INFLUENZA VACCINE  Completed   Zoster Vaccines- Shingrix  Completed   HPV VACCINES  Aged Out     ----------------------------------------------------------------------------------------------------------------------------------------------------------------------------------------------------------------- Physical Exam BP 110/68 (BP Location:  Left Arm, Patient Position: Sitting, Cuff Size: Large)   Pulse (!) 108   Ht 5' 10.5" (1.791 m)   Wt 200 lb (90.7 kg)   SpO2 96%   BMI 28.29 kg/m   Physical Exam Constitutional:      Appearance: Normal appearance.  Eyes:     General: No scleral icterus. Cardiovascular:     Rate and Rhythm: Normal rate and regular rhythm.  Pulmonary:     Effort: Pulmonary effort is normal.     Breath sounds: Normal breath sounds.  Neurological:     Mental Status: He is alert.  Psychiatric:        Mood and Affect: Mood normal.        Behavior: Behavior normal.     ------------------------------------------------------------------------------------------------------------------------------------------------------------------------------------------------------------------- Assessment and Plan  Diabetes mellitus without complication ([place] Lab Results  Component Value Date   HGBA1C 8.7 (A) 04/27/2022  Blood sugars are elevated today.  Recommend adding Ozempic back on.  Titrate up as tolerated based on response.  Hypothyroidism Lab Results  Component Value Date   TSH 1.54 10/21/2021  TSH has remained stable.  Myasthenia gravis without acute exacerbation (HCC) Currently on prednisone.  Stable at this time with current strength.  Continue management per neurology.  Alopecia totalis Likely related to autoimmune process contributing to his myasthenia and hypothyroidism.  Essential hypertension Blood pressure remains well controlled with current medications.  Recommend continuation.   Meds ordered this encounter  Medications   Semaglutide,0.25 or 0.'5MG'$ /DOS, (OZEMPIC, 0.25 OR 0.5 MG/DOSE,) 2 MG/3ML SOPN    Sig: Inject 0.'25mg'$  into the skin once weekly for 4 weeks then increase to 0.'5mg'$  weekly    Dispense:  3 mL    Refill:  3    Return in about 3 months (around 07/28/2022) for T2DM.    This visit occurred during the SARS-CoV-2 public health emergency.  Safety protocols were in place,  including screening questions prior to the visit, additional usage of staff PPE, and extensive cleaning of exam room while observing appropriate contact time as indicated for disinfecting solutions.

## 2022-04-28 ENCOUNTER — Encounter: Payer: Self-pay | Admitting: Family Medicine

## 2022-04-29 ENCOUNTER — Other Ambulatory Visit (HOSPITAL_COMMUNITY): Payer: Self-pay

## 2022-04-29 MED ORDER — BISOPROLOL-HYDROCHLOROTHIAZIDE 5-6.25 MG PO TABS
1.0000 | ORAL_TABLET | Freq: Every day | ORAL | 1 refills | Status: DC
  Filled 2022-04-29: qty 90, 90d supply, fill #0
  Filled 2022-07-22: qty 90, 90d supply, fill #1

## 2022-04-30 ENCOUNTER — Other Ambulatory Visit: Payer: Self-pay | Admitting: Family Medicine

## 2022-05-01 ENCOUNTER — Encounter: Payer: Self-pay | Admitting: Family Medicine

## 2022-05-01 ENCOUNTER — Other Ambulatory Visit (HOSPITAL_COMMUNITY): Payer: Self-pay

## 2022-05-01 DIAGNOSIS — G7000 Myasthenia gravis without (acute) exacerbation: Secondary | ICD-10-CM | POA: Insufficient documentation

## 2022-05-01 MED ORDER — LEVOTHYROXINE SODIUM 200 MCG PO TABS
200.0000 ug | ORAL_TABLET | Freq: Every day | ORAL | 1 refills | Status: DC
  Filled 2022-05-01: qty 90, 90d supply, fill #0
  Filled 2022-07-22: qty 90, 90d supply, fill #1

## 2022-05-01 NOTE — Assessment & Plan Note (Addendum)
Likely related to autoimmune process contributing to his myasthenia and hypothyroidism.

## 2022-05-01 NOTE — Assessment & Plan Note (Signed)
Blood pressure remains well controlled with current medications.  Recommend continuation.

## 2022-05-01 NOTE — Assessment & Plan Note (Signed)
Lab Results  Component Value Date   TSH 1.54 10/21/2021  TSH has remained stable.

## 2022-05-01 NOTE — Assessment & Plan Note (Signed)
Lab Results  Component Value Date   HGBA1C 8.7 (A) 04/27/2022  Blood sugars are elevated today.  Recommend adding Ozempic back on.  Titrate up as tolerated based on response.

## 2022-05-01 NOTE — Assessment & Plan Note (Signed)
Currently on prednisone.  Stable at this time with current strength.  Continue management per neurology.

## 2022-05-01 NOTE — Telephone Encounter (Signed)
Yes but we would need to recheck TSH in about 6 weeks after the change.

## 2022-05-02 ENCOUNTER — Other Ambulatory Visit (HOSPITAL_COMMUNITY): Payer: Self-pay

## 2022-05-04 ENCOUNTER — Encounter: Payer: Self-pay | Admitting: Family Medicine

## 2022-05-04 ENCOUNTER — Other Ambulatory Visit (HOSPITAL_COMMUNITY): Payer: Self-pay

## 2022-06-01 ENCOUNTER — Other Ambulatory Visit (HOSPITAL_COMMUNITY): Payer: Self-pay

## 2022-06-01 ENCOUNTER — Other Ambulatory Visit: Payer: Self-pay | Admitting: Family Medicine

## 2022-06-01 MED ORDER — BUPROPION HCL ER (XL) 150 MG PO TB24
150.0000 mg | ORAL_TABLET | Freq: Every day | ORAL | 0 refills | Status: DC
  Filled 2022-06-01: qty 90, 90d supply, fill #0

## 2022-06-15 ENCOUNTER — Other Ambulatory Visit: Payer: Self-pay | Admitting: Family Medicine

## 2022-06-15 ENCOUNTER — Other Ambulatory Visit (HOSPITAL_COMMUNITY): Payer: Self-pay

## 2022-06-15 MED ORDER — PANTOPRAZOLE SODIUM 40 MG PO TBEC
40.0000 mg | DELAYED_RELEASE_TABLET | Freq: Every day | ORAL | 2 refills | Status: DC
  Filled 2022-06-15: qty 90, 90d supply, fill #0
  Filled 2022-09-16: qty 90, 90d supply, fill #1
  Filled 2022-12-21: qty 90, 90d supply, fill #2

## 2022-06-22 ENCOUNTER — Other Ambulatory Visit (HOSPITAL_COMMUNITY): Payer: Self-pay

## 2022-07-08 ENCOUNTER — Other Ambulatory Visit: Payer: Self-pay | Admitting: Family Medicine

## 2022-07-08 DIAGNOSIS — E119 Type 2 diabetes mellitus without complications: Secondary | ICD-10-CM

## 2022-07-09 ENCOUNTER — Other Ambulatory Visit (HOSPITAL_COMMUNITY): Payer: Self-pay

## 2022-07-09 ENCOUNTER — Other Ambulatory Visit: Payer: Self-pay

## 2022-07-09 MED ORDER — DAPAGLIFLOZIN PROPANEDIOL 5 MG PO TABS
5.0000 mg | ORAL_TABLET | Freq: Every day | ORAL | 3 refills | Status: DC
  Filled 2022-07-09: qty 90, 90d supply, fill #0
  Filled 2022-10-03: qty 90, 90d supply, fill #1
  Filled 2022-10-03: qty 30, 30d supply, fill #1
  Filled 2022-11-06: qty 30, 30d supply, fill #2
  Filled 2022-12-21: qty 30, 30d supply, fill #3
  Filled 2023-01-21 (×2): qty 30, 30d supply, fill #4
  Filled 2023-02-26: qty 30, 30d supply, fill #5
  Filled 2023-04-23 (×2): qty 30, 30d supply, fill #6
  Filled 2023-06-01: qty 30, 30d supply, fill #7
  Filled 2023-06-30: qty 30, 30d supply, fill #8

## 2022-07-09 MED ORDER — METFORMIN HCL ER 500 MG PO TB24
ORAL_TABLET | ORAL | 3 refills | Status: DC
  Filled 2022-07-09: qty 270, 90d supply, fill #0
  Filled 2022-10-20: qty 270, 90d supply, fill #1
  Filled 2023-01-21 (×2): qty 270, 90d supply, fill #2
  Filled 2023-04-23 (×2): qty 270, 90d supply, fill #3

## 2022-07-25 ENCOUNTER — Other Ambulatory Visit (HOSPITAL_COMMUNITY): Payer: Self-pay

## 2022-07-29 ENCOUNTER — Ambulatory Visit (INDEPENDENT_AMBULATORY_CARE_PROVIDER_SITE_OTHER): Payer: 59 | Admitting: Family Medicine

## 2022-07-29 ENCOUNTER — Encounter: Payer: Self-pay | Admitting: Family Medicine

## 2022-07-29 VITALS — BP 105/69 | HR 83 | Ht 70.5 in | Wt 192.0 lb

## 2022-07-29 DIAGNOSIS — Z125 Encounter for screening for malignant neoplasm of prostate: Secondary | ICD-10-CM

## 2022-07-29 DIAGNOSIS — E039 Hypothyroidism, unspecified: Secondary | ICD-10-CM | POA: Diagnosis not present

## 2022-07-29 DIAGNOSIS — E119 Type 2 diabetes mellitus without complications: Secondary | ICD-10-CM

## 2022-07-29 DIAGNOSIS — G7000 Myasthenia gravis without (acute) exacerbation: Secondary | ICD-10-CM | POA: Diagnosis not present

## 2022-07-29 DIAGNOSIS — E781 Pure hyperglyceridemia: Secondary | ICD-10-CM

## 2022-07-29 DIAGNOSIS — I10 Essential (primary) hypertension: Secondary | ICD-10-CM | POA: Diagnosis not present

## 2022-07-29 NOTE — Assessment & Plan Note (Signed)
Recheck TSH 

## 2022-07-29 NOTE — Progress Notes (Signed)
Mark Black - 57 y.o. male MRN [id number]  Date of birth: [birth year]  Subjective Chief Complaint  Patient presents with   Follow-up    HPI Mark Black is a 57 year old male here today for follow-up visit.  Reports he is doing well at this time.  Denies new concerns at this time.  Continues on combination of amlodipine/benazepril and bisoprolol/hydrochlorothiazide for management of hypertension.  He is doing well with current medications and denies side effects.  He has not had chest pain, shortness of breath, palpitations, headaches or vision changes.  His diabetes has been managed with Ozempic, Farxiga and metformin.  He is doing well at current strength and medication.  He has remained on steroids for management of myasthenia.  He denies symptoms related to his diabetes at this time.  Remains on levothyroxine for hypothyroidism.  Feels good at current strength of levothyroxine.  Mood remains well-controlled with combination of Pristiq and bupropion.  ROS:  A comprehensive ROS was completed and negative except as noted per HPI  No Known Allergies  Past Medical History:  Diagnosis Date   Allergic rhinitis 12/24/2017   Allergy    Alopecia totalis    Anxiety 12/24/2017   Asthma    as a child   Chicken pox    Diabetes mellitus without complication ([place])    Elevated liver enzymes 10/13/2020   Essential hypertension 02/10/2013   Eyelid abnormality 04/23/2021   GERD (gastroesophageal reflux disease) 01/27/2019   Hypogonadism in male 06/26/2019   Hypothyroidism 02/10/2013   Lower urinary tract symptoms (LUTS) 12/24/2017   Screening for colon cancer 12/24/2017   Suspected COVID-19 virus infection 06/20/2020   T2DM (type 2 diabetes mellitus) ([place]) 03/21/2018   Vitiligo     Past Surgical History:  Procedure Laterality Date   CARPAL TUNNEL RELEASE     Bil   NASAL SINUS SURGERY  1994   cut and open air passages/nostrils    Social History   Socioeconomic History   Marital status:  Married    Spouse name: Not on file   Number of children: 2   Years of education: Not on file   Highest education level: Not on file  Occupational History   Not on file  Tobacco Use   Smoking status: Never   Smokeless tobacco: Never  Vaping Use   Vaping Use: Never used  Substance and Sexual Activity   Alcohol use: Not Currently   Drug use: Not Currently   Sexual activity: Not on file  Other Topics Concern   Not on file  Social History Narrative   Right Handed   Lives in a one story home       Father passed away in his 68's from drowning    Social Determinants of Health   Financial Resource Strain: Not on file  Food Insecurity: Not on file  Transportation Needs: Not on file  Physical Activity: Not on file  Stress: Not on file  Social Connections: Not on file    Family History  Problem Relation Age of Onset   Diabetes Father    Hypertension Father    Diabetes Paternal Aunt    Cancer Maternal Aunt        breast   Diabetes Sister    Hypertension Sister    Diabetes Brother    Colon cancer Neg Hx    Colon polyps Neg Hx    Esophageal cancer Neg Hx    Stomach cancer Neg Hx    Rectal cancer Neg Hx  Health Maintenance  Topic Date Due   Diabetic kidney evaluation - eGFR measurement  07/31/2022   COVID-19 Vaccine (3 - 2023-24 season) 08/13/2022 (Originally 03/13/2022)   Hepatitis C Screening  10/24/2022 (Originally 01/11/1982)   HIV Screening  10/24/2022 (Originally 01/12/1979)   OPHTHALMOLOGY EXAM  10/28/2022 (Originally 05/20/2022)   FOOT EXAM  10/29/2022 (Originally 04/23/2022)   HEMOGLOBIN A1C  10/27/2022   Diabetic kidney evaluation - Urine ACR  04/28/2023   DTaP/Tdap/Td (2 - Td or Tdap) 01/27/2027   COLONOSCOPY (Pts 45-51yr Insurance coverage will need to be confirmed)  05/27/2028   INFLUENZA VACCINE  Completed   Zoster Vaccines- Shingrix  Completed   HPV VACCINES  Aged Out      ----------------------------------------------------------------------------------------------------------------------------------------------------------------------------------------------------------------- Physical Exam BP 105/69   Pulse 83   Ht 5' 10.5" (1.791 m)   Wt 192 lb (87.1 kg)   SpO2 97%   BMI 27.16 kg/m   Physical Exam Constitutional:      Appearance: Normal appearance.  HENT:     Head: Normocephalic and atraumatic.  Eyes:     General: No scleral icterus. Cardiovascular:     Rate and Rhythm: Normal rate and regular rhythm.  Pulmonary:     Effort: Pulmonary effort is normal.     Breath sounds: Normal breath sounds.  Musculoskeletal:     Cervical back: Neck supple.  Neurological:     Mental Status: He is alert.  Psychiatric:        Mood and Affect: Mood normal.        Behavior: Behavior normal.     ------------------------------------------------------------------------------------------------------------------------------------------------------------------------------------------------------------------- Assessment and Plan  Essential hypertension Blood pressure is well-controlled at this time.  Recommend continuation of current medications for management of hypertension.  Diabetes mellitus without complication ([facility] He is doing well with current medications for management of diabetes.  Updated labs ordered.  Checking A1c.  Hypothyroidism Recheck TSH.  Myasthenia gravis without acute exacerbation ([facility] Management per neurology.  Stable at this time.  He will remain on current strength of prednisone..  Hypertriglyceridemia Updated lipid panel ordered.   No orders of the defined types were placed in this encounter.   Return in about 6 months (around 01/27/2023) for HTN/T2DM.    This visit occurred during the SARS-CoV-2 public health emergency.  Safety protocols were in place, including screening questions prior to the visit, additional  usage of staff PPE, and extensive cleaning of exam room while observing appropriate contact time as indicated for disinfecting solutions.

## 2022-07-29 NOTE — Assessment & Plan Note (Signed)
Management per neurology.  Stable at this time.  He will remain on current strength of prednisone.Mark Black

## 2022-07-29 NOTE — Assessment & Plan Note (Signed)
Updated lipid panel ordered.

## 2022-07-29 NOTE — Assessment & Plan Note (Signed)
He is doing well with current medications for management of diabetes.  Updated labs ordered.  Checking A1c.

## 2022-07-29 NOTE — Assessment & Plan Note (Signed)
Blood pressure is well-controlled at this time.  Recommend continuation of current medications for management of hypertension.   

## 2022-08-04 ENCOUNTER — Encounter: Payer: Self-pay | Admitting: Neurology

## 2022-08-04 ENCOUNTER — Ambulatory Visit: Payer: 59 | Admitting: Neurology

## 2022-08-04 VITALS — BP 120/77 | HR 84 | Wt 192.8 lb

## 2022-08-04 DIAGNOSIS — G7001 Myasthenia gravis with (acute) exacerbation: Secondary | ICD-10-CM

## 2022-08-04 NOTE — Progress Notes (Signed)
Follow-up Visit   Date: 08/04/22   Mark Black MRN: [id number] DOB: June 23, 1966   Interim History: Mark Black is a 57 y.o. 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  The patient was accompanied to the clinic by wife who also provides collateral information.     IMPRESSION/PLAN: Seropositive myasthenia gravis without exacerbation, ocular. Thymoma negative.  Diagnosed February 2023.  - Symptoms initially controlled on mestinon prn  - July 2023 worsening ocular symptoms.  Clinically with moderate to severe R ptosis  - Sept 2023 prednisone increased to '25mg'$ /d which improved ptosis, mestinon stopped due to GI side effects  Eye pain is not a feature with myasthenia, so I have recommended that he follow- up with eye doctor.   PLAN/RECOMMENDATIONS:  Reduce prednisone '20mg'$  x 2 week, then reduce to '20mg'$  alternating with '15mg'$  daily  Return to clinic in 2 months    ------------------------------------------- History of present illness: Starting around the fall of 2022, he began having intermittent droopiness of the left eyelid.  It can occur several days of the week and when present, it will be constant all day.  Rest does not seem to make any improvement, as he can wake up with it.  No double vision, difficulty swallowing/talking, jaw fatigue, or limb weakness.  He was seen by his ophthalmologist who referred him for evaluation of myasthenia gravis.    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    UPDATE 08/27/2021:  He is here to discuss the results of his myasthenia panel which was positive for AChR antibody (blocking and modulating).  He continues to have intermittent left ptosis.  No double vision, difficulty swallowing/talking, or limb weakness.    UPDATE 02/11/2022:   For the past month, he has noticed increased droopiness of the right eyelid where it will often completely cover his pupil, impairing his vision.  He has some droopiness of the left eyelid at times and has one episode of double vision.  No recent medication changes, illnesses, or stressors.  He was previously taking mestinon as needed, which resolved his ocular symptoms quickly, however over the past month, he increased mestinon to '60mg'$  TID and does not feel that it has helped.  He reports having stomach cramping and nausea on the higher dose of mestinon and was offered zofran by his ophthalmologist. He denies difficulty swallowing/talking or limb weakness.   UPDATE 04/15/2022:  He is here for follow-up.   Since prednisone was increased to '25mg'$ , he has noticed improved double vision and droopiness of the eyes.  He discontinued mestinon due to GI upset. No problems with speech/swallow or limb weakness.    UPDATE 08/04/2022:  He is here for follow-up visit. He denies droopiness of the eyelids and double vision. He has been on prednisone '25mg'$  and '20mg'$ .  Interestingly, he has noticed regrowth of his eyebrows and hair growth on the scalp. He has noticed episodic sharp pain around the eyes, as if someone was poking the eyes.  No vision changes.    Medications:  Current Outpatient Medications on File Prior to Visit  Medication Sig Dispense Refill   amLODipine-benazepril (LOTREL) 10-40 MG capsule TAKE 1 CAPSULE BY MOUTH ONCE DAILY 90 capsule 3   bisoprolol-hydrochlorothiazide (ZIAC) 5-6.25 MG tablet TAKE 1 TABLET BY MOUTH DAILY. 90 tablet 1   buPROPion (WELLBUTRIN XL) 150 MG 24 hr tablet  Take 1 tablet (150 mg total) by mouth daily. 90 tablet 0   dapagliflozin propanediol (FARXIGA) 5 MG TABS tablet Take 1 tablet (5 mg total) by mouth daily. 90 tablet 3   desvenlafaxine (PRISTIQ) 100 MG 24 hr tablet TAKE 1 TABLET BY MOUTH DAILY. 90 tablet 3   levothyroxine (SYNTHROID) 200 MCG tablet Take 1 tablet (200 mcg total)  by mouth daily before breakfast. 90 tablet 1   metFORMIN (GLUCOPHAGE-XR) 500 MG 24 hr tablet TAKE 3 TABLETS (1,500 MG TOTAL) BY MOUTH DAILY WITH SUPPER. 270 tablet 3   Multiple Vitamins-Minerals (MULTIVITAMIN ADULT PO) Take 1 tablet by mouth daily.     Omega-3 Fatty Acids (FISH OIL) 1000 MG CAPS Take 2 capsules (2,000 mg total) by mouth 2 (two) times daily. 180 capsule 12   predniSONE (DELTASONE) 10 MG tablet Take 2 tablets (with a '5mg'$  tablet ('25mg'$ ))-- alternate with 2 tablets ('20mg'$ ) x 2 weeks-- then take 2 tablets ('20mg'$ ) daily. 75 tablet 3   predniSONE (DELTASONE) 5 MG tablet Take daily as instructed along with '10mg'$  daily. 30 tablet 3   tamsulosin (FLOMAX) 0.4 MG CAPS capsule Take 1 capsule (0.4 mg total) by mouth daily. 90 capsule 3   ondansetron (ZOFRAN) 4 MG tablet Take 1 tablet by mouth as needed. (Patient not taking: Reported on 07/29/2022) 30 tablet 1   pantoprazole (PROTONIX) 40 MG tablet Take 1 tablet (40 mg total) by mouth daily. (Patient not taking: Reported on 07/29/2022) 90 tablet 2   pyridostigmine (MESTINON) 60 MG tablet Take 1 tablet by mouth 3 (three) times daily. (Patient not taking: Reported on 07/29/2022) 90 tablet 11   Semaglutide,0.25 or 0.'5MG'$ /DOS, (OZEMPIC, 0.25 OR 0.5 MG/DOSE,) 2 MG/3ML SOPN Inject 0.'25mg'$  into the skin once weekly for 4 weeks then increase to 0.'5mg'$  weekly (Patient not taking: Reported on 08/04/2022) 3 mL 3   triamcinolone cream (KENALOG) 0.1 % Apply 1 application topically 2 (two) times daily. (Patient not taking: Reported on 08/04/2022) 30 g 0   Current Facility-Administered Medications on File Prior to Visit  Medication Dose Route Frequency Provider Last Rate Last Admin   0.9 %  sodium chloride infusion  500 mL Intravenous Once Armbruster, Carlota Raspberry, MD        Allergies: No Known Allergies  Vital Signs:  BP 120/77   Pulse 84   Wt 192 lb 12.8 oz (87.5 kg)   SpO2 97%   BMI 27.27 kg/m    Neurological Exam: MENTAL STATUS including orientation to time,  place, person, recent and remote memory, attention span and concentration, language, and fund of knowledge is normal.  Speech is not dysarthric.  CRANIAL NERVES:   Left eye is blind.  Normal conjugate, extra-ocular eye movements in all directions of gaze.  No ptosis at rest or with sustained upgaze (improved).  Face is symmetric. Palate elevates symmetrically.  Orbicularis oculi and buccinator is 5/5.  MOTOR:  Motor strength is 5/5 in all extremities, without fatigability.  No atrophy, fasciculations or abnormal movements.  No pronator drift.  Tone is normal.    COORDINATION/GAIT:   Gait narrow based and stable.   Data: Myasthenia gravis panel 08/13/2021:  AChR blocking 40*, modulating 79*  Lab Results  Component Value Date   HGBA1C 8.7 (A) 04/27/2022   CT chest wwo contrast 09/12/2021:  Negative for thymoma   Thank you for allowing me to participate in patient's care.  If I can answer any additional questions, I would be pleased to do so.    Sincerely,  Linzee Depaul K. Posey Pronto, DO

## 2022-08-04 NOTE — Patient Instructions (Addendum)
Reduce prednisone '20mg'$  x 2 weeks, then reduce to '20mg'$  alternating with '15mg'$  daily  Please see your eye doctor for eye pain  I will see you back in 2 months

## 2022-08-26 ENCOUNTER — Encounter: Payer: Self-pay | Admitting: Neurology

## 2022-08-31 ENCOUNTER — Other Ambulatory Visit: Payer: Self-pay | Admitting: Family Medicine

## 2022-09-01 ENCOUNTER — Other Ambulatory Visit (HOSPITAL_COMMUNITY): Payer: Self-pay

## 2022-09-01 ENCOUNTER — Other Ambulatory Visit: Payer: Self-pay

## 2022-09-01 MED ORDER — BUPROPION HCL ER (XL) 150 MG PO TB24
150.0000 mg | ORAL_TABLET | Freq: Every day | ORAL | 0 refills | Status: DC
  Filled 2022-09-01: qty 90, 90d supply, fill #0

## 2022-09-02 DIAGNOSIS — E039 Hypothyroidism, unspecified: Secondary | ICD-10-CM | POA: Diagnosis not present

## 2022-09-02 DIAGNOSIS — E119 Type 2 diabetes mellitus without complications: Secondary | ICD-10-CM | POA: Diagnosis not present

## 2022-09-02 DIAGNOSIS — Z125 Encounter for screening for malignant neoplasm of prostate: Secondary | ICD-10-CM | POA: Diagnosis not present

## 2022-09-02 DIAGNOSIS — E781 Pure hyperglyceridemia: Secondary | ICD-10-CM | POA: Diagnosis not present

## 2022-09-02 DIAGNOSIS — I10 Essential (primary) hypertension: Secondary | ICD-10-CM | POA: Diagnosis not present

## 2022-09-03 ENCOUNTER — Other Ambulatory Visit: Payer: Self-pay | Admitting: Family Medicine

## 2022-09-03 DIAGNOSIS — E039 Hypothyroidism, unspecified: Secondary | ICD-10-CM

## 2022-09-03 LAB — COMPLETE METABOLIC PANEL WITH GFR
AG Ratio: 2.1 (calc) (ref 1.0–2.5)
ALT: 34 U/L (ref 9–46)
AST: 26 U/L (ref 10–35)
Albumin: 4.7 g/dL (ref 3.6–5.1)
Alkaline phosphatase (APISO): 48 U/L (ref 35–144)
BUN: 18 mg/dL (ref 7–25)
CO2: 30 mmol/L (ref 20–32)
Calcium: 9.7 mg/dL (ref 8.6–10.3)
Chloride: 100 mmol/L (ref 98–110)
Creat: 0.78 mg/dL (ref 0.70–1.30)
Globulin: 2.2 g/dL (calc) (ref 1.9–3.7)
Glucose, Bld: 222 mg/dL — ABNORMAL HIGH (ref 65–99)
Potassium: 3.9 mmol/L (ref 3.5–5.3)
Sodium: 140 mmol/L (ref 135–146)
Total Bilirubin: 1 mg/dL (ref 0.2–1.2)
Total Protein: 6.9 g/dL (ref 6.1–8.1)
eGFR: 103 mL/min/{1.73_m2} (ref >=60)

## 2022-09-03 LAB — LIPID PANEL W/REFLEX DIRECT LDL
Cholesterol: 123 mg/dL (ref <200)
HDL: 48 mg/dL (ref >=40)
LDL Cholesterol (Calc): 46 mg/dL (calc)
Non-HDL Cholesterol (Calc): 75 mg/dL (calc) (ref <130)
Total CHOL/HDL Ratio: 2.6 (calc) (ref <5.0)
Triglycerides: 250 mg/dL — ABNORMAL HIGH (ref <150)

## 2022-09-03 LAB — CBC WITH DIFFERENTIAL/PLATELET
Absolute Monocytes: 462 cells/uL (ref 200–950)
Basophils Absolute: 20 cells/uL (ref 0–200)
Basophils Relative: 0.3 %
Eosinophils Absolute: 47 cells/uL (ref 15–500)
Eosinophils Relative: 0.7 %
HCT: 47.3 % (ref 38.5–50.0)
Hemoglobin: 16.6 g/dL (ref 13.2–17.1)
Lymphs Abs: 2345 cells/uL (ref 850–3900)
MCH: 32.4 pg (ref 27.0–33.0)
MCHC: 35.1 g/dL (ref 32.0–36.0)
MCV: 92.2 fL (ref 80.0–100.0)
MPV: 8.9 fL (ref 7.5–12.5)
Monocytes Relative: 6.9 %
Neutro Abs: 3826 cells/uL (ref 1500–7800)
Neutrophils Relative %: 57.1 %
Platelets: 182 10*3/uL (ref 140–400)
RBC: 5.13 10*6/uL (ref 4.20–5.80)
RDW: 13.2 % (ref 11.0–15.0)
Total Lymphocyte: 35 %
WBC: 6.7 10*3/uL (ref 3.8–10.8)

## 2022-09-03 LAB — HEMOGLOBIN A1C
Hgb A1c MFr Bld: 10 % of total Hgb — ABNORMAL HIGH (ref <5.7)
Mean Plasma Glucose: 240 mg/dL
eAG (mmol/L): 13.3 mmol/L

## 2022-09-03 LAB — TSH: TSH: 0.31 mIU/L — ABNORMAL LOW (ref 0.40–4.50)

## 2022-09-03 LAB — PSA: PSA: 0.51 ng/mL (ref <=4.00)

## 2022-09-15 ENCOUNTER — Encounter: Payer: Self-pay | Admitting: Family Medicine

## 2022-09-15 ENCOUNTER — Other Ambulatory Visit (HOSPITAL_COMMUNITY): Payer: Self-pay

## 2022-09-17 ENCOUNTER — Other Ambulatory Visit (HOSPITAL_COMMUNITY): Payer: Self-pay

## 2022-09-17 ENCOUNTER — Other Ambulatory Visit: Payer: Self-pay

## 2022-09-17 MED ORDER — OZEMPIC (0.25 OR 0.5 MG/DOSE) 2 MG/3ML ~~LOC~~ SOPN
PEN_INJECTOR | SUBCUTANEOUS | 3 refills | Status: DC
  Filled 2022-09-17: qty 3, 30d supply, fill #0
  Filled 2022-11-30: qty 3, 30d supply, fill #1
  Filled 2023-01-21 (×2): qty 3, 30d supply, fill #2
  Filled 2023-02-26: qty 3, 30d supply, fill #3

## 2022-09-19 ENCOUNTER — Other Ambulatory Visit (HOSPITAL_COMMUNITY): Payer: Self-pay

## 2022-09-28 ENCOUNTER — Other Ambulatory Visit: Payer: Self-pay | Admitting: Family Medicine

## 2022-09-29 ENCOUNTER — Other Ambulatory Visit (HOSPITAL_COMMUNITY): Payer: Self-pay

## 2022-09-29 MED ORDER — BUPROPION HCL ER (XL) 150 MG PO TB24
150.0000 mg | ORAL_TABLET | Freq: Every day | ORAL | 0 refills | Status: DC
  Filled 2022-09-29 – 2022-11-30 (×3): qty 90, 90d supply, fill #0

## 2022-09-30 ENCOUNTER — Other Ambulatory Visit (HOSPITAL_COMMUNITY): Payer: Self-pay

## 2022-10-01 ENCOUNTER — Other Ambulatory Visit (HOSPITAL_COMMUNITY): Payer: Self-pay

## 2022-10-01 ENCOUNTER — Other Ambulatory Visit: Payer: Self-pay | Admitting: Family Medicine

## 2022-10-03 ENCOUNTER — Other Ambulatory Visit (HOSPITAL_COMMUNITY): Payer: Self-pay

## 2022-10-05 ENCOUNTER — Telehealth: Payer: Self-pay | Admitting: Anesthesiology

## 2022-10-05 ENCOUNTER — Other Ambulatory Visit (HOSPITAL_COMMUNITY): Payer: Self-pay

## 2022-10-05 ENCOUNTER — Ambulatory Visit: Payer: 59 | Admitting: Neurology

## 2022-10-05 ENCOUNTER — Encounter: Payer: Self-pay | Admitting: Neurology

## 2022-10-05 VITALS — BP 130/82 | HR 102 | Ht 70.5 in | Wt 197.0 lb

## 2022-10-05 DIAGNOSIS — G7001 Myasthenia gravis with (acute) exacerbation: Secondary | ICD-10-CM

## 2022-10-05 MED ORDER — CALCIUM CARBONATE 600 MG PO TABS
600.0000 mg | ORAL_TABLET | Freq: Two times a day (BID) | ORAL | 5 refills | Status: DC
  Filled 2022-10-05: qty 60, 30d supply, fill #0

## 2022-10-05 MED ORDER — PREDNISONE 10 MG PO TABS
10.0000 mg | ORAL_TABLET | Freq: Every day | ORAL | 1 refills | Status: DC
  Filled 2022-10-05: qty 90, 90d supply, fill #0

## 2022-10-05 MED ORDER — BISOPROLOL-HYDROCHLOROTHIAZIDE 5-6.25 MG PO TABS
1.0000 | ORAL_TABLET | Freq: Every day | ORAL | 1 refills | Status: DC
  Filled 2022-10-05 – 2022-11-03 (×2): qty 90, 90d supply, fill #0
  Filled 2023-01-30: qty 90, 90d supply, fill #1

## 2022-10-05 NOTE — Patient Instructions (Addendum)
It was great to see you today!  Continue prednisone 10mg  daily  Start calcium supplement   I will see you back in August, or sooner as needed

## 2022-10-05 NOTE — Progress Notes (Signed)
Follow-up Visit   Date: 10/05/22   Mark Black MRN: [id number] DOB: 1966-02-03   Interim History: Mark Black is a 57 y.o. 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  The patient was accompanied to the clinic by self.   IMPRESSION/PLAN: Seropositive ocular myasthenia gravis without exacerbation (08/2021). Thymoma negative.  Clinically, doing great on tapering dose of prednisone.    - Symptoms initially controlled on mestinon prn  - July 2023 worsening ocular symptoms.  Clinically with moderate to severe R ptosis  - Sept 2023 prednisone increased to 25mg /d which improved ptosis, mestinon stopped due to GI side effects  - Jan 2024, started prednisone taper   PLAN/RECOMMENDATIONS:  Continue prednisone 10mg  daily, plan to taper in the fall Start calcium supplement  Return to clinic in 5 months   ------------------------------------------- History of present illness: Starting around the fall of 2022, he began having intermittent droopiness of the left eyelid.  It can occur several days of the week and when present, it will be constant all day.  Rest does not seem to make any improvement, as he can wake up with it.  No double vision, difficulty swallowing/talking, jaw fatigue, or limb weakness.  He was seen by his ophthalmologist who referred him for evaluation of myasthenia gravis.    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    UPDATE 02/11/2022:  For the past month, he has noticed increased droopiness of the right eyelid where it will often completely cover his pupil, impairing his vision.  He has some droopiness of the left eyelid at times and has one episode of double vision.  No  recent medication changes, illnesses, or stressors.  He was previously taking mestinon as needed, which resolved his ocular symptoms quickly, however over the past month, he increased mestinon to 60mg  TID and does not feel that it has helped.  He reports having stomach cramping and nausea on the higher dose of mestinon and was offered zofran by his ophthalmologist. He denies difficulty swallowing/talking or limb weakness.   UPDATE 04/15/2022:  He is here for follow-up.   Since prednisone was increased to 25mg , he has noticed improved double vision and droopiness of the eyes.  He discontinued mestinon due to GI upset. No problems with speech/swallow or limb weakness.    UPDATE 08/04/2022:   He denies droopiness of the eyelids and double vision. He has been on prednisone 25mg  and 20mg .  Interestingly, he has noticed regrowth of his eyebrows and hair growth on the scalp. He has noticed episodic sharp pain around the eyes, as if someone was poking the eyes.    UPDATE 10/05/2022:  He is here for follow-up visit.  He has been able to taper prednisone to 10mg  daily and tolerating this well. No ptosis or double vision.  No new complaints.   Medications:  Current Outpatient Medications on File Prior to Visit  Medication Sig Dispense Refill   amLODipine-benazepril (LOTREL) 10-40 MG capsule TAKE 1 CAPSULE BY MOUTH ONCE DAILY 90 capsule 3   bisoprolol-hydrochlorothiazide (ZIAC) 5-6.25 MG tablet TAKE 1 TABLET BY MOUTH DAILY. 90 tablet 1   buPROPion (WELLBUTRIN XL) 150 MG 24 hr tablet Take 1 tablet by mouth daily. 90 tablet 0   dapagliflozin propanediol (FARXIGA) 5 MG TABS tablet  Take 1 tablet (5 mg total) by mouth daily. 90 tablet 3   desvenlafaxine (PRISTIQ) 100 MG 24 hr tablet TAKE 1 TABLET BY MOUTH DAILY. 90 tablet 3   levothyroxine (SYNTHROID) 200 MCG tablet Take 1 tablet (200 mcg total) by mouth daily before breakfast. 90 tablet 1   metFORMIN (GLUCOPHAGE-XR) 500 MG 24 hr tablet TAKE 3 TABLETS (1,500 MG TOTAL)  BY MOUTH DAILY WITH SUPPER. 270 tablet 3   Multiple Vitamins-Minerals (MULTIVITAMIN ADULT PO) Take 1 tablet by mouth daily.     Omega-3 Fatty Acids (FISH OIL) 1000 MG CAPS Take 2 capsules (2,000 mg total) by mouth 2 (two) times daily. 180 capsule 12   ondansetron (ZOFRAN) 4 MG tablet Take 1 tablet by mouth as needed. 30 tablet 1   pantoprazole (PROTONIX) 40 MG tablet Take 1 tablet (40 mg total) by mouth daily. 90 tablet 2   predniSONE (DELTASONE) 10 MG tablet Take 2 tablets (with a 5mg  tablet (25mg ))-- alternate with 2 tablets (20mg ) x 2 weeks-- then take 2 tablets (20mg ) daily. 75 tablet 3   predniSONE (DELTASONE) 5 MG tablet Take daily as instructed along with 10mg  daily. 30 tablet 3   pyridostigmine (MESTINON) 60 MG tablet Take 1 tablet by mouth 3 (three) times daily. 90 tablet 11   Semaglutide,0.25 or 0.5MG /DOS, (OZEMPIC, 0.25 OR 0.5 MG/DOSE,) 2 MG/3ML SOPN Inject 0.25mg  into the skin once weekly for 4 weeks then increase to 0.5mg  weekly 3 mL 3   tamsulosin (FLOMAX) 0.4 MG CAPS capsule Take 1 capsule (0.4 mg total) by mouth daily. 90 capsule 3   triamcinolone cream (KENALOG) 0.1 % Apply 1 application topically 2 (two) times daily. 30 g 0   Current Facility-Administered Medications on File Prior to Visit  Medication Dose Route Frequency Provider Last Rate Last Admin   0.9 %  sodium chloride infusion  500 mL Intravenous Once Armbruster, Carlota Raspberry, MD        Allergies: No Known Allergies  Vital Signs:  BP 130/82   Pulse (!) 102   Ht 5' 10.5" (1.791 m)   Wt 197 lb (89.4 kg)   SpO2 97%   BMI 27.87 kg/m    Neurological Exam: MENTAL STATUS including orientation to time, place, person, recent and remote memory, attention span and concentration, language, and fund of knowledge is normal.  Speech is not dysarthric.  CRANIAL NERVES:   Left eye is blind.  Normal conjugate, extra-ocular eye movements in all directions of gaze.  No ptosis at rest or with sustained upgaze.  Face is symmetric.  Palate elevates symmetrically.  Orbicularis oculi and buccinator is 5/5.  MOTOR:  Motor strength is 5/5 in all extremities, without fatigability.  No atrophy, fasciculations or abnormal movements.  No pronator drift.  Tone is normal.    COORDINATION/GAIT:   Gait narrow based and stable.   Data: Myasthenia gravis panel 08/13/2021:  AChR blocking 40*, modulating 79*  Lab Results  Component Value Date   HGBA1C 10.0 (H) 09/02/2022   CT chest wwo contrast 09/12/2021:  Negative for thymoma   Thank you for allowing me to participate in patient's care.  If I can answer any additional questions, I would be pleased to do so.    Sincerely,    Donley Harland K. Posey Pronto, DO

## 2022-10-05 NOTE — Telephone Encounter (Signed)
Steele called requesting directions on how to take prednisone prescription.

## 2022-10-06 ENCOUNTER — Other Ambulatory Visit (HOSPITAL_COMMUNITY): Payer: Self-pay

## 2022-10-06 MED ORDER — PREDNISONE 10 MG PO TABS
10.0000 mg | ORAL_TABLET | Freq: Every day | ORAL | 1 refills | Status: DC
  Filled 2022-10-06: qty 90, 90d supply, fill #0
  Filled 2022-10-29: qty 90, 90d supply, fill #1

## 2022-10-06 NOTE — Telephone Encounter (Signed)
Rx corrected and resent.  Take prednisone 10mg  daily.

## 2022-10-07 ENCOUNTER — Telehealth: Payer: Self-pay

## 2022-10-07 NOTE — Telephone Encounter (Addendum)
Initiated Prior authorization [id number] (0.25 or 0.5 MG/DOSE) 2MG /3ML pen-injectors Via: Covermymeds Case/Key:[id number] Status: approved  as of 10/07/22 Reason:Authorization Expiration Date: October 07, 2023. Notified Pt via: Mychart, called pt to inform

## 2022-10-09 ENCOUNTER — Other Ambulatory Visit (HOSPITAL_COMMUNITY): Payer: Self-pay

## 2022-10-13 ENCOUNTER — Other Ambulatory Visit (HOSPITAL_COMMUNITY): Payer: Self-pay

## 2022-10-17 ENCOUNTER — Other Ambulatory Visit (HOSPITAL_COMMUNITY): Payer: Self-pay

## 2022-10-20 ENCOUNTER — Other Ambulatory Visit (HOSPITAL_COMMUNITY): Payer: Self-pay

## 2022-10-21 ENCOUNTER — Other Ambulatory Visit (HOSPITAL_COMMUNITY): Payer: Self-pay

## 2022-10-22 ENCOUNTER — Encounter: Payer: Self-pay | Admitting: Neurology

## 2022-10-27 ENCOUNTER — Other Ambulatory Visit (HOSPITAL_COMMUNITY): Payer: Self-pay

## 2022-10-29 ENCOUNTER — Other Ambulatory Visit: Payer: Self-pay | Admitting: Family Medicine

## 2022-10-29 ENCOUNTER — Other Ambulatory Visit (HOSPITAL_COMMUNITY): Payer: Self-pay

## 2022-10-29 MED ORDER — LEVOTHYROXINE SODIUM 200 MCG PO TABS
200.0000 ug | ORAL_TABLET | Freq: Every day | ORAL | 0 refills | Status: DC
  Filled 2022-10-29: qty 30, 30d supply, fill #0

## 2022-10-30 ENCOUNTER — Other Ambulatory Visit (HOSPITAL_COMMUNITY): Payer: Self-pay

## 2022-11-03 ENCOUNTER — Other Ambulatory Visit (HOSPITAL_COMMUNITY): Payer: Self-pay

## 2022-11-03 ENCOUNTER — Other Ambulatory Visit: Payer: Self-pay | Admitting: Family Medicine

## 2022-11-03 ENCOUNTER — Other Ambulatory Visit: Payer: Self-pay

## 2022-11-03 MED ORDER — TAMSULOSIN HCL 0.4 MG PO CAPS
0.4000 mg | ORAL_CAPSULE | Freq: Every day | ORAL | 3 refills | Status: DC
  Filled 2022-11-03: qty 90, 90d supply, fill #0
  Filled 2023-01-30: qty 90, 90d supply, fill #1
  Filled 2023-06-01: qty 90, 90d supply, fill #2
  Filled 2023-09-16: qty 90, 90d supply, fill #3

## 2022-11-10 ENCOUNTER — Other Ambulatory Visit (HOSPITAL_COMMUNITY): Payer: Self-pay

## 2022-11-25 ENCOUNTER — Encounter: Payer: Self-pay | Admitting: Neurology

## 2022-11-25 ENCOUNTER — Ambulatory Visit (INDEPENDENT_AMBULATORY_CARE_PROVIDER_SITE_OTHER): Payer: BC Managed Care – PPO | Admitting: Neurology

## 2022-11-25 ENCOUNTER — Other Ambulatory Visit (HOSPITAL_COMMUNITY): Payer: Self-pay

## 2022-11-25 ENCOUNTER — Other Ambulatory Visit: Payer: Self-pay

## 2022-11-25 VITALS — BP 117/77 | HR 87 | Ht 70.5 in | Wt 195.0 lb

## 2022-11-25 DIAGNOSIS — G7001 Myasthenia gravis with (acute) exacerbation: Secondary | ICD-10-CM

## 2022-11-25 MED ORDER — PYRIDOSTIGMINE BROMIDE 60 MG PO TABS
ORAL_TABLET | ORAL | 3 refills | Status: DC
  Filled 2022-11-25: qty 50, 33d supply, fill #0

## 2022-11-25 MED ORDER — PREDNISONE 10 MG PO TABS
25.0000 mg | ORAL_TABLET | Freq: Every day | ORAL | 1 refills | Status: DC
  Filled 2022-11-25 – 2022-12-05 (×2): qty 115, 46d supply, fill #0
  Filled 2023-01-30: qty 115, 46d supply, fill #1

## 2022-11-25 NOTE — Progress Notes (Unsigned)
Follow-up Visit   Date: 11/25/22   Mark Black MRN: [id number] DOB: 05-16-1966   Interim History: Mark Black is a 57 y.o. 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  The patient was accompanied to the clinic by self.   IMPRESSION/PLAN: Seropositive myasthenia gravis with exacerbation (diagnosed 08/2021). Thymoma negative.  Clinically, he was doing very well on prednisone 10mg /d, however over the past 1-2 months, he slowly started having worsening ptosis, diplopia, dysphagia, and generalized weakness.  Symptoms have not improved with titrating prednisone to 20mg  and today, he has near complete closure of the left eye and moderate ptosis on the right, with moderate ophthalmoplegia.  MGFA classification IIa.   MG history:  - 08/2021 presented with ptosis and diplopia, symptoms initially controlled on mestinon prn  - July 2023 worsening ocular symptoms.  Started prednisone.  - Sept 2023 prednisone increased to 25mg /d which improved ptosis, mestinon stopped due to GI side effects  - Jan 2024, started prednisone taper  - April 2024, worsening generalized symptoms   PLAN/RECOMMENDATIONS:  Increase prednisone to 25mg  daily. He has poorly controlled diabetes and although I would like to increase prednisone 30mg , it is going to cause worsening hyperglycemia.  Therefore, I will increase prednisone slightly from 20mg  >> 25mg  and start PA for Vyvgart to allow tapering of prednisone I have asked him to monitor blood glucose closely  Start mestinon 30mg  three times daily (7a, noon, and 4pm)  Return to clinic in 3 weeks   ------------------------------------------- History of present illness: Starting around the fall of 2022, he began having intermittent droopiness of the left eyelid.  It can occur several days of  the week and when present, it will be constant all day.  Rest does not seem to make any improvement, as he can wake up with it.  No double vision, difficulty swallowing/talking, jaw fatigue, or limb weakness.  He was seen by his ophthalmologist who referred him for evaluation of myasthenia gravis.    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    UPDATE 11/25/2022:  He was last seen in March at which time MG was very well controlled on prednisone 10mg  daily.  Unfortunately, over the past month, he reports generalized weakness, worsening double vision and droopiness of the eyelids.  His eyelids get very droopy, especially on the left where it can remain completely closed.  His right eyelid is not as severe. Double vision improved with closing one eye.   He has jaw fatigue and intermittent difficulty with swallowing.  No shortness of breath or slurred speech.  He increased prednisone to 20mg  daily but did not appreciate any benefit.  His HbA1c is 10 and is his currently not monitoring blood glucose.  He was recently started on Ozepmic.  No recent illnesses.   Medications:  Current Outpatient Medications on File Prior to Visit  Medication Sig Dispense Refill   amLODipine-benazepril (LOTREL) 10-40 MG capsule TAKE 1 CAPSULE BY MOUTH ONCE DAILY 90 capsule 3   bisoprolol-hydrochlorothiazide (ZIAC) 5-6.25 MG tablet Take 1 tablet by mouth daily. 90 tablet 1   buPROPion (WELLBUTRIN XL) 150 MG 24 hr tablet Take 1 tablet by mouth daily. 90 tablet 0   calcium carbonate (OS-CAL) 600 MG TABS tablet Take 1 tablet (600 mg total) by  mouth 2 (two) times daily with a meal. 60 tablet 5   dapagliflozin propanediol (FARXIGA) 5 MG TABS tablet Take 1 tablet (5 mg total) by mouth daily. 90 tablet 3   desvenlafaxine (PRISTIQ) 100 MG 24 hr tablet TAKE 1 TABLET BY MOUTH DAILY. 90 tablet 3   levothyroxine (SYNTHROID)  200 MCG tablet Take 1 tablet by mouth daily before breakfast. Needs lab work. 30 tablet 0   metFORMIN (GLUCOPHAGE-XR) 500 MG 24 hr tablet TAKE 3 TABLETS (1,500 MG TOTAL) BY MOUTH DAILY WITH SUPPER. 270 tablet 3   Multiple Vitamins-Minerals (MULTIVITAMIN ADULT PO) Take 1 tablet by mouth daily.     Omega-3 Fatty Acids (FISH OIL) 1000 MG CAPS Take 2 capsules (2,000 mg total) by mouth 2 (two) times daily. 180 capsule 12   ondansetron (ZOFRAN) 4 MG tablet Take 1 tablet by mouth as needed. 30 tablet 1   pantoprazole (PROTONIX) 40 MG tablet Take 1 tablet (40 mg total) by mouth daily. 90 tablet 2   predniSONE (DELTASONE) 10 MG tablet Take 1 tablet (10 mg total) by mouth daily with breakfast. (Patient taking differently: Take 20 mg by mouth daily with breakfast.) 90 tablet 1   Semaglutide,0.25 or 0.5MG /DOS, (OZEMPIC, 0.25 OR 0.5 MG/DOSE,) 2 MG/3ML SOPN Inject 0.25mg  into the skin once weekly for 4 weeks then increase to 0.5mg  weekly 3 mL 3   tamsulosin (FLOMAX) 0.4 MG CAPS capsule Take 1 capsule (0.4 mg total) by mouth daily. 90 capsule 3   triamcinolone cream (KENALOG) 0.1 % Apply 1 application topically 2 (two) times daily. 30 g 0   Current Facility-Administered Medications on File Prior to Visit  Medication Dose Route Frequency Provider Last Rate Last Admin   0.9 %  sodium chloride infusion  500 mL Intravenous Once Armbruster, Willaim Rayas, MD        Allergies: No Known Allergies  Vital Signs:  BP 117/77   Pulse 87   Ht 5' 10.5" (1.791 m)   Wt 195 lb (88.5 kg)   SpO2 95%   BMI 27.58 kg/m    Neurological Exam: MENTAL STATUS including orientation to time, place, person, recent and remote memory, attention span and concentration, language, and fund of knowledge is normal.  Speech is not dysarthric.  CRANIAL NERVES:   Left eye is blind.  Opthalmoplegia of the left eye with lateral gaze and upgaze, right extraocular muscles are limited at end-gaze only. Moderate right ptosis and severe left ptosis,  worse with sustained upgaze.  Cogen's twitch is present.  Face is symmetric. Palate elevates symmetrically.  Orbicularis oculi and buccinator is 4/5.  MOTOR:  Motor strength is 5-/5 in all extremities, without fatigability.  No atrophy, fasciculations or abnormal movements.  No pronator drift.  Tone is normal.    COORDINATION/GAIT:   Gait narrow based and stable.   Data: Myasthenia gravis panel 08/13/2021:  AChR blocking 40*, modulating 79*  Lab Results  Component Value Date   HGBA1C 10.0 (H) 09/02/2022   CT chest wwo contrast 09/12/2021:  Negative for thymoma  Total time spent reviewing records, interview, history/exam, documentation, and coordination of care on day of encounter:  45 min    Thank you for allowing me to participate in patient's care.  If I can answer any additional questions, I would be pleased to do so.    Sincerely,    Jhalen Eley K. Allena Katz, DO

## 2022-11-25 NOTE — Patient Instructions (Addendum)
Start prednisone 25mg  daily  Start mestinon 30mg  three times daily (7a, noon, and 4pm)  We will also start prior authorization for Vyvgart  Start monitoring your blood sugars  Follow-up in 3 weeks

## 2022-11-26 ENCOUNTER — Encounter: Payer: Self-pay | Admitting: Family Medicine

## 2022-11-26 ENCOUNTER — Other Ambulatory Visit (HOSPITAL_COMMUNITY): Payer: Self-pay

## 2022-11-26 ENCOUNTER — Other Ambulatory Visit: Payer: Self-pay | Admitting: Pharmacy Technician

## 2022-11-26 DIAGNOSIS — E119 Type 2 diabetes mellitus without complications: Secondary | ICD-10-CM

## 2022-11-26 MED ORDER — ONETOUCH ULTRA VI STRP
ORAL_STRIP | 99 refills | Status: DC
Start: 2022-11-26 — End: 2023-08-06
  Filled 2022-11-26: qty 100, 33d supply, fill #0

## 2022-11-26 MED ORDER — ONETOUCH ULTRA VI STRP
ORAL_STRIP | 99 refills | Status: DC
  Filled 2022-11-26: qty 100, fill #0

## 2022-11-26 MED ORDER — ONETOUCH ULTRA MINI W/DEVICE KIT
PACK | 0 refills | Status: DC
  Filled 2022-11-26: qty 1, fill #0

## 2022-11-26 MED ORDER — FREESTYLE LITE W/DEVICE KIT
PACK | 0 refills | Status: DC
Start: 2022-11-26 — End: 2023-06-15
  Filled 2022-11-26: qty 1, 30d supply, fill #0

## 2022-11-30 ENCOUNTER — Other Ambulatory Visit (HOSPITAL_COMMUNITY): Payer: Self-pay

## 2022-11-30 ENCOUNTER — Other Ambulatory Visit: Payer: Self-pay | Admitting: Family Medicine

## 2022-11-30 ENCOUNTER — Other Ambulatory Visit: Payer: Self-pay

## 2022-11-30 ENCOUNTER — Encounter: Payer: Self-pay | Admitting: Neurology

## 2022-11-30 DIAGNOSIS — E039 Hypothyroidism, unspecified: Secondary | ICD-10-CM

## 2022-11-30 DIAGNOSIS — F419 Anxiety disorder, unspecified: Secondary | ICD-10-CM

## 2022-12-01 ENCOUNTER — Other Ambulatory Visit (HOSPITAL_COMMUNITY): Payer: Self-pay

## 2022-12-01 ENCOUNTER — Telehealth: Payer: Self-pay | Admitting: Pharmacy Technician

## 2022-12-01 MED ORDER — LEVOTHYROXINE SODIUM 200 MCG PO TABS
200.0000 ug | ORAL_TABLET | Freq: Every day | ORAL | 1 refills | Status: DC
Start: 2022-12-01 — End: 2023-01-30
  Filled 2022-12-01: qty 30, 30d supply, fill #0
  Filled 2022-12-31: qty 30, 30d supply, fill #1

## 2022-12-01 MED ORDER — DESVENLAFAXINE SUCCINATE ER 100 MG PO TB24
ORAL_TABLET | Freq: Every day | ORAL | 0 refills | Status: DC
Start: 2022-12-01 — End: 2023-03-12
  Filled 2022-12-01: qty 90, 90d supply, fill #0

## 2022-12-01 NOTE — Telephone Encounter (Signed)
Dr. Allena Katz, [facility] note:  Patient will be scheduled as soon as possible.  Auth Submission: APPROVED Site of care: Site of care: [facility] Payer: BCBS Medication & CPT/J Code(s) submitted:  VYVGART HYTRULO - [id number] Route of submission (phone, fax, portal):  Phone # Fax # Auth type: Buy/Bill Units/visits requested: 4 DOSES (8064 UNITS) Reference number: [id number] Approval from: 11/30/22 to 05/29/23    Co-pay card: Pending [employer] has been notified.

## 2022-12-02 ENCOUNTER — Other Ambulatory Visit (HOSPITAL_COMMUNITY): Payer: Self-pay

## 2022-12-02 ENCOUNTER — Encounter: Payer: Self-pay | Admitting: Neurology

## 2022-12-05 ENCOUNTER — Other Ambulatory Visit (HOSPITAL_COMMUNITY): Payer: Self-pay

## 2022-12-08 ENCOUNTER — Encounter: Payer: Self-pay | Admitting: Neurology

## 2022-12-10 ENCOUNTER — Other Ambulatory Visit: Payer: Self-pay | Admitting: Family Medicine

## 2022-12-10 ENCOUNTER — Ambulatory Visit (INDEPENDENT_AMBULATORY_CARE_PROVIDER_SITE_OTHER): Payer: BC Managed Care – PPO

## 2022-12-10 VITALS — BP 108/70 | HR 87 | Temp 98.0°F | Resp 18 | Ht 70.0 in | Wt 194.0 lb

## 2022-12-10 DIAGNOSIS — F419 Anxiety disorder, unspecified: Secondary | ICD-10-CM

## 2022-12-10 DIAGNOSIS — G7000 Myasthenia gravis without (acute) exacerbation: Secondary | ICD-10-CM | POA: Diagnosis not present

## 2022-12-10 MED ORDER — EFGARTIGIMOD ALFA-HYALUR-QVFC 180-2000 MG-UNIT/ML ~~LOC~~ SOLN
1008.0000 mg | Freq: Once | SUBCUTANEOUS | Status: AC
  Administered 2022-12-10: 1008 mg via SUBCUTANEOUS
  Filled 2022-12-10: qty 5.6

## 2022-12-10 NOTE — Patient Instructions (Signed)
Efgartigimod Alfa Injection What is this medication? EFGARTIGIMOD ALFA (ef gar TIG i mod AL fa) treats myasthenia gravis, a condition that causes muscles to easily weaken or fatigue. It works by decreasing muscle weakness and loss of movement. This medicine may be used for other purposes; ask your health care provider or pharmacist if you have questions. COMMON BRAND NAME(S): VYVGART What should I tell my care team before I take this medication? They need to know if you have any of these conditions: Immune system problems Infection Kidney disease An unusual or allergic reaction to efgartigimod alfa, other medications, foods, dyes, or preservatives Pregnant or trying to get pregnant Breast-feeding How should I use this medication? This medication is infused into a vein. It is given by your care team in a hospital or clinic setting. Talk to your care team about the use of this medication in children. Special care may be needed. Overdosage: If you think you have taken too much of this medicine contact a poison control center or emergency room at once. NOTE: This medicine is only for you. Do not share this medicine with others. What if I miss a dose? Keep appointments for follow-up doses. It is important not to miss your dose. Call your care team if you are unable to keep an appointment. What may interact with this medication? Eculizumab Immune globulin Live virus vaccines Ravulizumab Rozanolixizumab This list may not describe all possible interactions. Give your health care provider a list of all the medicines, herbs, non-prescription drugs, or dietary supplements you use. Also tell them if you smoke, drink alcohol, or use illegal drugs. Some items may interact with your medicine. What should I watch for while using this medication? Visit your care team for regular checks on your progress. Tell your care team if your symptoms do not start to get better or if they get worse. Your condition  will be monitored carefully while you are receiving this medication. This medication may increase your risk of getting an infection. Call your care team for advice if you get a fever, chills, sore throat, or other symptoms of a cold or flu. Do not treat yourself. Try to avoid being around people who are sick. What side effects may I notice from receiving this medication? Side effects that you should report to your care team as soon as possible: Allergic reactions or angioedema--skin rash, itching or hives, swelling of the face, eyes, lips, tongue, arms, or legs, trouble swallowing or breathing Infection--fever, chills, cough, sore throat, wounds that don't heal, pain or trouble when passing urine, general feeling of discomfort or being unwell Infusion reactions--chest pain, shortness of breath or trouble breathing, feeling faint or lightheaded Side effects that usually do not require medical attention (report these to your care team if they continue or are bothersome): Burning or tingling sensation in hands or feet Headache Muscle pain This list may not describe all possible side effects. Call your doctor for medical advice about side effects. You may report side effects to FDA at 1-800-FDA-1088. Where should I keep my medication? This medication is given in a hospital or clinic. It will not be stored at home. NOTE: This sheet is a summary. It may not cover all possible information. If you have questions about this medicine, talk to your doctor, pharmacist, or health care provider.  2024 Elsevier/Gold Standard (2022-09-06 00:00:00)

## 2022-12-10 NOTE — Progress Notes (Signed)
Diagnosis: Myasthenia Gravis  Provider:  Chilton Greathouse MD  Procedure: Injection  Vyvgart Hytrulo, Dose: 1,008mg  , Site: subcutaneous, Number of injections: 1  Post Care: Observation period completed  Discharge: Condition: Good, Destination: Home . AVS Provided  Performed by:  Adriana Mccallum, RN

## 2022-12-12 ENCOUNTER — Other Ambulatory Visit (HOSPITAL_COMMUNITY): Payer: Self-pay

## 2022-12-12 ENCOUNTER — Other Ambulatory Visit: Payer: Self-pay | Admitting: Family Medicine

## 2022-12-12 ENCOUNTER — Encounter: Payer: Self-pay | Admitting: Neurology

## 2022-12-12 DIAGNOSIS — I10 Essential (primary) hypertension: Secondary | ICD-10-CM

## 2022-12-14 ENCOUNTER — Encounter: Payer: Self-pay | Admitting: Neurology

## 2022-12-14 ENCOUNTER — Other Ambulatory Visit (HOSPITAL_COMMUNITY): Payer: Self-pay

## 2022-12-14 ENCOUNTER — Other Ambulatory Visit: Payer: Self-pay

## 2022-12-14 MED ORDER — AMLODIPINE BESY-BENAZEPRIL HCL 10-40 MG PO CAPS
1.0000 | ORAL_CAPSULE | Freq: Every day | ORAL | 1 refills | Status: DC
Start: 2022-12-14 — End: 2023-06-15
  Filled 2022-12-14: qty 90, 90d supply, fill #0
  Filled 2023-03-12: qty 90, 90d supply, fill #1

## 2022-12-16 ENCOUNTER — Ambulatory Visit: Payer: BC Managed Care – PPO | Admitting: Neurology

## 2022-12-17 ENCOUNTER — Ambulatory Visit (INDEPENDENT_AMBULATORY_CARE_PROVIDER_SITE_OTHER): Payer: BC Managed Care – PPO

## 2022-12-17 VITALS — BP 126/78 | HR 87 | Temp 98.0°F | Resp 18 | Ht 70.0 in | Wt 195.4 lb

## 2022-12-17 DIAGNOSIS — G7000 Myasthenia gravis without (acute) exacerbation: Secondary | ICD-10-CM | POA: Diagnosis not present

## 2022-12-17 MED ORDER — EFGARTIGIMOD ALFA-HYALUR-QVFC 180-2000 MG-UNIT/ML ~~LOC~~ SOLN
1008.0000 mg | Freq: Once | SUBCUTANEOUS | Status: AC
  Administered 2022-12-17: 1008 mg via SUBCUTANEOUS
  Filled 2022-12-17: qty 5.6

## 2022-12-17 NOTE — Progress Notes (Signed)
Diagnosis: Myasthenia Gravis  Provider:  Chilton Greathouse MD  Procedure: Injection  Vyvgart, Dose: 1008mg  , Site: subcutaneous, Number of injections: 1  Post Care: Observation period completed  Discharge: Condition: Good, Destination: Home . AVS Declined  Performed by:  Garnette Czech, RN

## 2022-12-21 ENCOUNTER — Other Ambulatory Visit: Payer: Self-pay

## 2022-12-22 ENCOUNTER — Other Ambulatory Visit (HOSPITAL_COMMUNITY): Payer: Self-pay

## 2022-12-24 ENCOUNTER — Ambulatory Visit (INDEPENDENT_AMBULATORY_CARE_PROVIDER_SITE_OTHER): Payer: BC Managed Care – PPO

## 2022-12-24 VITALS — BP 122/81 | HR 89 | Temp 97.6°F | Resp 16 | Ht 70.0 in | Wt 197.0 lb

## 2022-12-24 DIAGNOSIS — G7000 Myasthenia gravis without (acute) exacerbation: Secondary | ICD-10-CM | POA: Diagnosis not present

## 2022-12-24 MED ORDER — EFGARTIGIMOD ALFA-HYALUR-QVFC 180-2000 MG-UNIT/ML ~~LOC~~ SOLN
1008.0000 mg | Freq: Once | SUBCUTANEOUS | Status: AC
  Administered 2022-12-24: 1008 mg via SUBCUTANEOUS
  Filled 2022-12-24: qty 5.6

## 2022-12-24 NOTE — Progress Notes (Signed)
Diagnosis: Myasthenia Gravis  Provider:  Chilton Greathouse MD  Procedure: Injection  Vyvgart, , Dose: 1008mg  , Site: subcutaneous, Number of injections: 1  Post Care: Observation period completed  Discharge: Condition: Good, Destination: Home . AVS Declined  Performed by:  Adriana Mccallum, RN

## 2022-12-31 ENCOUNTER — Ambulatory Visit (INDEPENDENT_AMBULATORY_CARE_PROVIDER_SITE_OTHER): Payer: BC Managed Care – PPO

## 2022-12-31 VITALS — BP 131/82 | HR 94 | Temp 98.7°F | Resp 12 | Ht 70.0 in | Wt 194.2 lb

## 2022-12-31 DIAGNOSIS — G7000 Myasthenia gravis without (acute) exacerbation: Secondary | ICD-10-CM | POA: Diagnosis not present

## 2022-12-31 MED ORDER — EFGARTIGIMOD ALFA-HYALUR-QVFC 180-2000 MG-UNIT/ML ~~LOC~~ SOLN
1008.0000 mg | Freq: Once | SUBCUTANEOUS | Status: AC
  Administered 2022-12-31: 1008 mg via SUBCUTANEOUS
  Filled 2022-12-31: qty 5.6

## 2022-12-31 NOTE — Progress Notes (Signed)
Diagnosis: Myasthenia Gravis  Provider:  Chilton Greathouse MD  Procedure: Subcutaneous Infusion  IV Type: Peripheral, IV Location: Subcutaneous infusion via butterfly in abdomen  Vygart (Efgartigimod alfa-fcab), Dose: 1008 mg  Infusion Start Time: 1422  Infusion Stop Time: 1423  Post Infusion IV Care: Subcutaneous infusion needle discontinued  Discharge: Condition: Stable, Destination: Home . AVS Declined  Performed by:  Wyvonne Lenz, RN

## 2023-01-01 ENCOUNTER — Other Ambulatory Visit (HOSPITAL_COMMUNITY): Payer: Self-pay

## 2023-01-21 ENCOUNTER — Other Ambulatory Visit (HOSPITAL_COMMUNITY): Payer: Self-pay

## 2023-01-27 ENCOUNTER — Ambulatory Visit (INDEPENDENT_AMBULATORY_CARE_PROVIDER_SITE_OTHER): Payer: BC Managed Care – PPO | Admitting: Family Medicine

## 2023-01-27 ENCOUNTER — Encounter: Payer: Self-pay | Admitting: Family Medicine

## 2023-01-27 VITALS — BP 117/79 | HR 97 | Ht 70.0 in | Wt 195.0 lb

## 2023-01-27 DIAGNOSIS — I10 Essential (primary) hypertension: Secondary | ICD-10-CM

## 2023-01-27 DIAGNOSIS — K649 Unspecified hemorrhoids: Secondary | ICD-10-CM | POA: Insufficient documentation

## 2023-01-27 DIAGNOSIS — E119 Type 2 diabetes mellitus without complications: Secondary | ICD-10-CM

## 2023-01-27 DIAGNOSIS — E039 Hypothyroidism, unspecified: Secondary | ICD-10-CM

## 2023-01-27 DIAGNOSIS — G7000 Myasthenia gravis without (acute) exacerbation: Secondary | ICD-10-CM

## 2023-01-27 LAB — POCT GLYCOSYLATED HEMOGLOBIN (HGB A1C): HbA1c, POC (controlled diabetic range): 7.9 % — AB (ref 0.0–7.0)

## 2023-01-27 NOTE — Assessment & Plan Note (Signed)
A1c improved to 7.9% today.  Continue to work on dietary changes.  Continue current medications.

## 2023-01-27 NOTE — Progress Notes (Signed)
Mark Black - 57 y.o. male MRN [id number]  Date of birth: [birth year]  Subjective Chief Complaint  Patient presents with   Diabetes    HPI Mark Black is a 57 y.o. male here today for follow up visit.   He has been on prednisone to help with myasthenia.  He has stopped mestinon due to GI upset.  His blood sugars have been a little higher due to prednisone use.  He remains on metformin, farxiga and Ozempic.  He reports that he is doing well with current medications.      HTN remains controlled with amlodipine/benazepril and bisoprolol/hydrochlorothiazide.  He is tolerating this well at current strength.  BP is well controlled.  Denies chest pain,shortness of breath, palpitations, headache or new vision changes.   Remains on levothyroxine at .  This was reduced slightly after his last appt.   He has lump around anal area.  Noted after BM this morning.  Noted a little blood on the toilet paper after wiping.  Not a whole lot of pain in this area.   ROS:  A comprehensive ROS was completed and negative except as noted per HPI  No Known Allergies  Past Medical History:  Diagnosis Date   Allergic rhinitis 12/24/2017   Allergy    Alopecia totalis    Anxiety 12/24/2017   Asthma    as a child   Chicken pox    Diabetes mellitus without complication (HCC)    Elevated liver enzymes 10/13/2020   Essential hypertension 02/10/2013   Eyelid abnormality 04/23/2021   GERD (gastroesophageal reflux disease) 01/27/2019   Hypogonadism in male 06/26/2019   Hypothyroidism 02/10/2013   Lower urinary tract symptoms (LUTS) 12/24/2017   Screening for colon cancer 12/24/2017   Suspected COVID-19 virus infection 06/20/2020   T2DM (type 2 diabetes mellitus) (HCC) 03/21/2018   Vitiligo     Past Surgical History:  Procedure Laterality Date   CARPAL TUNNEL RELEASE     Bil   NASAL SINUS SURGERY  1994   cut and open air passages/nostrils    Social History   Socioeconomic History   Marital  status: Married    Spouse name: Not on file   Number of children: 2   Years of education: Not on file   Highest education level: Not on file  Occupational History   Not on file  Tobacco Use   Smoking status: Never   Smokeless tobacco: Never  Vaping Use   Vaping status: Never Used  Substance and Sexual Activity   Alcohol use: Not Currently   Drug use: Not Currently   Sexual activity: Not on file  Other Topics Concern   Not on file  Social History Narrative   Right Handed   Lives in a one story home with wife and son   Caffeine 2 cups coffee     Father passed away in his 87's from drowning   Work with [employer] representative   Social Determinants of Health   Financial Resource Strain: Not on file  Food Insecurity: Not on file  Transportation Needs: Not on file  Physical Activity: Not on file  Stress: Not on file  Social Connections: Not on file    Family History  Problem Relation Age of Onset   Diabetes Father    Hypertension Father    Diabetes Paternal Aunt    Cancer Maternal Aunt        breast   Diabetes Sister    Hypertension Sister    Diabetes  Brother    Colon cancer Neg Hx    Colon polyps Neg Hx    Esophageal cancer Neg Hx    Stomach cancer Neg Hx    Rectal cancer Neg Hx     Health Maintenance  Topic Date Due   COVID-19 Vaccine (3 - 2023-24 season) 02/12/2023 (Originally 03/13/2022)   FOOT EXAM  04/29/2023 (Originally 04/23/2022)   OPHTHALMOLOGY EXAM  04/29/2023 (Originally 05/20/2022)   Hepatitis C Screening  01/27/2024 (Originally 01/11/1982)   HIV Screening  01/27/2024 (Originally 01/12/1979)   INFLUENZA VACCINE  02/11/2023   Diabetic kidney evaluation - Urine ACR  04/28/2023   HEMOGLOBIN A1C  07/30/2023   Diabetic kidney evaluation - eGFR measurement  09/03/2023   DTaP/Tdap/Td (2 - Td or Tdap) 01/27/2027   Colonoscopy  05/27/2028   Zoster Vaccines- Shingrix  Completed   HPV VACCINES  Aged Out      ----------------------------------------------------------------------------------------------------------------------------------------------------------------------------------------------------------------- Physical Exam BP 117/79 (BP Location: Left Arm, Patient Position: Sitting, Cuff Size: Normal)   Pulse 97   Ht 5\' 10"  (1.778 m)   Wt 195 lb (88.5 kg)   SpO2 98%   BMI 27.98 kg/m   Physical Exam Constitutional:      Appearance: Normal appearance.  Cardiovascular:     Rate and Rhythm: Normal rate and regular rhythm.  Pulmonary:     Effort: Pulmonary effort is normal.     Breath sounds: Normal breath sounds.  Genitourinary:    Comments: Hemorrhoid noted, non-tender.  Neurological:     Mental Status: He is alert.  Psychiatric:        Mood and Affect: Mood normal.        Behavior: Behavior normal.     ------------------------------------------------------------------------------------------------------------------------------------------------------------------------------------------------------------------- Assessment and Plan  Essential hypertension BP is well controlled.  Continue current medications at current strength for management of HTN.    T2DM (type 2 diabetes mellitus) (HCC) A1c improved to 7.9% today.  Continue to work on dietary changes.  Continue current medications.   Hypothyroidism Feels well with current strength of levothyroxine.  Continue at current strength.   Myasthenia gravis without acute exacerbation (HCC) Acute flare a few months ago and is on prednisone.  He will continue under management of neurology.   Hemorrhoid He doesn't really have any symptoms, does not appear to be thrombosed.  Recommend increase in fiber and fluid intake.    No orders of the defined types were placed in this encounter.   No follow-ups on file.    This visit occurred during the SARS-CoV-2 public health emergency.  Safety protocols were in place,  including screening questions prior to the visit, additional usage of staff PPE, and extensive cleaning of exam room while observing appropriate contact time as indicated for disinfecting solutions.

## 2023-01-27 NOTE — Assessment & Plan Note (Signed)
He doesn't really have any symptoms, does not appear to be thrombosed.  Recommend increase in fiber and fluid intake.

## 2023-01-27 NOTE — Patient Instructions (Signed)
Increase fiber and fluid intake. You may use witch hazel wipes for irritation/itching.  Preparation H can be helpful for irritation as well.  Let me know if symptoms worsen.

## 2023-01-27 NOTE — Assessment & Plan Note (Signed)
Feels well with current strength of levothyroxine.  Continue at current strength.

## 2023-01-27 NOTE — Assessment & Plan Note (Signed)
BP is well controlled.  Continue current medications at current strength for management of HTN.

## 2023-01-27 NOTE — Assessment & Plan Note (Signed)
Acute flare a few months ago and is on prednisone.  He will continue under management of neurology.

## 2023-01-30 ENCOUNTER — Other Ambulatory Visit (HOSPITAL_COMMUNITY): Payer: Self-pay

## 2023-01-30 ENCOUNTER — Other Ambulatory Visit: Payer: Self-pay | Admitting: Family Medicine

## 2023-01-30 DIAGNOSIS — E039 Hypothyroidism, unspecified: Secondary | ICD-10-CM

## 2023-02-01 ENCOUNTER — Other Ambulatory Visit (HOSPITAL_COMMUNITY): Payer: Self-pay

## 2023-02-01 MED ORDER — LEVOTHYROXINE SODIUM 200 MCG PO TABS
200.0000 ug | ORAL_TABLET | Freq: Every day | ORAL | 1 refills | Status: DC
Start: 2023-02-01 — End: 2023-07-27
  Filled 2023-02-01: qty 90, 90d supply, fill #0
  Filled 2023-04-23 (×2): qty 90, 90d supply, fill #1

## 2023-02-04 ENCOUNTER — Other Ambulatory Visit (HOSPITAL_COMMUNITY): Payer: Self-pay

## 2023-02-26 ENCOUNTER — Other Ambulatory Visit (HOSPITAL_BASED_OUTPATIENT_CLINIC_OR_DEPARTMENT_OTHER): Payer: Self-pay

## 2023-02-26 ENCOUNTER — Other Ambulatory Visit: Payer: Self-pay

## 2023-02-26 ENCOUNTER — Other Ambulatory Visit (HOSPITAL_COMMUNITY): Payer: Self-pay

## 2023-03-09 ENCOUNTER — Ambulatory Visit: Payer: BC Managed Care – PPO | Admitting: Neurology

## 2023-03-12 ENCOUNTER — Other Ambulatory Visit: Payer: Self-pay | Admitting: Family Medicine

## 2023-03-12 ENCOUNTER — Other Ambulatory Visit (HOSPITAL_COMMUNITY): Payer: Self-pay

## 2023-03-12 DIAGNOSIS — F419 Anxiety disorder, unspecified: Secondary | ICD-10-CM

## 2023-03-12 MED ORDER — DESVENLAFAXINE SUCCINATE ER 100 MG PO TB24
100.0000 mg | ORAL_TABLET | Freq: Every day | ORAL | 0 refills | Status: DC
Start: 2023-03-12 — End: 2023-06-15
  Filled 2023-03-12 – 2023-03-16 (×2): qty 90, 90d supply, fill #0

## 2023-03-16 ENCOUNTER — Other Ambulatory Visit: Payer: Self-pay

## 2023-03-16 ENCOUNTER — Other Ambulatory Visit (HOSPITAL_COMMUNITY): Payer: Self-pay

## 2023-03-16 ENCOUNTER — Encounter: Payer: Self-pay | Admitting: Neurology

## 2023-03-17 ENCOUNTER — Other Ambulatory Visit: Payer: Self-pay | Admitting: Neurology

## 2023-03-17 ENCOUNTER — Other Ambulatory Visit: Payer: Self-pay | Admitting: Family Medicine

## 2023-03-17 NOTE — Telephone Encounter (Signed)
Called patient and left a message for a call back. Need to know what how much prednisone patient is currently taking and if he is needing a refill.

## 2023-03-18 ENCOUNTER — Other Ambulatory Visit (HOSPITAL_COMMUNITY): Payer: Self-pay

## 2023-03-18 NOTE — Telephone Encounter (Signed)
Called and left message to call office

## 2023-03-19 ENCOUNTER — Other Ambulatory Visit (HOSPITAL_COMMUNITY): Payer: Self-pay

## 2023-03-19 MED ORDER — BUPROPION HCL ER (XL) 150 MG PO TB24
150.0000 mg | ORAL_TABLET | Freq: Every day | ORAL | 0 refills | Status: DC
  Filled 2023-03-19: qty 90, 90d supply, fill #0

## 2023-03-22 ENCOUNTER — Encounter: Payer: Self-pay | Admitting: Neurology

## 2023-03-22 ENCOUNTER — Other Ambulatory Visit: Payer: Self-pay

## 2023-03-22 ENCOUNTER — Other Ambulatory Visit (HOSPITAL_COMMUNITY): Payer: Self-pay

## 2023-03-22 MED ORDER — PREDNISONE 10 MG PO TABS
20.0000 mg | ORAL_TABLET | Freq: Every day | ORAL | 3 refills | Status: DC
  Filled 2023-03-22: qty 60, 30d supply, fill #0
  Filled 2023-04-23 (×2): qty 60, 30d supply, fill #1
  Filled 2023-06-01: qty 60, 30d supply, fill #2

## 2023-03-24 ENCOUNTER — Other Ambulatory Visit: Payer: Self-pay | Admitting: Family Medicine

## 2023-03-25 ENCOUNTER — Other Ambulatory Visit (HOSPITAL_COMMUNITY): Payer: Self-pay

## 2023-03-25 MED ORDER — PANTOPRAZOLE SODIUM 40 MG PO TBEC
40.0000 mg | DELAYED_RELEASE_TABLET | Freq: Every day | ORAL | 2 refills | Status: DC
  Filled 2023-03-25: qty 90, 90d supply, fill #0
  Filled 2023-06-30: qty 90, 90d supply, fill #1
  Filled 2023-09-24: qty 90, 90d supply, fill #2

## 2023-03-26 ENCOUNTER — Encounter: Payer: Self-pay | Admitting: Family Medicine

## 2023-03-26 ENCOUNTER — Other Ambulatory Visit (HOSPITAL_COMMUNITY): Payer: Self-pay

## 2023-04-23 ENCOUNTER — Other Ambulatory Visit (HOSPITAL_COMMUNITY): Payer: Self-pay

## 2023-04-23 ENCOUNTER — Other Ambulatory Visit: Payer: Self-pay

## 2023-04-26 ENCOUNTER — Other Ambulatory Visit (HOSPITAL_COMMUNITY): Payer: Self-pay

## 2023-05-11 ENCOUNTER — Encounter: Payer: Self-pay | Admitting: Family Medicine

## 2023-05-11 ENCOUNTER — Other Ambulatory Visit: Payer: Self-pay | Admitting: Family Medicine

## 2023-05-12 ENCOUNTER — Encounter: Payer: Self-pay | Admitting: Neurology

## 2023-05-12 ENCOUNTER — Other Ambulatory Visit (HOSPITAL_COMMUNITY): Payer: Self-pay

## 2023-05-12 MED ORDER — OZEMPIC (0.25 OR 0.5 MG/DOSE) 2 MG/3ML ~~LOC~~ SOPN
PEN_INJECTOR | SUBCUTANEOUS | 3 refills | Status: DC
  Filled 2023-05-12: qty 3, 28d supply, fill #0
  Filled 2023-06-15: qty 3, 28d supply, fill #1

## 2023-05-18 ENCOUNTER — Encounter: Payer: Self-pay | Admitting: Family Medicine

## 2023-05-18 ENCOUNTER — Encounter: Payer: Self-pay | Admitting: Neurology

## 2023-05-29 IMAGING — CT CT CHEST W/ CM
2 of 5 series · 15 of 36 positions shown, 18 images · IV contrast (agent unspecified)
Comparison: None.

CLINICAL DATA: Myasthenia gravis.  Evaluate for thymoma.

EXAM:
CT CHEST WITH CONTRAST
TECHNIQUE: Multidetector CT imaging of the chest was performed during
intravenous contrast administration.

[Series 4: chest 2.00 br40 s3 · coronal · 0.69mm/px · 3 of 160 slices shown]
[im 32/160  lung]
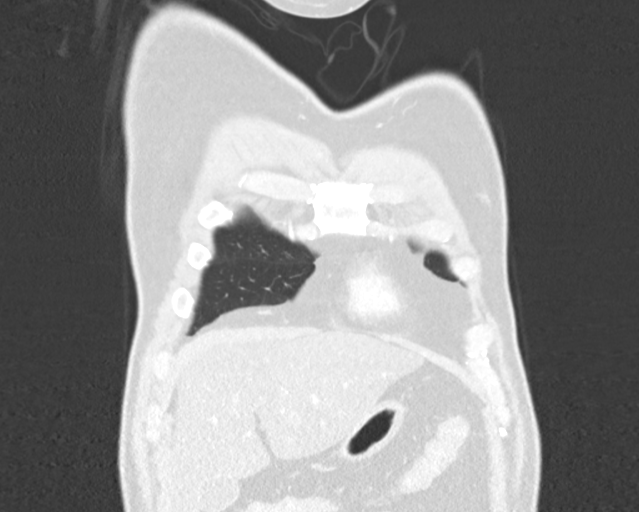
[im 64/160  lung]
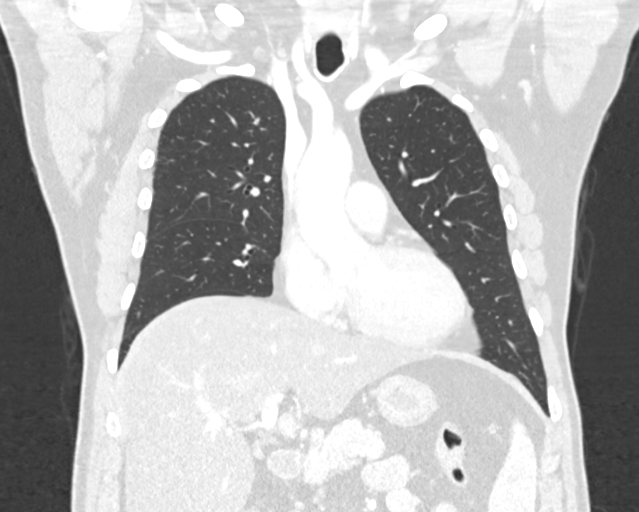
[im 96/160  lung]
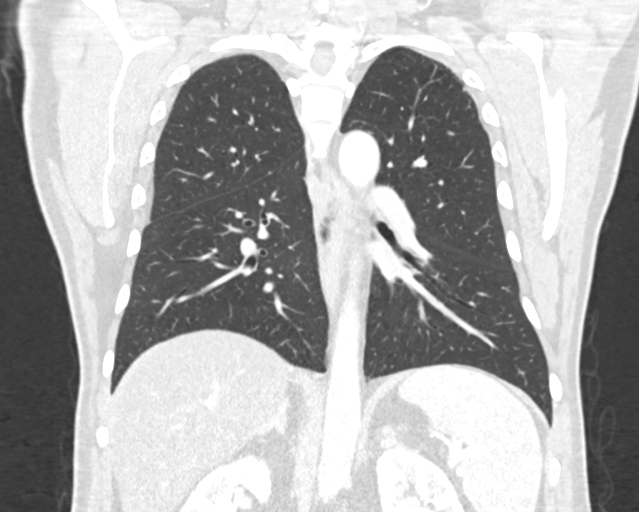

[Series 10: chest 1.00 br40 s3 · axial · 0.81mm/px · z∈[+1514,+1819]mm · 12 of 442 slices shown, 15 images]
[im 30/442  mediastinal]
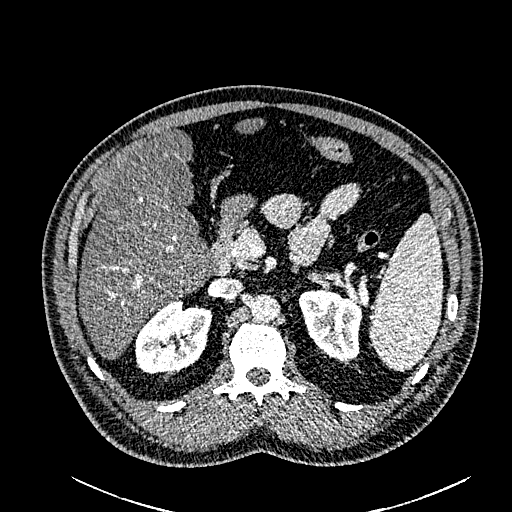
[im 30/442  lung]
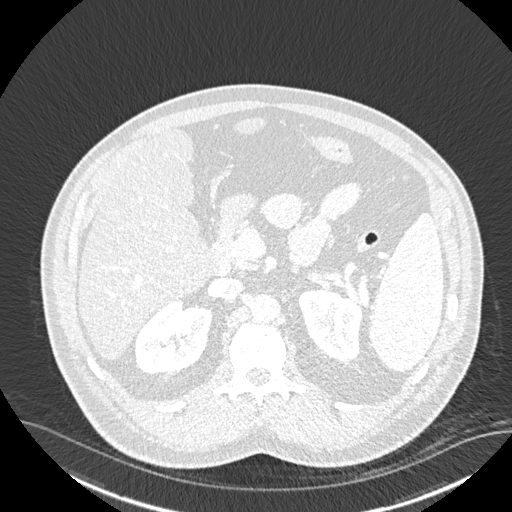
[im 59/442  lung]
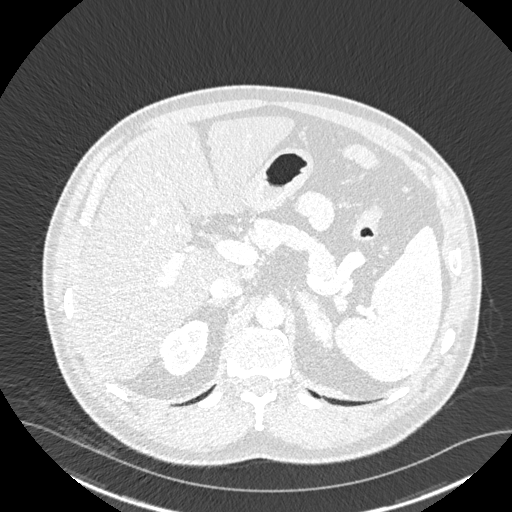
[im 89/442  lung]
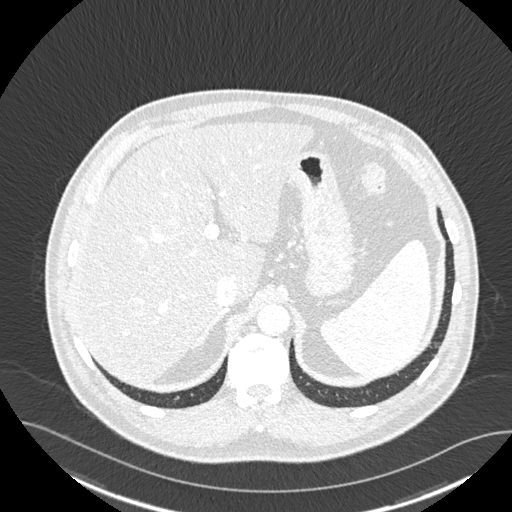
[im 148/442  lung]
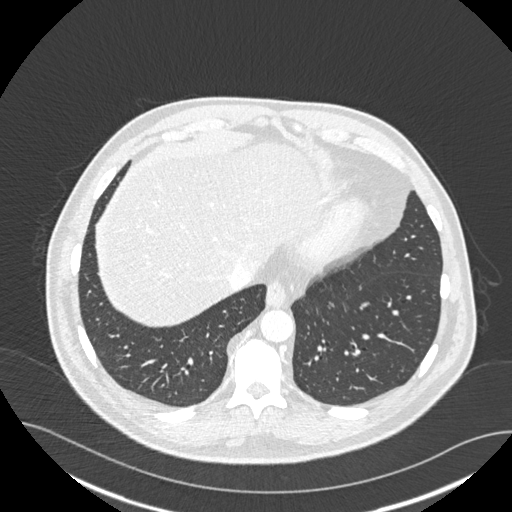
[im 177/442  mediastinal]
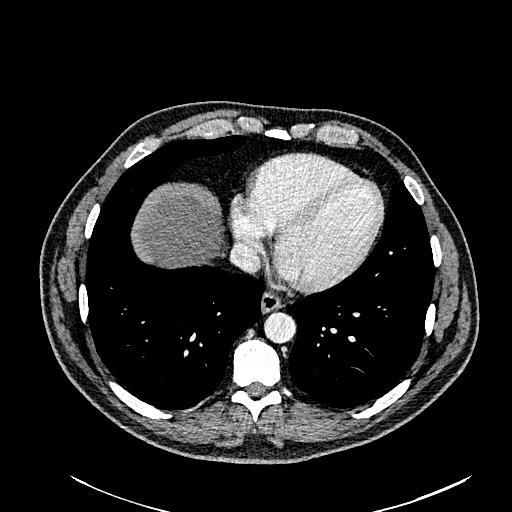
[im 177/442  lung]
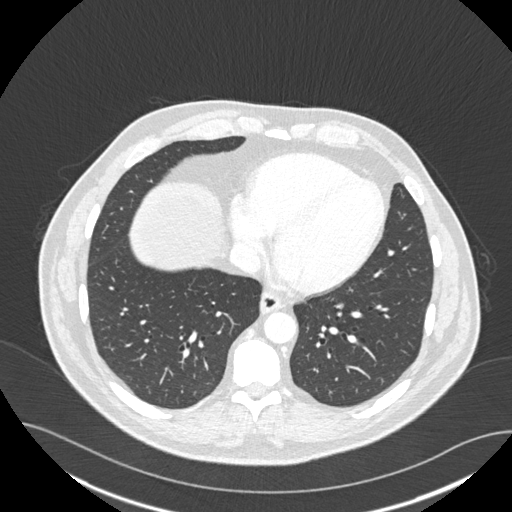
[im 206/442  lung]
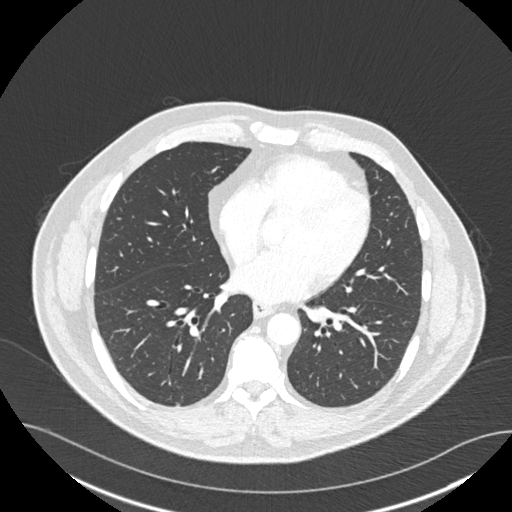
[im 236/442  lung]
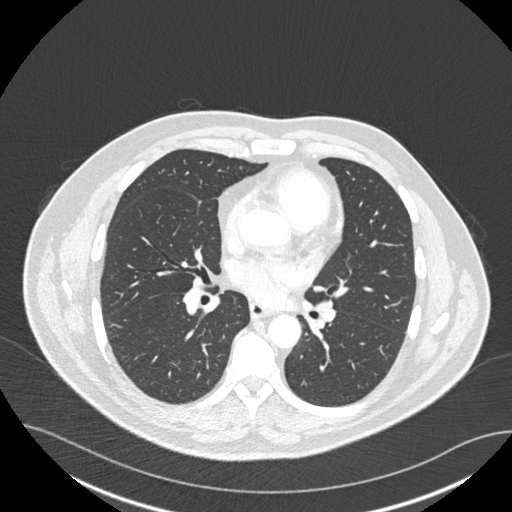
[im 265/442  lung]
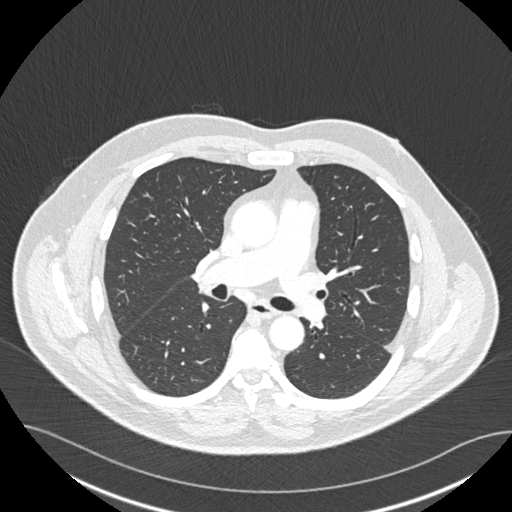
[im 295/442  mediastinal]
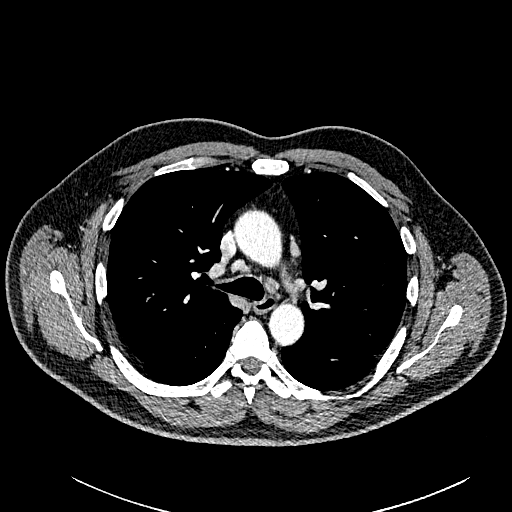
[im 295/442  lung]
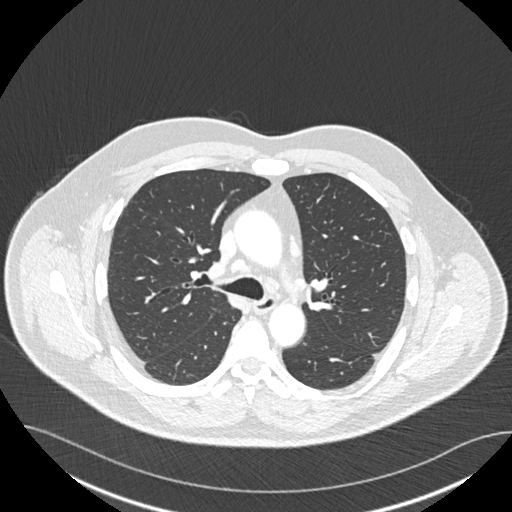
[im 353/442  lung]
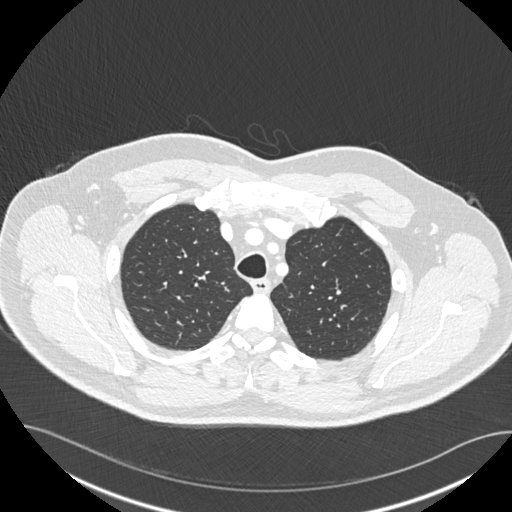
[im 383/442  lung]
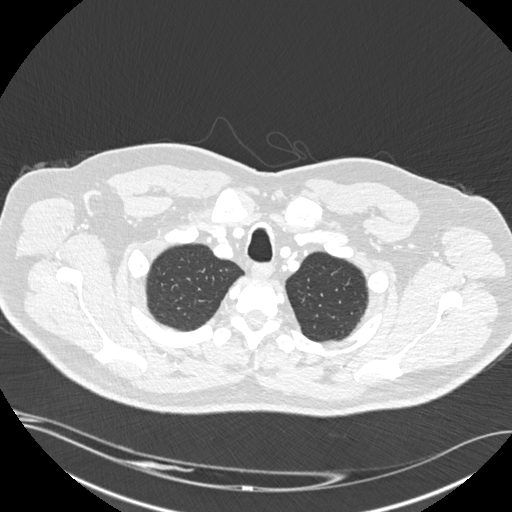
[im 412/442  lung]
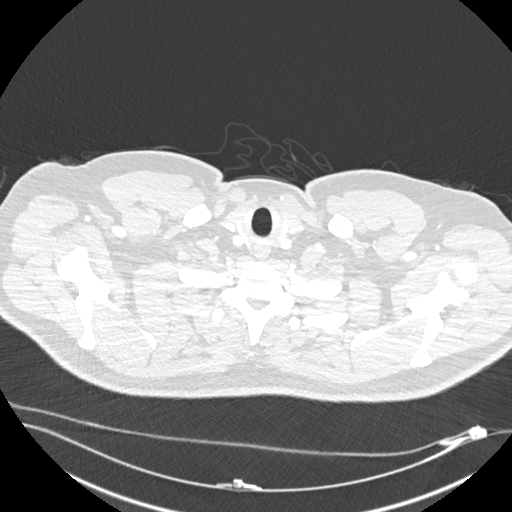

[15 of 36 positions shown; findings below may reference images not displayed]

RADIATION DOSE REDUCTION: This exam was performed according to the
departmental dose-optimization program which includes automated
exposure control, adjustment of the mA and/or kV according to
patient size and/or use of iterative reconstruction technique.

CONTRAST:  75mL KR3VZU-ISS IOPAMIDOL (KR3VZU-ISS) INJECTION 61%
FINDINGS: Cardiovascular: Heart is normal size. Aorta is normal caliber.

Mediastinum/Nodes: No mediastinal, hilar, or axillary adenopathy.
Small mediastinal lymph nodes measuring 7-8 mm. None pathologically
enlarged. No anterior mediastinal mass to suggest thymoma. Trachea
and esophagus are unremarkable. Thyroid unremarkable.

Lungs/Pleura: Lungs are clear. No focal airspace opacities or
suspicious nodules. No effusions.

Upper Abdomen: Diffuse fatty infiltration of the liver. No acute
findings.

Musculoskeletal: Chest wall soft tissues are unremarkable. No acute
bony abnormality.
IMPRESSION: No evidence of thymoma.

No acute cardiopulmonary disease.

Fatty liver.

## 2023-06-01 ENCOUNTER — Other Ambulatory Visit (HOSPITAL_COMMUNITY): Payer: Self-pay

## 2023-06-12 ENCOUNTER — Encounter: Payer: Self-pay | Admitting: Neurology

## 2023-06-14 ENCOUNTER — Encounter: Payer: Self-pay | Admitting: Neurology

## 2023-06-15 ENCOUNTER — Encounter: Payer: Self-pay | Admitting: Neurology

## 2023-06-15 ENCOUNTER — Ambulatory Visit: Payer: 59 | Admitting: Neurology

## 2023-06-15 ENCOUNTER — Other Ambulatory Visit: Payer: Self-pay | Admitting: Family Medicine

## 2023-06-15 ENCOUNTER — Other Ambulatory Visit (HOSPITAL_COMMUNITY): Payer: Self-pay

## 2023-06-15 VITALS — BP 133/87 | HR 99 | Ht 70.0 in | Wt 198.0 lb

## 2023-06-15 DIAGNOSIS — G7001 Myasthenia gravis with (acute) exacerbation: Secondary | ICD-10-CM | POA: Diagnosis not present

## 2023-06-15 DIAGNOSIS — I10 Essential (primary) hypertension: Secondary | ICD-10-CM

## 2023-06-15 DIAGNOSIS — F419 Anxiety disorder, unspecified: Secondary | ICD-10-CM

## 2023-06-15 MED ORDER — PREDNISONE 10 MG PO TABS
20.0000 mg | ORAL_TABLET | Freq: Every day | ORAL | 3 refills | Status: DC
  Filled 2023-06-15 – 2023-06-30 (×2): qty 60, 30d supply, fill #0
  Filled 2023-07-27: qty 60, 30d supply, fill #1
  Filled 2023-09-24: qty 60, 30d supply, fill #2

## 2023-06-15 NOTE — Progress Notes (Signed)
Follow-up Visit   Date: 06/15/23   Mark Black MRN: [id number] DOB: 10-02-1965   Interim History: Mark Black is a 57 y.o. 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  The patient was accompanied to the clinic by wife.   IMPRESSION/PLAN: Seropositive myasthenia gravis with exacerbation, thymoma negative (dx 08/2021).  He was initially managed with prednisone 30mg /d, but did not appreciate any benefit so in May 2024, he was started on Vyvgart Hytrulo and reported significant improvement.  He was able to taper prednisone, but once he reduced it to 10mg , his symptoms returned.  He has been on prednisone 20mg  since October and continues to have severe right ptosis, diplopia, and generalized weakness. MGFA classification IIa.   MG history:  - 08/2021 presented with ptosis and diplopia, symptoms initially controlled on mestinon prn  - July 2023 worsening ocular symptoms.  Started prednisone.  - Sept 2023 prednisone increased to 25mg /d which improved ptosis, mestinon stopped due to GI side effects  - Jan 2024, started prednisone taper  - April 2024, worsening generalized symptoms  - May - June 2024, Vyvgart Hytrulo   PLAN/RECOMMENDATIONS:  Continue prednisone 20mg  daily.  Redose with Vyvgart Hytrulo with plans to repeat in 6 weeks. Discontinue mestinon due to GI side effects  Return to clinic in 2 months  ------------------------------------------- History of present illness: Starting around the fall of 2022, he began having intermittent droopiness of the left eyelid.  It can occur several days of the week and when present, it will be constant all day.  Rest does not seem to make any improvement, as he can wake up with it.  No double vision, difficulty swallowing/talking, jaw fatigue, or limb weakness.  He  was seen by his ophthalmologist who referred him for evaluation of myasthenia gravis.    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    UPDATE 11/25/2022:  He was last seen in March at which time MG was very well controlled on prednisone 10mg  daily.  Unfortunately, over the past month, he reports generalized weakness, worsening double vision and droopiness of the eyelids.  His eyelids get very droopy, especially on the left where it can remain completely closed.  His right eyelid is not as severe. Double vision improved with closing one eye.   He has jaw fatigue and intermittent difficulty with swallowing.  No shortness of breath or slurred speech.  He increased prednisone to 20mg  daily but did not appreciate any benefit.  His HbA1c is 10 and is his currently not monitoring blood glucose.  He was recently started on Ozepmic.  No recent illnesses.   UPDATE 06/15/2023:  He was last seen in May and missed prior follow-up visit.  He reports after starting Vyvgart Hytrulo his symptoms significantly improved.  He was on prednisone 15mg  and when he tapered down to 10mg  every other day, his symptoms returned.  Since October, he has been on prednisone 20mg  daily without any benefit.  He continues to have severe droopiness and double vision.  He has some weakness in the arms and legs.  No difficulty with swallowing/speech. He is not taking mestinon due to GI side effects.   Medications:  Current Outpatient Medications on File Prior to Visit  Medication Sig Dispense Refill   amLODipine-benazepril (LOTREL)  10-40 MG capsule TAKE 1 CAPSULE BY MOUTH ONCE DAILY 90 capsule 1   bisoprolol-hydrochlorothiazide (ZIAC) 5-6.25 MG tablet Take 1 tablet by mouth daily. 90 tablet 1   buPROPion (WELLBUTRIN XL) 150 MG 24 hr tablet Take 1 tablet by mouth daily. 90 tablet 0   calcium carbonate (OS-CAL) 600 MG TABS  tablet Take 1 tablet (600 mg total) by mouth 2 (two) times daily with a meal. 60 tablet 5   dapagliflozin propanediol (FARXIGA) 5 MG TABS tablet Take 1 tablet (5 mg total) by mouth daily. 90 tablet 3   desvenlafaxine (PRISTIQ) 100 MG 24 hr tablet Take 1 tablet (100 mg total) by mouth daily. 90 tablet 0   glucose blood (ONETOUCH ULTRA) test strip Use to check blood glucose 3 times daily. 100 each PRN   levothyroxine (SYNTHROID) 200 MCG tablet Take 1 tablet by mouth daily before breakfast. Needs lab work. 90 tablet 1   metFORMIN (GLUCOPHAGE-XR) 500 MG 24 hr tablet TAKE 3 TABLETS (1,500 MG TOTAL) BY MOUTH DAILY WITH SUPPER. 270 tablet 3   Multiple Vitamins-Minerals (MULTIVITAMIN ADULT PO) Take 1 tablet by mouth daily.     pantoprazole (PROTONIX) 40 MG tablet Take 1 tablet (40 mg total) by mouth daily. 90 tablet 2   Semaglutide,0.25 or 0.5MG /DOS, (OZEMPIC, 0.25 OR 0.5 MG/DOSE,) 2 MG/3ML SOPN Inject 0.25mg  into the skin once weekly for 4 weeks then increase to 0.5mg  weekly 3 mL 3   tamsulosin (FLOMAX) 0.4 MG CAPS capsule Take 1 capsule (0.4 mg total) by mouth daily. 90 capsule 3   triamcinolone cream (KENALOG) 0.1 % Apply 1 application topically 2 (two) times daily. 30 g 0   Current Facility-Administered Medications on File Prior to Visit  Medication Dose Route Frequency Provider Last Rate Last Admin   0.9 %  sodium chloride infusion  500 mL Intravenous Once Armbruster, Willaim Rayas, MD        Allergies: No Known Allergies  Vital Signs:  BP 133/87   Pulse 99   Ht 5\' 10"  (1.778 m)   Wt 198 lb (89.8 kg)   SpO2 98%   BMI 28.41 kg/m    Neurological Exam: MENTAL STATUS including orientation to time, place, person, recent and remote memory, attention span and concentration, language, and fund of knowledge is normal.  Speech is not dysarthric.  CRANIAL NERVES:   Left eye is blind.  Opthalmoplegia of the left eye with lateral gaze, right extraocular muscles are limited at end-gaze only. Severe right  ptosis with near eye closure, mild left ptosis, worse with sustained upgaze.  Face is symmetric. Palate elevates symmetrically.  Orbicularis oculi and buccinator is 4/5.  MOTOR:  Motor strength is 5-/5 in all extremities, without fatigability.  No atrophy, fasciculations or abnormal movements.  No pronator drift.  Tone is normal.    COORDINATION/GAIT:   Gait narrow based and stable.   Data: Myasthenia gravis panel 08/13/2021:  AChR blocking 40*, modulating 79*  Lab Results  Component Value Date   HGBA1C 7.9 (A) 01/27/2023   CT chest wwo contrast 09/12/2021:  Negative for thymoma   Thank you for allowing me to participate in patient's care.  If I can answer any additional questions, I would be pleased to do so.    Sincerely,    Safal Halderman K. Allena Katz, DO

## 2023-06-16 ENCOUNTER — Encounter: Payer: Self-pay | Admitting: Neurology

## 2023-06-16 ENCOUNTER — Ambulatory Visit: Payer: BC Managed Care – PPO | Admitting: Neurology

## 2023-06-16 ENCOUNTER — Other Ambulatory Visit: Payer: Self-pay | Admitting: Pharmacy Technician

## 2023-06-16 ENCOUNTER — Other Ambulatory Visit (HOSPITAL_COMMUNITY): Payer: Self-pay

## 2023-06-16 MED ORDER — DESVENLAFAXINE SUCCINATE ER 100 MG PO TB24
100.0000 mg | ORAL_TABLET | Freq: Every day | ORAL | 0 refills | Status: DC
  Filled 2023-06-16: qty 90, 90d supply, fill #0

## 2023-06-16 MED ORDER — BUPROPION HCL ER (XL) 150 MG PO TB24
150.0000 mg | ORAL_TABLET | Freq: Every day | ORAL | 0 refills | Status: DC
  Filled 2023-06-16: qty 90, 90d supply, fill #0

## 2023-06-16 MED ORDER — AMLODIPINE BESY-BENAZEPRIL HCL 10-40 MG PO CAPS
1.0000 | ORAL_CAPSULE | Freq: Every day | ORAL | 1 refills | Status: DC
  Filled 2023-06-16: qty 90, 90d supply, fill #0
  Filled 2023-09-20: qty 90, 90d supply, fill #1

## 2023-06-17 ENCOUNTER — Other Ambulatory Visit (HOSPITAL_COMMUNITY): Payer: Self-pay

## 2023-06-22 ENCOUNTER — Encounter: Payer: Self-pay | Admitting: Neurology

## 2023-06-25 ENCOUNTER — Telehealth: Payer: Self-pay | Admitting: Pharmacy Technician

## 2023-06-25 NOTE — Telephone Encounter (Signed)
Called and spoke to Mark Black and informed Mark Black that his Mark Black has been denied and that they are wanting Mark Black to try IVIG. Informed patient that this is done over 2 days. Also informed patient that side effects can include: Headaches, BP fluctuation and that  BP & vitals will be monitored during infusion. Rare side effects include: clotting disorder DVT, and Kidney issues.  Patient is ok with above and is aware he will be contacted to be scheduled for his IVIG. Patient verbalized understanding and had no further questions or concerns.

## 2023-06-25 NOTE — Telephone Encounter (Signed)
Dr. Allena Katz,  Vyvgart has been denied due to patient has not tried other immunosuppressive meds (Privigen) and MG score was not documented.  The denial letter has been scanned to the media tab for your review.  Thanks Selena Batten

## 2023-06-29 ENCOUNTER — Telehealth: Payer: Self-pay | Admitting: Pharmacy Technician

## 2023-06-29 ENCOUNTER — Encounter: Payer: Self-pay | Admitting: Neurology

## 2023-06-29 NOTE — Telephone Encounter (Signed)
Dr. Allena Katz / Mark Black,  Patient has been approved for Privigen. We will get the patient scheduled as soon as possible.  Auth Submission: APPROVED Site of care: Site of care: [facility] Payer: [employer] Medication & CPT/J Code(s) submitted: Privigen (IVIG) J1459 Route of submission (phone, fax, portal): PORTAL Phone # Fax # Auth type: Buy/Bill PB Units/visits requested: X2 DOSES Reference number: [id number] Approval from: 06/25/23 to 07/25/23   Privigen co-pay card: Pending [employer] aware.

## 2023-06-30 ENCOUNTER — Other Ambulatory Visit (HOSPITAL_COMMUNITY): Payer: Self-pay

## 2023-07-13 ENCOUNTER — Other Ambulatory Visit: Payer: Self-pay

## 2023-07-19 ENCOUNTER — Telehealth: Payer: Self-pay

## 2023-07-19 NOTE — Telephone Encounter (Signed)
 Auth Submission: APPROVED - Renewal Site of care: Site of care: [facility] Payer: [employer] Medication & CPT/J Code(s) submitted: Privigen  (IVIG) J1459 Route of submission (phone, fax, portal): Portal Phone # Fax # Auth type: Buy/Bill PB Units/visits requested: 80gm x 2 doses Reference number: [id number] Approval from: 07/26/23 to 08/25/23  Approval letter in media tab

## 2023-07-20 ENCOUNTER — Ambulatory Visit: Payer: 59

## 2023-07-20 ENCOUNTER — Other Ambulatory Visit: Payer: Self-pay

## 2023-07-20 ENCOUNTER — Encounter: Payer: Self-pay | Admitting: Family Medicine

## 2023-07-20 VITALS — BP 138/85 | HR 100 | Temp 98.0°F | Resp 16 | Ht 71.0 in | Wt 194.2 lb

## 2023-07-20 DIAGNOSIS — G7000 Myasthenia gravis without (acute) exacerbation: Secondary | ICD-10-CM

## 2023-07-20 MED ORDER — ACETAMINOPHEN 325 MG PO TABS
650.0000 mg | ORAL_TABLET | Freq: Once | ORAL | Status: AC
  Administered 2023-07-20: 650 mg via ORAL
  Filled 2023-07-20: qty 2

## 2023-07-20 MED ORDER — DIPHENHYDRAMINE HCL 25 MG PO CAPS
25.0000 mg | ORAL_CAPSULE | Freq: Once | ORAL | Status: AC
  Administered 2023-07-20: 25 mg via ORAL
  Filled 2023-07-20: qty 1

## 2023-07-20 MED ORDER — IMMUNE GLOBULIN (HUMAN) 5 GM/50ML IV SOLN
1.0000 g/kg | Freq: Once | INTRAVENOUS | Status: AC
  Administered 2023-07-20: 75 g via INTRAVENOUS

## 2023-07-20 NOTE — Progress Notes (Signed)
 Diagnosis: Myasthenia Gravis  Provider:  Mannam, Praveen MD  Procedure: IV Infusion  IV Type: Peripheral, IV Location: R Antecubital  IVIG (Immune Globulin ), Dose: 75 g  Infusion Start Time: 0922  Infusion Stop Time: 1400  Post Infusion IV Care: Observation period completed and Peripheral IV Discontinued  Discharge: Condition: Stable, Destination: Home . AVS Declined  Performed by:  Rocky FORBES Sar, RN

## 2023-07-21 ENCOUNTER — Ambulatory Visit (INDEPENDENT_AMBULATORY_CARE_PROVIDER_SITE_OTHER): Payer: 59

## 2023-07-21 VITALS — BP 142/90 | HR 97 | Temp 98.2°F | Resp 18 | Ht 71.0 in | Wt 194.4 lb

## 2023-07-21 DIAGNOSIS — G7000 Myasthenia gravis without (acute) exacerbation: Secondary | ICD-10-CM

## 2023-07-21 MED ORDER — ACETAMINOPHEN 325 MG PO TABS
650.0000 mg | ORAL_TABLET | Freq: Once | ORAL | Status: AC
  Administered 2023-07-21: 650 mg via ORAL
  Filled 2023-07-21: qty 2

## 2023-07-21 MED ORDER — IMMUNE GLOBULIN (HUMAN) 5 GM/50ML IV SOLN
1.0000 g/kg | Freq: Once | INTRAVENOUS | Status: AC
  Administered 2023-07-21: 75 g via INTRAVENOUS

## 2023-07-21 MED ORDER — DIPHENHYDRAMINE HCL 25 MG PO CAPS
25.0000 mg | ORAL_CAPSULE | Freq: Once | ORAL | Status: AC
  Administered 2023-07-21: 25 mg via ORAL
  Filled 2023-07-21: qty 1

## 2023-07-21 MED ORDER — DEXTROSE 5 % IV SOLN: INTRAVENOUS | Status: DC

## 2023-07-21 NOTE — Progress Notes (Signed)
 Diagnosis: Myasthenia Gravis  Provider:  Praveen Mannam MD  Procedure: IV Infusion  IV Type: Peripheral, IV Location: R Forearm  IVIG (Immune Globulin ), Dose: 75g  Infusion Start Time: 0907  Infusion Stop Time: 1400  Post Infusion IV Care: Peripheral IV Discontinued  Discharge: Condition: Good, Destination: Home . AVS Declined  Performed by:  Donny Childes, RN

## 2023-07-22 ENCOUNTER — Other Ambulatory Visit: Payer: Self-pay

## 2023-07-27 ENCOUNTER — Other Ambulatory Visit: Payer: Self-pay | Admitting: Family Medicine

## 2023-07-27 ENCOUNTER — Encounter: Payer: Self-pay | Admitting: Neurology

## 2023-07-27 ENCOUNTER — Other Ambulatory Visit (HOSPITAL_COMMUNITY): Payer: Self-pay

## 2023-07-27 DIAGNOSIS — E039 Hypothyroidism, unspecified: Secondary | ICD-10-CM

## 2023-07-27 MED ORDER — LEVOTHYROXINE SODIUM 200 MCG PO TABS
200.0000 ug | ORAL_TABLET | Freq: Every day | ORAL | 0 refills | Status: DC
  Filled 2023-07-27 – 2023-08-02 (×2): qty 90, 90d supply, fill #0

## 2023-07-28 ENCOUNTER — Other Ambulatory Visit: Payer: Self-pay

## 2023-08-02 ENCOUNTER — Other Ambulatory Visit (HOSPITAL_COMMUNITY): Payer: Self-pay

## 2023-08-04 ENCOUNTER — Encounter: Payer: Self-pay | Admitting: Neurology

## 2023-08-06 ENCOUNTER — Other Ambulatory Visit (HOSPITAL_COMMUNITY): Payer: Self-pay

## 2023-08-06 ENCOUNTER — Ambulatory Visit (INDEPENDENT_AMBULATORY_CARE_PROVIDER_SITE_OTHER): Payer: 59 | Admitting: Family Medicine

## 2023-08-06 VITALS — BP 134/87 | HR 104 | Ht 71.0 in | Wt 196.0 lb

## 2023-08-06 DIAGNOSIS — G7000 Myasthenia gravis without (acute) exacerbation: Secondary | ICD-10-CM | POA: Diagnosis not present

## 2023-08-06 DIAGNOSIS — N529 Male erectile dysfunction, unspecified: Secondary | ICD-10-CM | POA: Diagnosis not present

## 2023-08-06 DIAGNOSIS — E781 Pure hyperglyceridemia: Secondary | ICD-10-CM | POA: Diagnosis not present

## 2023-08-06 DIAGNOSIS — E119 Type 2 diabetes mellitus without complications: Secondary | ICD-10-CM

## 2023-08-06 DIAGNOSIS — E291 Testicular hypofunction: Secondary | ICD-10-CM

## 2023-08-06 DIAGNOSIS — Z7984 Long term (current) use of oral hypoglycemic drugs: Secondary | ICD-10-CM

## 2023-08-06 DIAGNOSIS — Z23 Encounter for immunization: Secondary | ICD-10-CM | POA: Diagnosis not present

## 2023-08-06 DIAGNOSIS — I10 Essential (primary) hypertension: Secondary | ICD-10-CM | POA: Diagnosis not present

## 2023-08-06 DIAGNOSIS — E039 Hypothyroidism, unspecified: Secondary | ICD-10-CM

## 2023-08-06 DIAGNOSIS — Z125 Encounter for screening for malignant neoplasm of prostate: Secondary | ICD-10-CM

## 2023-08-06 LAB — POCT GLYCOSYLATED HEMOGLOBIN (HGB A1C): HbA1c, POC (controlled diabetic range): 8.8 % — AB (ref 0.0–7.0)

## 2023-08-06 MED ORDER — LEVOTHYROXINE SODIUM 200 MCG PO TABS
200.0000 ug | ORAL_TABLET | Freq: Every day | ORAL | 0 refills | Status: DC
  Filled 2023-08-06: qty 90, 90d supply, fill #0

## 2023-08-06 MED ORDER — BUPROPION HCL ER (XL) 150 MG PO TB24
150.0000 mg | ORAL_TABLET | Freq: Every day | ORAL | 1 refills | Status: DC
  Filled 2023-08-06 – 2023-09-24 (×2): qty 90, 90d supply, fill #0
  Filled 2023-12-27: qty 90, 90d supply, fill #1

## 2023-08-06 MED ORDER — METFORMIN HCL ER 500 MG PO TB24
1500.0000 mg | ORAL_TABLET | Freq: Every day | ORAL | 1 refills | Status: DC
Start: 2023-10-04 — End: 2024-02-13
  Filled 2023-08-06: qty 270, 90d supply, fill #0
  Filled 2023-11-11: qty 270, 90d supply, fill #1

## 2023-08-06 MED ORDER — TIRZEPATIDE 7.5 MG/0.5ML ~~LOC~~ SOAJ
7.5000 mg | SUBCUTANEOUS | 0 refills | Status: DC
  Filled 2023-08-06: qty 6, 84d supply, fill #0
  Filled 2023-09-24: qty 2, 28d supply, fill #0
  Filled 2023-10-30: qty 2, 28d supply, fill #1
  Filled 2023-11-27: qty 2, 28d supply, fill #2

## 2023-08-06 MED ORDER — TADALAFIL 10 MG PO TABS
10.0000 mg | ORAL_TABLET | ORAL | 1 refills | Status: AC | PRN
  Filled 2023-08-06: qty 6, 30d supply, fill #0

## 2023-08-06 MED ORDER — TIRZEPATIDE 5 MG/0.5ML ~~LOC~~ SOAJ
5.0000 mg | SUBCUTANEOUS | 0 refills | Status: DC
  Filled 2023-08-06: qty 2, 28d supply, fill #0

## 2023-08-06 MED ORDER — DAPAGLIFLOZIN PROPANEDIOL 5 MG PO TABS
5.0000 mg | ORAL_TABLET | Freq: Every day | ORAL | 1 refills | Status: DC
  Filled 2023-08-06: qty 90, 90d supply, fill #0
  Filled 2023-11-01: qty 90, 90d supply, fill #1

## 2023-08-06 NOTE — Progress Notes (Unsigned)
Mark Black Head - 58 y.o. male MRN [id number]  Date of birth: 09/10/1965  Subjective No chief complaint on file.   HPI Mark Black is a 58 y.o. male here today for follow up visit.   He reports that he is ***.  He continues to see neurology for management of myasthenia.  Starting treatment with Privigen.  He remains on Ozempic 0.5mg , metfomrin and farxiga for management of diabetes.  His A1c is ***.  BP is elevated on initial check today.  He is taking medications as prescribed.  He denies symptoms related to HTN including chest pain, shortness of breath, palpitations, headache or vision changes.   Mood stable with Pristiq and bupropion.  No significant side effects from these at current strength.  No Known Allergies  Past Medical History:  Diagnosis Date  . Allergic rhinitis 12/24/2017  . Allergy   . Alopecia totalis   . Anxiety 12/24/2017  . Asthma    as a child  . Chicken pox   . Diabetes mellitus without complication (HCC)   . Elevated liver enzymes 10/13/2020  . Essential hypertension 02/10/2013  . Eyelid abnormality 04/23/2021  . GERD (gastroesophageal reflux disease) 01/27/2019  . Hypogonadism in male 06/26/2019  . Hypothyroidism 02/10/2013  . Lower urinary tract symptoms (LUTS) 12/24/2017  . Screening for colon cancer 12/24/2017  . Suspected COVID-19 virus infection 06/20/2020  . T2DM (type 2 diabetes mellitus) (HCC) 03/21/2018  . Vitiligo     Past Surgical History:  Procedure Laterality Date  . CARPAL TUNNEL RELEASE     Bil  . NASAL SINUS SURGERY  1994   cut and open air passages/nostrils    Social History   Socioeconomic History  . Marital status: Married    Spouse name: Not on file  . Number of children: 2  . Years of education: Not on file  . Highest education level: Not on file  Occupational History  . Not on file  Tobacco Use  . Smoking status: Never  . Smokeless tobacco: Never  Vaping Use  . Vaping status: Never Used  Substance and Sexual  Activity  . Alcohol use: Not Currently  . Drug use: Not Currently  . Sexual activity: Not on file  Other Topics Concern  . Not on file  Social History Narrative   Right Handed   Lives in a one story home with wife and son   Caffeine 2 cups coffee     Father passed away in his 62's from drowning   Work with [employer] representative   Social Drivers of Health   Financial Resource Strain: Not on file  Food Insecurity: Not on file  Transportation Needs: Not on file  Physical Activity: Not on file  Stress: Not on file  Social Connections: Not on file    Family History  Problem Relation Age of Onset  . Diabetes Father   . Hypertension Father   . Diabetes Paternal Aunt   . Cancer Maternal Aunt        breast  . Diabetes Sister   . Hypertension Sister   . Diabetes Brother   . Colon cancer Neg Hx   . Colon polyps Neg Hx   . Esophageal cancer Neg Hx   . Stomach cancer Neg Hx   . Rectal cancer Neg Hx     Health Maintenance  Topic Date Due  . Pneumococcal Vaccine 54-49 Years old (2 of 2 - PCV) 07/02/2019  . FOOT EXAM  04/23/2022  . OPHTHALMOLOGY EXAM  05/20/2022  . INFLUENZA VACCINE  02/11/2023  . COVID-19 Vaccine (3 - 2024-25 season) 03/14/2023  . Diabetic kidney evaluation - Urine ACR  04/28/2023  . HEMOGLOBIN A1C  07/30/2023  . Diabetic kidney evaluation - eGFR measurement  09/03/2023  . Hepatitis C Screening  01/27/2024 (Originally 01/11/1982)  . HIV Screening  01/27/2024 (Originally 01/12/1979)  . DTaP/Tdap/Td (2 - Td or Tdap) 01/27/2027  . Colonoscopy  05/27/2028  . Zoster Vaccines- Shingrix  Completed  . HPV VACCINES  Aged Out     ----------------------------------------------------------------------------------------------------------------------------------------------------------------------------------------------------------------- Physical Exam There were no vitals taken for this visit.  Physical  Exam  ------------------------------------------------------------------------------------------------------------------------------------------------------------------------------------------------------------------- Assessment and Plan  No problem-specific Assessment & Plan notes found for this encounter.   No orders of the defined types were placed in this encounter.   No follow-ups on file.    This visit occurred during the SARS-CoV-2 public health emergency.  Safety protocols were in place, including screening questions prior to the visit, additional usage of staff PPE, and extensive cleaning of exam room while observing appropriate contact time as indicated for disinfecting solutions.

## 2023-08-08 LAB — CBC WITH DIFFERENTIAL/PLATELET
Basophils Absolute: 0 10*3/uL (ref 0.0–0.2)
Basos: 0 %
EOS (ABSOLUTE): 0.1 10*3/uL (ref 0.0–0.4)
Eos: 1 %
Hematocrit: 49.4 % (ref 37.5–51.0)
Hemoglobin: 16.8 g/dL (ref 13.0–17.7)
Immature Grans (Abs): 0.1 10*3/uL (ref 0.0–0.1)
Immature Granulocytes: 1 %
Lymphocytes Absolute: 2.4 10*3/uL (ref 0.7–3.1)
Lymphs: 30 %
MCH: 30.5 pg (ref 26.6–33.0)
MCHC: 34 g/dL (ref 31.5–35.7)
MCV: 90 fL (ref 79–97)
Monocytes Absolute: 0.5 10*3/uL (ref 0.1–0.9)
Monocytes: 7 %
Neutrophils Absolute: 4.9 10*3/uL (ref 1.4–7.0)
Neutrophils: 61 %
Platelets: 174 10*3/uL (ref 150–450)
RBC: 5.51 x10E6/uL (ref 4.14–5.80)
RDW: 13 % (ref 11.6–15.4)
WBC: 8 10*3/uL (ref 3.4–10.8)

## 2023-08-08 LAB — CMP14+EGFR
ALT: 45 [IU]/L — ABNORMAL HIGH (ref 0–44)
AST: 33 [IU]/L (ref 0–40)
Albumin: 4.4 g/dL (ref 3.8–4.9)
Alkaline Phosphatase: 63 [IU]/L (ref 44–121)
BUN/Creatinine Ratio: 20 (ref 9–20)
BUN: 15 mg/dL (ref 6–24)
Bilirubin Total: 0.5 mg/dL (ref 0.0–1.2)
CO2: 20 mmol/L (ref 20–29)
Calcium: 9 mg/dL (ref 8.7–10.2)
Chloride: 95 mmol/L — ABNORMAL LOW (ref 96–106)
Creatinine, Ser: 0.75 mg/dL — ABNORMAL LOW (ref 0.76–1.27)
Globulin, Total: 2.8 g/dL (ref 1.5–4.5)
Glucose: 235 mg/dL — ABNORMAL HIGH (ref 70–99)
Potassium: 3.2 mmol/L — ABNORMAL LOW (ref 3.5–5.2)
Sodium: 136 mmol/L (ref 134–144)
Total Protein: 7.2 g/dL (ref 6.0–8.5)
eGFR: 104 mL/min/{1.73_m2} (ref >59)

## 2023-08-08 LAB — TSH: TSH: 0.405 u[IU]/mL — ABNORMAL LOW (ref 0.450–4.500)

## 2023-08-08 LAB — MICROALBUMIN / CREATININE URINE RATIO
Creatinine, Urine: 53.7 mg/dL
Microalb/Creat Ratio: 16 mg/g{creat} (ref 0–29)
Microalbumin, Urine: 8.5 ug/mL

## 2023-08-08 LAB — LIPID PANEL WITH LDL/HDL RATIO
Cholesterol, Total: 138 mg/dL (ref 100–199)
HDL: 40 mg/dL (ref >39)
LDL Chol Calc (NIH): 43 mg/dL (ref 0–99)
LDL/HDL Ratio: 1.1 {ratio} (ref 0.0–3.6)
Triglycerides: 376 mg/dL — ABNORMAL HIGH (ref 0–149)
VLDL Cholesterol Cal: 55 mg/dL — ABNORMAL HIGH (ref 5–40)

## 2023-08-08 LAB — TESTOSTERONE: Testosterone: 246 ng/dL — ABNORMAL LOW (ref 264–916)

## 2023-08-08 LAB — PSA: Prostate Specific Ag, Serum: 0.5 ng/mL (ref 0.0–4.0)

## 2023-08-09 ENCOUNTER — Encounter: Payer: Self-pay | Admitting: Family Medicine

## 2023-08-09 ENCOUNTER — Other Ambulatory Visit (HOSPITAL_COMMUNITY): Payer: Self-pay

## 2023-08-09 ENCOUNTER — Other Ambulatory Visit: Payer: Self-pay | Admitting: Family Medicine

## 2023-08-09 DIAGNOSIS — E039 Hypothyroidism, unspecified: Secondary | ICD-10-CM

## 2023-08-09 DIAGNOSIS — N529 Male erectile dysfunction, unspecified: Secondary | ICD-10-CM | POA: Insufficient documentation

## 2023-08-09 MED ORDER — LEVOTHYROXINE SODIUM 175 MCG PO TABS
175.0000 ug | ORAL_TABLET | Freq: Every day | ORAL | 0 refills | Status: DC
  Filled 2023-08-09: qty 90, 90d supply, fill #0

## 2023-08-09 NOTE — Assessment & Plan Note (Signed)
Adding tadalafil as needed.  Side effects reviewed.

## 2023-08-09 NOTE — Assessment & Plan Note (Signed)
He is continue to taper off steroids.  Receiving IVIG treatments.

## 2023-08-09 NOTE — Assessment & Plan Note (Signed)
Has had some decreased libido.  Previously on Clomid.  Update testosterone levels.

## 2023-08-09 NOTE — Assessment & Plan Note (Signed)
A1c is increased.  Changing Ozempic to [facility].  Continue all other medications for management of diabetes.

## 2023-08-09 NOTE — Assessment & Plan Note (Signed)
BP is well controlled.  Continue current medications at current strength for management of HTN.

## 2023-08-10 ENCOUNTER — Other Ambulatory Visit (HOSPITAL_COMMUNITY): Payer: Self-pay

## 2023-08-10 ENCOUNTER — Encounter: Payer: Self-pay | Admitting: Neurology

## 2023-08-11 ENCOUNTER — Other Ambulatory Visit (HOSPITAL_COMMUNITY): Payer: Self-pay

## 2023-08-11 DIAGNOSIS — H04123 Dry eye syndrome of bilateral lacrimal glands: Secondary | ICD-10-CM | POA: Diagnosis not present

## 2023-08-11 DIAGNOSIS — H18552 Macular corneal dystrophy, left eye: Secondary | ICD-10-CM | POA: Diagnosis not present

## 2023-08-11 DIAGNOSIS — H2513 Age-related nuclear cataract, bilateral: Secondary | ICD-10-CM | POA: Diagnosis not present

## 2023-08-11 DIAGNOSIS — G7000 Myasthenia gravis without (acute) exacerbation: Secondary | ICD-10-CM | POA: Diagnosis not present

## 2023-08-11 LAB — HM DIABETES EYE EXAM

## 2023-08-15 ENCOUNTER — Encounter: Payer: Self-pay | Admitting: Family Medicine

## 2023-08-18 ENCOUNTER — Ambulatory Visit (INDEPENDENT_AMBULATORY_CARE_PROVIDER_SITE_OTHER): Payer: 59

## 2023-08-18 VITALS — BP 155/85 | HR 97 | Temp 98.2°F | Resp 18 | Ht 71.0 in | Wt 195.8 lb

## 2023-08-18 DIAGNOSIS — G7000 Myasthenia gravis without (acute) exacerbation: Secondary | ICD-10-CM | POA: Diagnosis not present

## 2023-08-18 MED ORDER — ACETAMINOPHEN 325 MG PO TABS
650.0000 mg | ORAL_TABLET | Freq: Once | ORAL | Status: AC
Start: 2023-08-18 — End: 2023-08-18
  Administered 2023-08-18: 650 mg via ORAL
  Filled 2023-08-18: qty 2

## 2023-08-18 MED ORDER — IMMUNE GLOBULIN (HUMAN) 5 GM/50ML IV SOLN
1.0000 g/kg | Freq: Once | INTRAVENOUS | Status: AC
  Administered 2023-08-18: 75 g via INTRAVENOUS

## 2023-08-18 MED ORDER — DEXTROSE 5 % IV SOLN
INTRAVENOUS | Status: DC
Start: 2023-08-18 — End: 2023-08-18

## 2023-08-18 MED ORDER — DIPHENHYDRAMINE HCL 25 MG PO CAPS
25.0000 mg | ORAL_CAPSULE | Freq: Once | ORAL | Status: AC
  Administered 2023-08-18: 25 mg via ORAL
  Filled 2023-08-18: qty 1

## 2023-08-18 NOTE — Progress Notes (Signed)
 Diagnosis: Myasthenia Gravis  Provider:  Praveen Mannam MD  Procedure: IV Infusion  IV Type: Peripheral, IV Location: L Forearm  IVIG (Immune Globulin ), Dose: 75g  Infusion Start Time: 0944  Infusion Stop Time: 1416  Post Infusion IV Care: Patient declined observation  Discharge: Condition: Good, Destination: Home . AVS Declined  Performed by:  Harrie Cazarez, RN

## 2023-08-25 ENCOUNTER — Ambulatory Visit (INDEPENDENT_AMBULATORY_CARE_PROVIDER_SITE_OTHER): Payer: 59 | Admitting: Neurology

## 2023-08-25 ENCOUNTER — Telehealth: Payer: Self-pay | Admitting: Family Medicine

## 2023-08-25 ENCOUNTER — Other Ambulatory Visit: Payer: Self-pay | Admitting: Neurology

## 2023-08-25 VITALS — BP 116/87 | HR 112 | Ht 71.0 in | Wt 197.0 lb

## 2023-08-25 DIAGNOSIS — R29898 Other symptoms and signs involving the musculoskeletal system: Secondary | ICD-10-CM | POA: Diagnosis not present

## 2023-08-25 DIAGNOSIS — G7001 Myasthenia gravis with (acute) exacerbation: Secondary | ICD-10-CM | POA: Diagnosis not present

## 2023-08-25 NOTE — Patient Instructions (Signed)
Reduce prednisone to 10mg  daily.  We will contact infusion center to stop IVIG and switch you to Vyvgart Hytrulo.

## 2023-08-25 NOTE — Progress Notes (Signed)
Follow-up Visit   Date: 08/25/23   Mark Black MRN: [id number] DOB: 07/17/1965   Interim History: Mark Black is a 58 y.o. 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  The patient was accompanied to the clinic by wife.   IMPRESSION/PLAN: Seropositive myasthenia gravis with exacerbation, thymoma negative (2023).  He was initially on prednisone 30mg  did not respond, so Vyvgart Hytrulo was started in May 2024 and he has marked improvement.  During the fall 2024, prednisone was tapered down to 10mg  daily and he began to have an exacerbation.  Symptoms did not improve with higher dose of prednisone, so we attempted to restart Vyvgart Hytrulo, however, insurance denied this.  In January 2025, he received IVIG which provided mild improvement.  He continues to have moderate bulbar symptoms.  MG history:  - 08/2021 presented with ptosis and diplopia, symptoms initially controlled on mestinon prn  - July 2023 worsening ocular symptoms.  Started prednisone.  - Sept 2023 prednisone increased to 25mg /d which improved ptosis, mestinon stopped due to GI side effects  - Jan 2024, started prednisone taper  - April 2024, worsening generalized symptoms  - May - June 2024, Vyvgart Hytrulo  - Jan 2025 - IVIG, mild improvement  2.  Left hand weakness and numbness.  ?ulnar nerve entrapment vs cervical radiculopathy.   PLAN/RECOMMENDATIONS:  Switch immunotherapy to Vyvgart Hytrulo due to limited benefit with IVIG Reduce prednisone to 10mg  daily.  No significant benefit with worsening hyperglycemia NCS/EMG of the left arm   Return to clinic in 2 months  ------------------------------------------- History of present illness: Starting around the fall of 2022, he began having intermittent droopiness of the left eyelid.  It  can occur several days of the week and when present, it will be constant all day.  Rest does not seem to make any improvement, as he can wake up with it.  No double vision, difficulty swallowing/talking, jaw fatigue, or limb weakness.  He was seen by his ophthalmologist who referred him for evaluation of myasthenia gravis.    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    UPDATE 11/25/2022:  He was last seen in March at which time MG was very well controlled on prednisone 10mg  daily.  Unfortunately, over the past month, he reports generalized weakness, worsening double vision and droopiness of the eyelids.  His eyelids get very droopy, especially on the left where it can remain completely closed.  His right eyelid is not as severe. Double vision improved with closing one eye.   He has jaw fatigue and intermittent difficulty with swallowing.  No shortness of breath or slurred speech.  He increased prednisone to 20mg  daily but did not appreciate any benefit.  His HbA1c is 10 and is his currently not monitoring blood glucose.  He was recently started on Ozepmic.  No recent illnesses.   UPDATE 06/15/2023:  He was last seen in May and missed prior follow-up visit.  He reports after starting Vyvgart Hytrulo his symptoms significantly improved.  He was on prednisone 15mg  and when he tapered down to 10mg  every other day, his symptoms returned.  Since October, he has been on prednisone 20mg  daily without any benefit.  He continues to have severe droopiness and double vision.  He has some weakness  in the arms and legs.  No difficulty with swallowing/speech. He is not taking mestinon due to GI side effects.   UPDATE 08/25/2023:  He is here for follow-up visit.  He received IVIG in January and started to see some improvement about two week later, but symptoms did not completely get better.  He continues to  have droopiness of the eyelids daily, which does not completely close, but will get to where it covers half of his eye at times.  He is able to drive again.  He has not noticed any benefit on prednisone 20mg  and reports diabetes is getting worse with last HbA1c 8.8.  They would like to consider lowering the dose.   For the past two months, he also complains of left hand weakness and numbness.  He has dropped things occasionally.    Medications:  Current Outpatient Medications on File Prior to Visit  Medication Sig Dispense Refill   amLODipine-benazepril (LOTREL) 10-40 MG capsule TAKE 1 CAPSULE BY MOUTH ONCE DAILY 90 capsule 1   [START ON 09/14/2023] buPROPion (WELLBUTRIN XL) 150 MG 24 hr tablet Take 1 tablet (150 mg total) by mouth daily. 90 tablet 1   calcium carbonate (OS-CAL) 600 MG TABS tablet Take 1 tablet (600 mg total) by mouth 2 (two) times daily with a meal. 60 tablet 5   [START ON 11/04/2023] dapagliflozin propanediol (FARXIGA) 5 MG TABS tablet Take 1 tablet (5 mg total) by mouth daily. 90 tablet 1   desvenlafaxine (PRISTIQ) 100 MG 24 hr tablet Take 1 tablet (100 mg total) by mouth daily. 90 tablet 0   [START ON 10/30/2023] levothyroxine (SYNTHROID) 175 MCG tablet Take 1 tablet (175 mcg total) by mouth daily before breakfast. 90 tablet 0   [START ON 10/04/2023] metFORMIN (GLUCOPHAGE-XR) 500 MG 24 hr tablet Take 3 tablets (1,500 mg total) by mouth daily with breakfast. 270 tablet 1   Multiple Vitamins-Minerals (MULTIVITAMIN ADULT PO) Take 1 tablet by mouth daily.     pantoprazole (PROTONIX) 40 MG tablet Take 1 tablet (40 mg total) by mouth daily. 90 tablet 2   predniSONE (DELTASONE) 10 MG tablet Take 2 tablets (20 mg total) by mouth daily. 60 tablet 3   tadalafil (CIALIS) 10 MG tablet Take 1-2 tablets (10-20 mg total) by mouth every other day as needed for erectile dysfunction. 15 tablet 1   tamsulosin (FLOMAX) 0.4 MG CAPS capsule Take 1 capsule (0.4 mg total) by mouth daily. 90 capsule 3    tirzepatide (MOUNJARO) 5 MG/0.5ML Pen Inject 5 mg into the skin once a week. Increase to 7.5mg  after 4 weeks 2 mL 0   tirzepatide (MOUNJARO) 7.5 MG/0.5ML Pen Inject 7.5 mg into the skin once a week. 6 mL 0   Current Facility-Administered Medications on File Prior to Visit  Medication Dose Route Frequency Provider Last Rate Last Admin   0.9 %  sodium chloride infusion  500 mL Intravenous Once Armbruster, Willaim Rayas, MD        Allergies: No Known Allergies  Vital Signs:  BP 116/87   Pulse (!) 112   Ht 5\' 11"  (1.803 m)   Wt 197 lb (89.4 kg)   SpO2 98%   BMI 27.48 kg/m    Neurological Exam: MENTAL STATUS including orientation to time, place, person, recent and remote memory, attention span and concentration, language, and fund of knowledge is normal.  Speech is not dysarthric.  CRANIAL NERVES:   Left eye is blind.  Extraocular muscles are intact.  Moderate right ptosis,  mild left ptosis, significantly worse with sustained upgaze.  Face is symmetric. Palate elevates symmetrically.  Orbicularis oculi and buccinator is 4/5.  MOTOR:  Motor strength is 5-/5 in all extremities, without fatigability.  No atrophy, fasciculations or abnormal movements.  No pronator drift.  Tone is normal.    COORDINATION/GAIT:   Gait narrow based and stable.   Data: Myasthenia gravis panel 08/13/2021:  AChR blocking 40*, modulating 79*  Lab Results  Component Value Date   HGBA1C 8.8 (A) 08/06/2023   CT chest wwo contrast 09/12/2021:  Negative for thymoma  Total time spent reviewing records, interview, history/exam, documentation, and coordination of care on day of encounter:  40 min    Thank you for allowing me to participate in patient's care.  If I can answer any additional questions, I would be pleased to do so.    Sincerely,    Talissa Apple K. Allena Katz, DO

## 2023-08-25 NOTE — Telephone Encounter (Signed)
Pt requesting PA started for [facility]. Routing to clinical staff for review.   Copied from CRM [id number]. Topic: Clinical - Prescription Issue >> Aug 25, 2023 10:14 AM Carlatta H wrote: Reason for CRM: tirzepatide [facility]) 7.5 MG/0.5ML Pen [[id number] needs to be send for pre authorization with patients insurance

## 2023-08-25 NOTE — Telephone Encounter (Signed)
Prior auth for: MOUNJARO 5 & 7.5 MG  Determination: Pending as of 08/25/23 Auth #: [id number] Valid from: n/a

## 2023-08-30 ENCOUNTER — Other Ambulatory Visit (HOSPITAL_COMMUNITY): Payer: Self-pay

## 2023-09-01 ENCOUNTER — Encounter: Payer: Self-pay | Admitting: Neurology

## 2023-09-01 ENCOUNTER — Telehealth: Payer: Self-pay | Admitting: Pharmacy Technician

## 2023-09-01 NOTE — Telephone Encounter (Signed)
Auth Submission: APPROVED Site of care: Site of care: [facility] Payer: [employer] Medication & CPT/J Code(s) submitted: [employer] (IVIG) J1459 Route of submission (phone, fax, portal): PORTAL Phone # Fax # Auth type: Buy/Bill PB Units/visits requested:  Reference number: [id number] Approval from: 08/24/23 to 02/20/24

## 2023-09-02 NOTE — Telephone Encounter (Signed)
Dr. Allena Katz,  The Berkley Harvey has been submitted and is pending.  I will f/u once I have response from the insurance. Also I did not see a MG-ADL score and most times insurance will request a score.  Please provide a score to prevent delay in auth process  Thanks Selena Batten

## 2023-09-08 ENCOUNTER — Ambulatory Visit: Payer: BC Managed Care – PPO | Admitting: Neurology

## 2023-09-11 NOTE — Telephone Encounter (Signed)
 Prior auth for: MOUNJARO 5 & 7.5 MG  Determination: APPROVED Auth #: [id number] Valid from: 08/25/23 - 08/24/24

## 2023-09-15 ENCOUNTER — Ambulatory Visit: Payer: 59

## 2023-09-15 ENCOUNTER — Telehealth: Payer: Self-pay | Admitting: Neurology

## 2023-09-15 VITALS — BP 134/91 | HR 100 | Temp 98.3°F | Resp 16 | Ht 71.0 in | Wt 197.4 lb

## 2023-09-15 DIAGNOSIS — G7000 Myasthenia gravis without (acute) exacerbation: Secondary | ICD-10-CM

## 2023-09-15 DIAGNOSIS — G7001 Myasthenia gravis with (acute) exacerbation: Secondary | ICD-10-CM

## 2023-09-15 MED ORDER — IMMUNE GLOBULIN (HUMAN) 5 GM/50ML IV SOLN
1.0000 g/kg | Freq: Once | INTRAVENOUS | Status: AC
  Administered 2023-09-15: 75 g via INTRAVENOUS

## 2023-09-15 MED ORDER — ACETAMINOPHEN 325 MG PO TABS
650.0000 mg | ORAL_TABLET | Freq: Once | ORAL | Status: AC
  Administered 2023-09-15: 650 mg via ORAL
  Filled 2023-09-15: qty 2

## 2023-09-15 MED ORDER — DIPHENHYDRAMINE HCL 25 MG PO CAPS
25.0000 mg | ORAL_CAPSULE | Freq: Once | ORAL | Status: AC
Start: 2023-09-15 — End: 2023-09-15
  Administered 2023-09-15: 25 mg via ORAL
  Filled 2023-09-15: qty 1

## 2023-09-15 NOTE — Telephone Encounter (Signed)
 Orders placed for IVIG maintenance therapy.

## 2023-09-15 NOTE — Progress Notes (Signed)
 Diagnosis: Myasthenia Gravis  Provider:  Chilton Greathouse MD  Procedure: IV Infusion  IV Type: Peripheral, IV Location: L Upper Arm  IVIG (Immune Globulin), Dose: 75 g  Infusion Start Time: 0936  Infusion Stop Time: 1404  Post Infusion IV Care: Peripheral IV Discontinued  Discharge: Condition: Good, Destination: Home . AVS Declined  Performed by:  Wyvonne Lenz, RN

## 2023-09-15 NOTE — Telephone Encounter (Signed)
 Dr. Allena Katz, Berkley Harvey is still in pending status.  We will call insurance and get an update and f/u once we have a response.  Selena Batten

## 2023-09-16 ENCOUNTER — Encounter: Payer: Self-pay | Admitting: Family Medicine

## 2023-09-16 ENCOUNTER — Other Ambulatory Visit: Payer: Self-pay | Admitting: Family Medicine

## 2023-09-16 DIAGNOSIS — F419 Anxiety disorder, unspecified: Secondary | ICD-10-CM

## 2023-09-16 NOTE — Telephone Encounter (Signed)
 F/U:  Called this morning and auth is still pending.  [facility])

## 2023-09-17 ENCOUNTER — Other Ambulatory Visit (HOSPITAL_COMMUNITY): Payer: Self-pay

## 2023-09-17 MED ORDER — DESVENLAFAXINE SUCCINATE ER 100 MG PO TB24
100.0000 mg | ORAL_TABLET | Freq: Every day | ORAL | 1 refills | Status: DC
  Filled 2023-09-17: qty 90, 90d supply, fill #0
  Filled 2023-12-12: qty 90, 90d supply, fill #1

## 2023-09-20 ENCOUNTER — Other Ambulatory Visit (HOSPITAL_COMMUNITY): Payer: Self-pay

## 2023-09-23 ENCOUNTER — Telehealth: Payer: Self-pay | Admitting: Pharmacy Technician

## 2023-09-23 NOTE — Telephone Encounter (Addendum)
 Dr. Lydia Sams,  Patient has finally been approved for Vygart Hytrulo (yayy) Patient will be scheduled as soon as possible.  Auth Submission: APPROVED Site of care: Site of care: [facility] Payer: [employer] Medication & CPT/J Code(s) submitted:  VYVGART  HYTRULO Route of submission (phone, fax, portal): PORTAL Phone # Fax # Auth type: Buy/Bill PB Units/visits requested: 4 DOSES Reference number: [id number] Approval from: 09/09/23 to 03/08/24    Co-pay card: Pending [employer] aware.

## 2023-09-24 ENCOUNTER — Other Ambulatory Visit (HOSPITAL_COMMUNITY): Payer: Self-pay

## 2023-09-27 ENCOUNTER — Encounter: Payer: Self-pay | Admitting: Neurology

## 2023-09-27 ENCOUNTER — Encounter: Payer: Self-pay | Admitting: Family Medicine

## 2023-09-29 ENCOUNTER — Ambulatory Visit (INDEPENDENT_AMBULATORY_CARE_PROVIDER_SITE_OTHER): Admitting: Neurology

## 2023-09-29 ENCOUNTER — Other Ambulatory Visit (HOSPITAL_COMMUNITY): Payer: Self-pay

## 2023-09-29 ENCOUNTER — Encounter: Payer: Self-pay | Admitting: Neurology

## 2023-09-29 VITALS — BP 139/92 | HR 99 | Ht 71.0 in | Wt 196.0 lb

## 2023-09-29 DIAGNOSIS — H02402 Unspecified ptosis of left eyelid: Secondary | ICD-10-CM | POA: Diagnosis not present

## 2023-09-29 DIAGNOSIS — G7001 Myasthenia gravis with (acute) exacerbation: Secondary | ICD-10-CM | POA: Diagnosis not present

## 2023-09-29 DIAGNOSIS — R29898 Other symptoms and signs involving the musculoskeletal system: Secondary | ICD-10-CM

## 2023-09-29 MED ORDER — PREDNISONE 10 MG PO TABS
10.0000 mg | ORAL_TABLET | Freq: Every day | ORAL | 1 refills | Status: DC
  Filled 2023-09-29: qty 90, 90d supply, fill #0

## 2023-09-29 NOTE — Progress Notes (Signed)
 Follow-up Visit   Date: 09/29/23   Mark Black MRN: [id number] DOB: March 05, 1966   Interim History: Mark Black is a 58 y.o. 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  The patient was accompanied to the clinic by wife.   IMPRESSION/PLAN: Seropositive myasthenia gravis with exacerbation, thymoma negative (2023).  He was initially on prednisone 30mg  did not respond, so Vyvgart Hytrulo was started in May 2024 and he has marked improvement.  During the fall 2024, prednisone was tapered down to 10mg  daily and he began to have an exacerbation.  Symptoms did not improve with higher dose of prednisone, so we attempted to restart Vyvgart Hytrulo, however, insurance denied this.  In January 2025, he received IVIG which provided mild improvement.  He continues to have moderate bulbar symptoms.  MG history:  - 08/2021 presented with ptosis and diplopia, symptoms initially controlled on mestinon prn  - July 2023 worsening ocular symptoms.  Started prednisone.  - Sept 2023 prednisone increased to 25mg /d which improved ptosis, mestinon stopped due to GI side effects  - Jan 2024, started prednisone taper  - April 2024, worsening generalized symptoms  - May - June 2024, Vyvgart Hytrulo  - Jan 2025 - IVIG, mild improvement  2.  Bilateral hand weakness and numbness.  ?ulnar nerve entrapment vs cervical radiculopathy.   PLAN/RECOMMENDATIONS:  Vyvgart Hytrulo is scheduled for 3/26.  He did not have any benefit with IVIG Continue prednisone 10mg  daily NCS/EMG of the bilateral upper extremities  Return to clinic in 1 month  ------------------------------------------- History of present illness: Starting around the fall of 2022, he began having intermittent droopiness of the left eyelid.  It can occur several days of the  week and when present, it will be constant all day.  Rest does not seem to make any improvement, as he can wake up with it.  No double vision, difficulty swallowing/talking, jaw fatigue, or limb weakness.  He was seen by his ophthalmologist who referred him for evaluation of myasthenia gravis.    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    UPDATE 11/25/2022:  He was last seen in March at which time MG was very well controlled on prednisone 10mg  daily.  Unfortunately, over the past month, he reports generalized weakness, worsening double vision and droopiness of the eyelids.  His eyelids get very droopy, especially on the left where it can remain completely closed.  His right eyelid is not as severe. Double vision improved with closing one eye.   He has jaw fatigue and intermittent difficulty with swallowing.  No shortness of breath or slurred speech.  He increased prednisone to 20mg  daily but did not appreciate any benefit.  His HbA1c is 10 and is his currently not monitoring blood glucose.  He was recently started on Ozepmic.  No recent illnesses.   UPDATE 06/15/2023:  He was last seen in May and missed prior follow-up visit.  He reports after starting Vyvgart Hytrulo his symptoms significantly improved.  He was on prednisone 15mg  and when he tapered down to 10mg  every other day, his symptoms returned.  Since October, he has been on prednisone 20mg  daily without any benefit.  He continues to have severe droopiness and double vision.  He has some weakness in the arms and  legs.  No difficulty with swallowing/speech. He is not taking mestinon due to GI side effects.   UPDATE 08/25/2023:  He is here for follow-up visit.  He received IVIG in January and started to see some improvement about two week later, but symptoms did not completely get better.  He continues to have droopiness of the eyelids  daily, which does not completely close, but will get to where it covers half of his eye at times.  He is able to drive again.  He has not noticed any benefit on prednisone 20mg  and reports diabetes is getting worse with last HbA1c 8.8.  They would like to consider lowering the dose.   For the past two months, he also complains of left hand weakness and numbness.  He has dropped things occasionally.    UPDATE 09/29/2023:  He is here for follow-up visit.  He continues to have droopiness of the eyelids, which fluctuate.  He had double vision a few weeks ago.  He had IVIG on 3/5 and reports that eyelids were not as droopy, but this only lasted a few days and then went back to being droopy all the time.  He is on prednisone 10mg  daily.    Medications:  Current Outpatient Medications on File Prior to Visit  Medication Sig Dispense Refill   amLODipine-benazepril (LOTREL) 10-40 MG capsule TAKE 1 CAPSULE BY MOUTH ONCE DAILY 90 capsule 1   buPROPion (WELLBUTRIN XL) 150 MG 24 hr tablet Take 1 tablet (150 mg total) by mouth daily. 90 tablet 1   calcium carbonate (OS-CAL) 600 MG TABS tablet Take 1 tablet (600 mg total) by mouth 2 (two) times daily with a meal. 60 tablet 5   [START ON 11/04/2023] dapagliflozin propanediol (FARXIGA) 5 MG TABS tablet Take 1 tablet (5 mg total) by mouth daily. 90 tablet 1   desvenlafaxine (PRISTIQ) 100 MG 24 hr tablet Take 1 tablet (100 mg total) by mouth daily. 90 tablet 1   [START ON 10/30/2023] levothyroxine (SYNTHROID) 175 MCG tablet Take 1 tablet (175 mcg total) by mouth daily before breakfast. 90 tablet 0   [START ON 10/04/2023] metFORMIN (GLUCOPHAGE-XR) 500 MG 24 hr tablet Take 3 tablets (1,500 mg total) by mouth daily with breakfast. 270 tablet 1   Multiple Vitamins-Minerals (MULTIVITAMIN ADULT PO) Take 1 tablet by mouth daily.     pantoprazole (PROTONIX) 40 MG tablet Take 1 tablet (40 mg total) by mouth daily. 90 tablet 2   predniSONE (DELTASONE) 10 MG tablet Take 2 tablets  (20 mg total) by mouth daily. (Patient taking differently: Take 10 mg by mouth daily.) 60 tablet 3   tadalafil (CIALIS) 10 MG tablet Take 1-2 tablets (10-20 mg total) by mouth every other day as needed for erectile dysfunction. 15 tablet 1   tamsulosin (FLOMAX) 0.4 MG CAPS capsule Take 1 capsule (0.4 mg total) by mouth daily. 90 capsule 3   tirzepatide (MOUNJARO) 5 MG/0.5ML Pen Inject 5 mg into the skin once a week. Increase to 7.5mg  after 4 weeks 2 mL 0   tirzepatide (MOUNJARO) 7.5 MG/0.5ML Pen Inject 7.5 mg into the skin once a week. 6 mL 0   Current Facility-Administered Medications on File Prior to Visit  Medication Dose Route Frequency Provider Last Rate Last Admin   0.9 %  sodium chloride infusion  500 mL Intravenous Once Armbruster, Willaim Rayas, MD        Allergies: No Known Allergies  Vital Signs:  BP (!) 139/92   Pulse 99  Ht 5\' 11"  (1.803 m)   Wt 196 lb (88.9 kg)   SpO2 98%   BMI 27.34 kg/m    Neurological Exam: MENTAL STATUS including orientation to time, place, person, recent and remote memory, attention span and concentration, language, and fund of knowledge is normal.  Speech is not dysarthric.  CRANIAL NERVES:   Left eye is blind.  Extraocular muscles are intact.  Moderate-to-severe right ptosis, mild left ptosis, significantly worse with sustained upgaze.  Face is symmetric. Palate elevates symmetrically.  Orbicularis oculi and buccinator is 4/5.  MOTOR:  Motor strength is 5-/5 in all extremities, without fatigability.  No atrophy, fasciculations or abnormal movements.  No pronator drift.  Tone is normal.    COORDINATION/GAIT:   Gait narrow based and stable.   Data: Myasthenia gravis panel 08/13/2021:  AChR blocking 40*, modulating 79*  Lab Results  Component Value Date   HGBA1C 8.8 (A) 08/06/2023   CT chest wwo contrast 09/12/2021:  Negative for thymoma  Total time spent reviewing records, interview, history/exam, documentation, and coordination of care on day of  encounter:  20 minutes    Thank you for allowing me to participate in patient's care.  If I can answer any additional questions, I would be pleased to do so.    Sincerely,    Izell Labat K. Allena Katz, DO

## 2023-09-30 ENCOUNTER — Ambulatory Visit (INDEPENDENT_AMBULATORY_CARE_PROVIDER_SITE_OTHER): Payer: 59 | Admitting: Neurology

## 2023-09-30 DIAGNOSIS — M5412 Radiculopathy, cervical region: Secondary | ICD-10-CM

## 2023-09-30 DIAGNOSIS — R29898 Other symptoms and signs involving the musculoskeletal system: Secondary | ICD-10-CM

## 2023-09-30 DIAGNOSIS — G7001 Myasthenia gravis with (acute) exacerbation: Secondary | ICD-10-CM | POA: Diagnosis not present

## 2023-09-30 DIAGNOSIS — G5601 Carpal tunnel syndrome, right upper limb: Secondary | ICD-10-CM

## 2023-09-30 NOTE — Procedures (Signed)
 [facility]  [address] [place], [address]  [place], [place] [address] Tel: [phone] Fax: [phone] Test Date:  09/30/2023  Patient: Mark Black [facility] DOB: December 22, 1965 Physician: Nita Sickle, DO  Sex: Male Height: 5\' 11"  Ref Phys: Nita Sickle, DO  ID#: [id number]   Technician:    History: This is a 58 year old man referred for evaluation of bilateral hand weakness.  NCV & EMG Findings: Extensive electrodiagnostic testing of the right upper extremity and additional studies of the left shows:  Bilateral median, bilateral ulnar, and left mixed palmar sensory responses are within normal limits.  Right mixed palmar sensory response shows prolonged latency. Bilateral median and ulnar motor responses are within normal limits. Chronic motor axonal loss changes are seen affecting C7 myotome bilaterally, without accompanied active denervation.  Impression: Chronic C7 radiculopathy affecting bilateral upper extremities, mild. Right median neuropathy at or distal to the wrist, consistent with a clinical diagnosis of carpal tunnel syndrome.  Overall, these findings are very mild in degree electrically.   ___________________________ Nita Sickle, DO    Nerve Conduction Studies   Stim Site NR Peak (ms) Norm Peak (ms) O-P Amp (V) Norm O-P Amp  Left Median Anti Sensory (2nd Digit)  32 C  Wrist    3.5 <3.6 21.1 >15  Right Median Anti Sensory (2nd Digit)  32 C  Wrist    3.5 <3.6 16.8 >15  Left Ulnar Anti Sensory (5th Digit)  32 C  Wrist    2.8 <3.1 25.4 >10  Right Ulnar Anti Sensory (5th Digit)  32 C  Wrist    2.7 <3.1 24.8 >10     Stim Site NR Onset (ms) Norm Onset (ms) O-P Amp (mV) Norm O-P Amp Site1 Site2 Delta-0 (ms) Dist (cm) Vel (m/s) Norm Vel (m/s)  Left Median Motor (Abd Poll Brev)  32 C  Wrist    3.4 <4.0 8.3 >6 Elbow Wrist 5.3 30.0 57 >50  Elbow    8.7  7.8         Right Median Motor (Abd Poll Brev)  32 C  Wrist    4.0 <4.0 8.3 >6 Elbow Wrist 5.8 32.0  55 >50  Elbow    9.8  7.8         Left Ulnar Motor (Abd Dig Minimi)  32 C  Wrist    2.7 <3.1 11.0 >7 B Elbow Wrist 4.6 25.0 54 >50  B Elbow    7.3  10.8  A Elbow B Elbow 1.8 10.0 56 >50  A Elbow    9.1  10.5         Right Ulnar Motor (Abd Dig Minimi)  32 C  Wrist    2.8 <3.1 10.4 >7 B Elbow Wrist 4.3 24.0 56 >50  B Elbow    7.1  9.7  A Elbow B Elbow 1.6 10.0 63 >50  A Elbow    8.7  9.5            Stim Site NR Peak (ms) Norm Peak (ms) P-T Amp (V) Site1 Site2 Delta-P (ms) Norm Delta (ms)  Left Median/Ulnar Palm Comparison (Wrist - 8cm)  32 C  Median Palm    1.9 <2.2 121.5 Median Palm Ulnar Palm 0.2   Ulnar Palm    1.7 <2.2 11.2      Right Median/Ulnar Palm Comparison (Wrist - 8cm)  32 C  Median Palm    2.0 <2.2 72.0 Median Palm Ulnar Palm *0.6   Ulnar [employer]  1.4 <2.2 14.5       Electromyography   Side Muscle Ins.Act Fibs Fasc Recrt Amp Dur Poly Activation Comment  Left 1stDorInt Nml Nml Nml Nml Nml Nml Nml Nml N/A  Left Abd Poll Brev Nml Nml Nml Nml Nml Nml Nml Nml N/A  Left PronatorTeres Nml Nml Nml *1- *1+ *1+ *1+ Nml N/A  Left Biceps Nml Nml Nml Nml Nml Nml Nml Nml N/A  Left Triceps Nml Nml Nml *1- *1+ *1+ *1+ Nml N/A  Left Deltoid Nml Nml Nml Nml Nml Nml Nml Nml N/A  Right 1stDorInt Nml Nml Nml Nml Nml Nml Nml Nml N/A  Right Abd Poll Brev Nml Nml Nml Nml Nml Nml Nml Nml N/A  Right PronatorTeres Nml Nml Nml *1- *1+ *1+ *1+ Nml N/A  Right Biceps Nml Nml Nml Nml Nml Nml Nml Nml N/A  Right Triceps Nml Nml Nml *1- *1+ *1+ *1+ Nml N/A  Right Deltoid Nml Nml Nml Nml Nml Nml Nml Nml N/A      Waveforms:

## 2023-10-01 ENCOUNTER — Other Ambulatory Visit (HOSPITAL_COMMUNITY): Payer: Self-pay

## 2023-10-01 ENCOUNTER — Ambulatory Visit: Payer: Self-pay

## 2023-10-01 ENCOUNTER — Encounter: Payer: Self-pay | Admitting: Neurology

## 2023-10-01 ENCOUNTER — Encounter (INDEPENDENT_AMBULATORY_CARE_PROVIDER_SITE_OTHER): Payer: Self-pay | Admitting: Family Medicine

## 2023-10-01 DIAGNOSIS — U071 COVID-19: Secondary | ICD-10-CM | POA: Diagnosis not present

## 2023-10-01 MED ORDER — NIRMATRELVIR/RITONAVIR (PAXLOVID)TABLET
3.0000 | ORAL_TABLET | Freq: Two times a day (BID) | ORAL | 0 refills | Status: AC
  Filled 2023-10-01: qty 30, 5d supply, fill #0

## 2023-10-01 NOTE — Telephone Encounter (Signed)
 Copied From CRM [id number]. Reason for Triage: Patient tested positive for Covid- asking for medication [phone]   Called pt - call cannot be completed as dialed. Will try alternate number  Tried [phone] - call cannot be completed as dialed. Having unsuccessfully tried to contact pt 3 times - I will forward the encounter to clinic for follow up.

## 2023-10-01 NOTE — Telephone Encounter (Signed)
 Dr. Ashley Royalty has reached out to the patient. Nothing further needed at this time.

## 2023-10-01 NOTE — Telephone Encounter (Signed)
 Please see the MyChart message reply(ies) for my assessment and plan.    This patient gave consent for this Medical Advice Message and is aware that it may result in a bill to [employer], as well as the possibility of receiving a bill for a co-payment or deductible. They are an established patient, but are not seeking medical advice exclusively about a problem treated during an in person or video visit in the last seven days. I did not recommend an in person or video visit within seven days of my reply.    I spent a total of 6 minutes cumulative time within 7 days through [employer].  Everrett Coombe, DO

## 2023-10-01 NOTE — Telephone Encounter (Signed)
 Copied From CRM [id number]. Reason for Triage: Patient tested positive for Covid- asking for medication [phone]   Called pt - call cannot be completed as dialed. Checked number -  tried 2 times.

## 2023-10-01 NOTE — Telephone Encounter (Signed)
 Second attempt to contact patient-unable to speak or leave message. Recording "call cannot be completed as dialed."  Copied From CRM [id number]. Reason for Triage: Patient tested positive for Covid- asking for medication [phone]

## 2023-10-05 ENCOUNTER — Other Ambulatory Visit (HOSPITAL_COMMUNITY): Payer: Self-pay

## 2023-10-06 ENCOUNTER — Ambulatory Visit (INDEPENDENT_AMBULATORY_CARE_PROVIDER_SITE_OTHER)

## 2023-10-06 VITALS — BP 121/82 | HR 106 | Temp 98.1°F | Resp 18 | Ht 71.0 in | Wt 193.4 lb

## 2023-10-06 DIAGNOSIS — G7000 Myasthenia gravis without (acute) exacerbation: Secondary | ICD-10-CM | POA: Diagnosis not present

## 2023-10-06 MED ORDER — EFGARTIGIMOD ALFA-HYALUR-QVFC 180-2000 MG-UNIT/ML ~~LOC~~ SOLN
1008.0000 mg | Freq: Once | SUBCUTANEOUS | Status: AC
  Administered 2023-10-06: 1008 mg via SUBCUTANEOUS
  Filled 2023-10-06: qty 5.6

## 2023-10-06 NOTE — Progress Notes (Signed)
 Diagnosis: Myasthenia Gravis  Provider:  Chilton Greathouse MD  Procedure: Injection  EFGARTIGIMOD ALFA & HYALURONIDASE-QVFC (VYVGART HYTRULO) , Dose: 1008 mg , Site: subcutaneous, Number of injections: 1  Injection Site(s): Left upper quad. abdomen  Post Care: Observation period completed  Discharge: Condition: Good, Destination: Home . AVS Declined  Performed by:  Wyvonne Lenz, RN

## 2023-10-08 ENCOUNTER — Encounter: Payer: Self-pay | Admitting: Neurology

## 2023-10-13 ENCOUNTER — Ambulatory Visit (INDEPENDENT_AMBULATORY_CARE_PROVIDER_SITE_OTHER)

## 2023-10-13 VITALS — BP 119/82 | HR 97 | Temp 98.4°F | Resp 16 | Ht 71.0 in | Wt 193.4 lb

## 2023-10-13 DIAGNOSIS — G7000 Myasthenia gravis without (acute) exacerbation: Secondary | ICD-10-CM | POA: Diagnosis not present

## 2023-10-13 MED ORDER — EFGARTIGIMOD ALFA-HYALUR-QVFC 180-2000 MG-UNIT/ML ~~LOC~~ SOLN
1008.0000 mg | Freq: Once | SUBCUTANEOUS | Status: AC
Start: 2023-10-13 — End: 2023-10-13
  Administered 2023-10-13: 1008 mg via SUBCUTANEOUS
  Filled 2023-10-13: qty 5.6

## 2023-10-13 NOTE — Progress Notes (Signed)
 Diagnosis: Myasthenia Gravis  Provider:  Chilton Greathouse MD  Procedure: Injection  EFGARTIGIMOD ALFA & HYALURONIDASE-QVFC (VYVGART HYTRULO) , Dose: 1008 mg , Site: subcutaneous, Number of injections: 1  Injection Site(s): Right upper quad. abdomen  Post Care: Observation period completed  Discharge: Condition: Stable, Destination: Home . AVS Declined  Performed by:  Wyvonne Lenz, RN

## 2023-10-20 ENCOUNTER — Ambulatory Visit (INDEPENDENT_AMBULATORY_CARE_PROVIDER_SITE_OTHER)

## 2023-10-20 VITALS — BP 111/78 | HR 92 | Temp 97.8°F | Resp 16 | Ht 71.0 in | Wt 191.4 lb

## 2023-10-20 DIAGNOSIS — G7000 Myasthenia gravis without (acute) exacerbation: Secondary | ICD-10-CM

## 2023-10-20 MED ORDER — EFGARTIGIMOD ALFA-HYALUR-QVFC 180-2000 MG-UNIT/ML ~~LOC~~ SOLN
1008.0000 mg | Freq: Once | SUBCUTANEOUS | Status: AC
  Administered 2023-10-20: 1008 mg via SUBCUTANEOUS
  Filled 2023-10-20: qty 5.6

## 2023-10-20 NOTE — Progress Notes (Signed)
 Diagnosis: Myasthenia Gravis  Provider:  Chilton Greathouse MD  Procedure: Injection  Vyvgart, Dose: 1008mg  , Site: subcutaneous, Number of injections: 1  Injection Site(s): Right upper quad. abdomen  Post Care: Observation period completed  Discharge: Condition: Good, Destination: Home . AVS Declined  Performed by:  Nat Math, RN

## 2023-10-22 ENCOUNTER — Other Ambulatory Visit: Payer: Self-pay | Admitting: Neurology

## 2023-10-22 ENCOUNTER — Encounter: Payer: Self-pay | Admitting: Neurology

## 2023-10-22 NOTE — Telephone Encounter (Signed)
 Dr. Allena Katz, We will enter a treatment plan and I will submit a new auth for his next treatment cycle.  Thanks Selena Batten

## 2023-10-27 ENCOUNTER — Ambulatory Visit

## 2023-10-27 VITALS — BP 135/88 | HR 79 | Temp 98.1°F | Resp 16 | Ht 71.0 in | Wt 191.4 lb

## 2023-10-27 DIAGNOSIS — G7000 Myasthenia gravis without (acute) exacerbation: Secondary | ICD-10-CM | POA: Diagnosis not present

## 2023-10-27 MED ORDER — EFGARTIGIMOD ALFA-HYALUR-QVFC 180-2000 MG-UNIT/ML ~~LOC~~ SOLN
1008.0000 mg | Freq: Once | SUBCUTANEOUS | Status: AC
  Administered 2023-10-27: 1008 mg via SUBCUTANEOUS
  Filled 2023-10-27: qty 5.6

## 2023-10-27 NOTE — Progress Notes (Signed)
 Diagnosis: Myasthenia gravis without acute exacerbation   Provider:  Mannam, Praveen MD  Procedure: Injection  Vyvgart Hytrulo, Dose: 1008 mg , Site: subcutaneous, Number of injections: 1  Injection Site(s): Left lower quad. abdomen  Post Care: Observation period completed  Discharge: Condition: Good, Destination: Home . AVS Declined  Performed by:  Lauran Pollard, LPN

## 2023-11-01 ENCOUNTER — Other Ambulatory Visit: Payer: Self-pay

## 2023-11-01 ENCOUNTER — Other Ambulatory Visit: Payer: Self-pay | Admitting: Family Medicine

## 2023-11-01 ENCOUNTER — Other Ambulatory Visit (HOSPITAL_COMMUNITY): Payer: Self-pay

## 2023-11-01 DIAGNOSIS — E039 Hypothyroidism, unspecified: Secondary | ICD-10-CM

## 2023-11-01 MED ORDER — PANTOPRAZOLE SODIUM 40 MG PO TBEC
40.0000 mg | DELAYED_RELEASE_TABLET | Freq: Every day | ORAL | 2 refills | Status: AC
  Filled 2023-11-01 – 2023-12-27 (×2): qty 90, 90d supply, fill #0
  Filled 2024-04-24: qty 90, 90d supply, fill #1
  Filled 2024-08-01: qty 90, 90d supply, fill #2

## 2023-11-01 MED ORDER — LEVOTHYROXINE SODIUM 175 MCG PO TABS
175.0000 ug | ORAL_TABLET | Freq: Every day | ORAL | 0 refills | Status: DC
Start: 2023-11-01 — End: 2024-02-13
  Filled 2023-11-01: qty 90, 90d supply, fill #0

## 2023-11-02 ENCOUNTER — Other Ambulatory Visit (HOSPITAL_COMMUNITY): Payer: Self-pay

## 2023-11-05 NOTE — Telephone Encounter (Signed)
 Prior auth for: MOUNJARO 5 & 7.5 MG  Determination: APPROVED Auth #: [id number] Valid from: 08/25/23 - 08/24/24

## 2023-11-10 ENCOUNTER — Other Ambulatory Visit (HOSPITAL_COMMUNITY): Payer: Self-pay

## 2023-11-10 ENCOUNTER — Ambulatory Visit (INDEPENDENT_AMBULATORY_CARE_PROVIDER_SITE_OTHER): Payer: 59 | Admitting: Neurology

## 2023-11-10 ENCOUNTER — Encounter: Payer: Self-pay | Admitting: Neurology

## 2023-11-10 VITALS — BP 129/90 | HR 98 | Ht 71.0 in | Wt 189.0 lb

## 2023-11-10 DIAGNOSIS — M5412 Radiculopathy, cervical region: Secondary | ICD-10-CM | POA: Diagnosis not present

## 2023-11-10 DIAGNOSIS — G7001 Myasthenia gravis with (acute) exacerbation: Secondary | ICD-10-CM | POA: Diagnosis not present

## 2023-11-10 MED ORDER — PREDNISONE 10 MG PO TABS
20.0000 mg | ORAL_TABLET | Freq: Every day | ORAL | 1 refills | Status: DC
  Filled 2023-11-10: qty 120, 60d supply, fill #0
  Filled 2024-01-17: qty 120, 60d supply, fill #1

## 2023-11-10 NOTE — Progress Notes (Signed)
 Follow-up Visit   Date: 11/10/23   Mark Black MRN: [id number] DOB: 02-08-1966   Interim History: Mark Black is a 58 y.o. 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  The patient was accompanied to the clinic by wife.   IMPRESSION/PLAN: Seropositive myasthenia gravis with exacerbation, thymoma negative (2023).  He continues to have severe ptosis and diplopia almost causing functional blindness and did not notice marked benefit with the last cycle of Vyvgart  Hytrulo.   He was initially on prednisone  30mg  did not respond, so Vyvgart  Hytrulo was started in May 2024 and responded.   During the fall 2024, prednisone  was tapered down to 10mg  daily and he began to have an exacerbation.  Symptoms did not improve with higher dose of prednisone , so we attempted to restart Vyvgart  Hytrulo, however, insurance denied this.   January 2025, he received IVIG which provided mild improvement.  He continues to have moderate bulbar symptoms.   He completed second series of Vyvgart  Hytrulo in April 2025, but did not have marked benefit. .   MG history:  - 08/2021 presented with ptosis and diplopia, symptoms initially controlled on mestinon  prn  - July 2023 worsening ocular symptoms.  Started prednisone .  - Sept 2023 prednisone  increased to 25mg /d which improved ptosis, mestinon  stopped due to GI side effects  - Jan 2024, started prednisone  taper  - April 2024, worsening generalized symptoms  - May - June 2024, Vyvgart  Hytrulo  - Jan 2025 - IVIG, mild improvement  2.  Cervical radiculopathy causing left hand paresthesias   PLAN/RECOMMENDATIONS:  Continue Vyvagrt Hytrulo injections.  Last rec'd on 4/16, will redose in 4-6 weeks from last infusion.  MGFA 8.   Increase prednisone  to 20mg  daily.  Monitor blood glucose as  higher steroids will elevate blood glucose.  Start neck PT If he does not respond to another 1-2 cycles of Vygart Hytrulo, alternative options such as Rystiggo or Ultomiris may need to be considered.   Return to clinic in 5-6 weeks  ------------------------------------------- History of present illness: Starting around the fall of 2022, he began having intermittent droopiness of the left eyelid.  It can occur several days of the week and when present, it will be constant all day.  Rest does not seem to make any improvement, as he can wake up with it.  No double vision, difficulty swallowing/talking, jaw fatigue, or limb weakness.  He was seen by his ophthalmologist who referred him for evaluation of myasthenia gravis.    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    UPDATE 11/25/2022:  He was last seen in March at which time MG was very well controlled on prednisone  10mg  daily.  Unfortunately, over the past month, he reports generalized weakness, worsening double vision and droopiness of the eyelids.  His eyelids get very droopy, especially on the left where it can remain completely closed.  His right eyelid is not as severe. Double vision improved with closing one eye.   He has jaw fatigue and intermittent difficulty with swallowing.  No shortness of breath or slurred speech.  He increased prednisone  to 20mg  daily but did not appreciate any benefit.  His HbA1c is 10 and is his currently not monitoring blood glucose.  He was recently started on Ozepmic.  No recent illnesses.   UPDATE 06/15/2023:  He was last seen in May and missed prior follow-up visit.  He reports after starting Vyvgart  Hytrulo his symptoms significantly improved.  He was on prednisone  15mg  and when he tapered down to 10mg  every other day, his symptoms returned.  Since October, he has been on prednisone  20mg  daily  without any benefit.  He continues to have severe droopiness and double vision.  He has some weakness in the arms and legs.  No difficulty with swallowing/speech. He is not taking mestinon  due to GI side effects.   UPDATE 08/25/2023:  He is here for follow-up visit.  He received IVIG in January and started to see some improvement about two week later, but symptoms did not completely get better.  He continues to have droopiness of the eyelids daily, which does not completely close, but will get to where it covers half of his eye at times.  He is able to drive again.  He has not noticed any benefit on prednisone  20mg  and reports diabetes is getting worse with last HbA1c 8.8.  They would like to consider lowering the dose.   For the past two months, he also complains of left hand weakness and numbness.  He has dropped things occasionally.    UPDATE 09/29/2023:  He is here for follow-up visit.  He continues to have droopiness of the eyelids, which fluctuate.  He had double vision a few weeks ago.  He had IVIG on 3/5 and reports that eyelids were not as droopy, but this only lasted a few days and then went back to being droopy all the time.  He is on prednisone  10mg  daily.    UPDATE 11/10/2023:  He is here for follow-up visit.  He completed series of Vyvgart  Hytrulo on 4/16, and reports a few days of improved symptoms, but this was not lasting.  He continues to have constant double vision and ptosis.  He is also getting much more tired and unable to be active as previously.  He has not been to the gym in a month.  Arms and legs feel tired.  He has noticed skin can bruise easily.     Medications:  Current Outpatient Medications on File Prior to Visit  Medication Sig Dispense Refill   amLODipine -benazepril  (LOTREL ) 10-40 MG capsule TAKE 1 CAPSULE BY MOUTH ONCE DAILY 90 capsule 1   buPROPion  (WELLBUTRIN  XL) 150 MG 24 hr tablet Take 1 tablet (150 mg total) by mouth daily. 90 tablet 1   calcium  carbonate (OS-CAL)  600 MG TABS tablet Take 1 tablet (600 mg total) by mouth 2 (two) times daily with a meal. 60 tablet 5   dapagliflozin  propanediol (FARXIGA ) 5 MG TABS tablet Take 1 tablet (5 mg total) by mouth daily. 90 tablet 1   desvenlafaxine  (PRISTIQ ) 100 MG 24 hr tablet Take 1 tablet (100 mg total) by mouth daily. 90 tablet 1   levothyroxine  (SYNTHROID ) 175 MCG tablet Take 1 tablet (175 mcg total) by mouth daily before breakfast. 90 tablet 0   metFORMIN  (GLUCOPHAGE -XR) 500 MG 24 hr tablet Take 3 tablets (1,500 mg total) by mouth daily with breakfast. 270 tablet 1   Multiple Vitamins-Minerals (MULTIVITAMIN ADULT PO) Take 1 tablet by mouth daily.     pantoprazole  (PROTONIX ) 40 MG tablet Take 1 tablet (40 mg total) by mouth daily. 90 tablet 2   predniSONE  (DELTASONE ) 10 MG tablet Take 1 tablet (10 mg total) by mouth daily. 90 tablet 1   tadalafil  (  CIALIS ) 10 MG tablet Take 1-2 tablets (10-20 mg total) by mouth every other day as needed for erectile dysfunction. 15 tablet 1   tamsulosin  (FLOMAX ) 0.4 MG CAPS capsule Take 1 capsule (0.4 mg total) by mouth daily. 90 capsule 3   tirzepatide  (MOUNJARO ) 7.5 MG/0.5ML Pen Inject 7.5 mg into the skin once a week. 6 mL 0   Current Facility-Administered Medications on File Prior to Visit  Medication Dose Route Frequency Provider Last Rate Last Admin   0.9 %  sodium chloride  infusion  500 mL Intravenous Once Armbruster, Lendon Queen, MD        Allergies: No Known Allergies  Vital Signs:  BP (!) 129/90   Pulse 98   Ht 5\' 11"  (1.803 m)   Wt 189 lb (85.7 kg)   SpO2 97%   BMI 26.36 kg/m    Neurological Exam: MENTAL STATUS including orientation to time, place, person, recent and remote memory, attention span and concentration, language, and fund of knowledge is normal.  Speech is not dysarthric.  CRANIAL NERVES:   Left eye is blind.  Extraocular muscles are intact.  Moderate-to-severe right ptosis, mild left ptosis, significantly worse with sustained upgaze.  Face is  symmetric. Palate elevates symmetrically.  Orbicularis oculi and buccinator is 4/5.  MOTOR:  Motor strength is 5-/5 in all extremities, without fatigability.  No atrophy, fasciculations or abnormal movements.  No pronator drift.  Tone is normal.    COORDINATION/GAIT:   Gait narrow based and stable.   Data: Myasthenia gravis panel 08/13/2021:  AChR blocking 40*, modulating 79*  Lab Results  Component Value Date   HGBA1C 8.8 (A) 08/06/2023   CT chest wwo contrast 09/12/2021:  Negative for thymoma  NCS/EMG of the right arm 09/30/2023: Chronic C7 radiculopathy affecting bilateral upper extremities, mild. Right median neuropathy at or distal to the wrist, consistent with a clinical diagnosis of carpal tunnel syndrome.  Overall, these findings are very mild in degree electrically.   Thank you for allowing me to participate in patient's care.  If I can answer any additional questions, I would be pleased to do so.    Sincerely,    Kiamesha Samet K. Lydia Sams, DO

## 2023-11-10 NOTE — Patient Instructions (Signed)
 Increase prednisone  to 20mg  daily  We will follow-up with Vyvgart  Hytrulo  Referral placed to start neck PT

## 2023-11-15 ENCOUNTER — Telehealth: Payer: Self-pay | Admitting: Pharmacy Technician

## 2023-11-15 ENCOUNTER — Encounter: Payer: Self-pay | Admitting: Family Medicine

## 2023-11-15 DIAGNOSIS — E119 Type 2 diabetes mellitus without complications: Secondary | ICD-10-CM

## 2023-11-15 DIAGNOSIS — E039 Hypothyroidism, unspecified: Secondary | ICD-10-CM

## 2023-11-15 NOTE — Telephone Encounter (Addendum)
 New auth was submitted for new cycle (4 doses). Siegfried Dress has been cancelled due to current auth on file. Called to verify that medication will be covered for an additional 4 doses and was told its based on medical necessity (not doses)  Auth Submission: APPROVED Site of care: Site of care: [facility] Payer: [employer] Medication & CPT/J Code(s) submitted:  VYVGART  HYYTRULLO [id number] Route of submission (phone, fax, portal): FAXED Phone # Fax # Auth type: Buy/Bill PB Units/visits requested: X4  DOSES Reference number: [id number]  Ref: [id number] [id number] Approval from:  09/09/23 - 03/08/24

## 2023-11-21 NOTE — Therapy (Signed)
 OUTPATIENT PHYSICAL THERAPY CERVICAL EVALUATION   Patient Name: Mark Black MRN: [id number] DOB:1965-12-12, 58 y.o., male Today's Date: 11/22/2023  END OF SESSION:  PT End of Session - 11/22/23 1156     Visit Number 1    Date for PT Re-Evaluation 01/17/24    Authorization Type [place] [employer]ET[employer]    PT Start Time 1148    PT Stop Time 1238    PT Time Calculation (min) 50 min    Activity Tolerance Patient tolerated treatment well    Behavior During Therapy [facility] for tasks assessed/performed             Past Medical History:  Diagnosis Date   Allergic rhinitis 12/24/2017   Allergy    Alopecia totalis    Anxiety 12/24/2017   Asthma    as a child   Chicken pox    Diabetes mellitus without complication (HCC)    Elevated liver enzymes 10/13/2020   Essential hypertension 02/10/2013   Eyelid abnormality 04/23/2021   GERD (gastroesophageal reflux disease) 01/27/2019   Hypogonadism in male 06/26/2019   Hypothyroidism 02/10/2013   Lower urinary tract symptoms (LUTS) 12/24/2017   Screening for colon cancer 12/24/2017   Suspected COVID-19 virus infection 06/20/2020   T2DM (type 2 diabetes mellitus) (HCC) 03/21/2018   Vitiligo    Past Surgical History:  Procedure Laterality Date   CARPAL TUNNEL RELEASE     Bil   NASAL SINUS SURGERY  1994   cut and open air passages/nostrils   Patient Active Problem List   Diagnosis Date Noted   Erectile dysfunction 08/09/2023   Hemorrhoid 01/27/2023   Myasthenia gravis without acute exacerbation (HCC) 05/01/2022   Hypertriglyceridemia 10/24/2021   Allergy 10/23/2021   Alopecia totalis 10/23/2021   Asthma 10/23/2021   Chicken pox 10/23/2021   Diabetes mellitus without complication (HCC) 10/23/2021   Vitiligo 10/23/2021   Eyelid abnormality 04/23/2021   Elevated liver enzymes 10/13/2020   Suspected COVID-19 virus infection 06/20/2020   Hypogonadism in male 06/26/2019   GERD (gastroesophageal reflux disease) 01/27/2019   T2DM (type 2 diabetes  mellitus) (HCC) 03/21/2018   Anxiety 12/24/2017   Allergic rhinitis 12/24/2017   Lower urinary tract symptoms (LUTS) 12/24/2017   Screening for colon cancer 12/24/2017   Essential hypertension 02/10/2013   Hypothyroidism 02/10/2013    PCP: Adela Holter, DO   REFERRING PROVIDER: Daryel Ensign, DO   REFERRING DIAG:  [phone] (ICD-10-CM) - Cervical radiculopathy  G70.01 (ICD-10-CM) - Myasthenia gravis with exacerbation (HCC)    THERAPY DIAG:  Radiculopathy, cervical region - Plan: PT plan of care cert/re-cert  Muscle weakness (generalized) - Plan: PT plan of care cert/re-cert  Cramp and spasm - Plan: PT plan of care cert/re-cert  Rationale for Evaluation and Treatment: Rehabilitation  ONSET DATE: 5 months ago  SUBJECTIVE:  SUBJECTIVE STATEMENT: Started having weakness with left grip. It stays tense in the neck. He has had spasms in the left forearm as well. Forearm and hand are numb. Due to flare up of MG, he delayed seeking treatment for this. Also reports R shoulder pain.  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 Yes: NPRS scale: 7/10 Pain location: B neck Pain description: constant ache Aggravating factors: nothing that he has noticed Relieving factors: rest  PRECAUTIONS: None  RED FLAGS: None     WEIGHT BEARING RESTRICTIONS: No  FALLS:  Has patient fallen in last 6 months? No  LIVING ENVIRONMENT: Lives with: lives with their family  OCCUPATION: worked at [employer] but was layed off in [employer]  PLOF: Independent  PATIENT GOALS: get my strength back in my left arm  NEXT MD VISIT: 4-6 weeks  OBJECTIVE:  Note: Objective measures were  completed at Evaluation unless otherwise noted.  DIAGNOSTIC FINDINGS:   NCV test  PATIENT SURVEYS:  NDI 19 / 50 = 38.0 %  COGNITION: Overall cognitive status: Within functional limits for tasks assessed  SENSATION: Not tested  POSTURE: rounded shoulders  PALPATION: Left SO, brachioradialis   CERVICAL ROM: * pain  Active ROM A/PROM (deg) eval  Flexion full  Extension full  Right lateral flexion 28  Left lateral flexion 28*  Right rotation 50  Left rotation 40   (Blank rows = not tested)  UPPER EXTREMITY ROM: WNL   UPPER EXTREMITY MMT:  MMT Right eval Left eval  Shoulder flexion  4+  Shoulder extension    Shoulder abduction  4+  Shoulder adduction    Shoulder extension  5  Shoulder internal rotation    Shoulder external rotation    Middle trapezius    Lower trapezius    Elbow flexion  4+  Elbow extension  5  Wrist flexion  5  Wrist extension  3+  Wrist ulnar deviation    Wrist radial deviation    Wrist pronation  4  Wrist supination  4  Grip strength 80 30   (Blank rows = not tested)  CERVICAL SPECIAL TESTS:  Upper limb tension test (ULTT): Positive, Spurling's test: Negative, and Distraction test: Positive  ulnar and radial   TREATMENT DATE:                                                                                                                               11/22/23 See pt ed and HEP  Mechanical Cervical traction 60 sec on/20 sec off 18#/5# x 12 min Putty - green putty for grip strength  PATIENT EDUCATION:  Education details: PT eval findings, anticipated POC, initial HEP, and putty issued green med  Person educated: Patient Education method: Explanation, Demonstration, and Handouts Education comprehension: verbalized understanding and returned demonstration  HOME EXERCISE PROGRAM: TBD  ASSESSMENT:  CLINICAL IMPRESSION: Patient is a 58 y.o. male who was seen today for physical therapy evaluation and treatment for left cervcical  radiculopathy. He has a h/o of neck issue for over 5 years. He also has been experiencing a flare up of Myasthenia Gravis affecting his vision. Patient reports pain in the neck and left UE primarily in the forearm and hand. He has numbness here as well. He has positive ULTT, ulnar > radial. He has weakness in his L UE and grip and muscles spasms in the forearm affecting ADLs including his ability to work in [employer]. He will benefit from skilled PT to address these deficits.  Trial of manual traction provided some relief during assessment, so trial of mechanical traction was done as well. No immediate relief noted.   OBJECTIVE IMPAIRMENTS: decreased activity tolerance, decreased strength, increased fascial restrictions, increased muscle spasms, impaired flexibility, impaired sensation, impaired UE functional use, postural dysfunction, and pain.   ACTIVITY LIMITATIONS: carrying, lifting, sleeping, bathing, dressing, and hygiene/grooming  PARTICIPATION LIMITATIONS: meal prep, cleaning, laundry, driving, shopping, community activity, occupation, and yard work  PERSONAL FACTORS: Past/current experiences, Time since onset of injury/illness/exacerbation, and 1-2 comorbidities: DM and MG are also affecting patient's functional outcome.   REHAB POTENTIAL: Good  CLINICAL DECISION MAKING: Evolving/moderate complexity  EVALUATION COMPLEXITY: Moderate   GOALS: Goals reviewed with patient? Yes  SHORT TERM GOALS: Target date: 12/13/2023   Patient will be independent with initial HEP.  Baseline:  Goal status: INITIAL 2.  Patient to report improved sensation in his L UE by 30% to aid in lifting and carrying. Baseline:  Goal status: INITIAL  3.  Decreased pain in neck and UE by 30% or more to improve sleep. Baseline:  Goal status: INITIAL  4.  Pt to demo negative ULTT showing improved neural mobility Baseline:  Goal status: INITIAL    LONG TERM GOALS: Target date: 01/17/2024   Patient will be  independent with advanced/ongoing HEP to improve outcomes and carryover.  Baseline:  Goal status: INITIAL  2.  Patient will report 75% improvement in neck pain with ADLS.  Baseline:  Goal status: INITIAL  3.  report improved sensation in his L UE by 75% to aid in lifting and carrying. Baseline:  Goal status:INITIAL  4.  Patient will demonstrate improved left grip strength to 50# or better to normalize function with lifting. Baseline:  Goal status:INITIAL  5.  Patient will report 9 or less on NDI to demonstrate improved functional ability.  Baseline: 19 Goal status: INITIAL  6.  Patient will demonstrate improved UE strength to 5/5 to normalize ADLS.   Baseline:  Goal status: INITIAL     PLAN:  PT FREQUENCY: 2x/week  PT DURATION: 8 weeks  PLANNED INTERVENTIONS: 97164- PT Re-evaluation, 97110-Therapeutic exercises, 97530- Therapeutic activity, 97112- Neuromuscular re-education, 97535- Self Care, 09811- Manual therapy, [id number]- Electrical stimulation (unattended), Patient/Family education, Taping, Dry Needling, Joint mobilization, Spinal mobilization, Cryotherapy, and Moist heat  PLAN FOR NEXT SESSION: Assess response to traction and continue as indicated, Review and progress HEP, UE and grip strengthening, neck stabilization, manual/DN as indicated, spinal mobs  Jinx Mourning, PT  11/22/2023, 5:44 PM

## 2023-11-22 ENCOUNTER — Other Ambulatory Visit: Payer: Self-pay

## 2023-11-22 ENCOUNTER — Encounter: Payer: Self-pay | Admitting: Physical Therapy

## 2023-11-22 ENCOUNTER — Ambulatory Visit: Attending: Neurology | Admitting: Physical Therapy

## 2023-11-22 DIAGNOSIS — M6281 Muscle weakness (generalized): Secondary | ICD-10-CM | POA: Insufficient documentation

## 2023-11-22 DIAGNOSIS — R252 Cramp and spasm: Secondary | ICD-10-CM | POA: Diagnosis present

## 2023-11-22 DIAGNOSIS — M5412 Radiculopathy, cervical region: Secondary | ICD-10-CM | POA: Diagnosis present

## 2023-11-22 DIAGNOSIS — G7001 Myasthenia gravis with (acute) exacerbation: Secondary | ICD-10-CM | POA: Insufficient documentation

## 2023-11-25 ENCOUNTER — Ambulatory Visit: Admitting: Physical Therapy

## 2023-11-25 DIAGNOSIS — E119 Type 2 diabetes mellitus without complications: Secondary | ICD-10-CM | POA: Diagnosis not present

## 2023-11-25 DIAGNOSIS — E039 Hypothyroidism, unspecified: Secondary | ICD-10-CM | POA: Diagnosis not present

## 2023-11-25 DIAGNOSIS — M6281 Muscle weakness (generalized): Secondary | ICD-10-CM

## 2023-11-25 DIAGNOSIS — M5412 Radiculopathy, cervical region: Secondary | ICD-10-CM | POA: Diagnosis not present

## 2023-11-25 DIAGNOSIS — R252 Cramp and spasm: Secondary | ICD-10-CM

## 2023-11-25 NOTE — Therapy (Signed)
 OUTPATIENT PHYSICAL THERAPY CERVICAL PROGRESS NOTE   Patient Name: Mark Black MRN: [id number] DOB:1965-11-09, 58 y.o., male Today's Date: 11/25/2023  END OF SESSION:  PT End of Session - 11/25/23 0930     Visit Number 2    Date for PT Re-Evaluation 01/17/24    Authorization Type [place] [employer]    PT Start Time 0930    PT Stop Time 1010    PT Time Calculation (min) 40 min    Activity Tolerance Patient tolerated treatment well             Past Medical History:  Diagnosis Date   Allergic rhinitis 12/24/2017   Allergy    Alopecia totalis    Anxiety 12/24/2017   Asthma    as a child   Chicken pox    Diabetes mellitus without complication (HCC)    Elevated liver enzymes 10/13/2020   Essential hypertension 02/10/2013   Eyelid abnormality 04/23/2021   GERD (gastroesophageal reflux disease) 01/27/2019   Hypogonadism in male 06/26/2019   Hypothyroidism 02/10/2013   Lower urinary tract symptoms (LUTS) 12/24/2017   Screening for colon cancer 12/24/2017   Suspected COVID-19 virus infection 06/20/2020   T2DM (type 2 diabetes mellitus) (HCC) 03/21/2018   Vitiligo    Past Surgical History:  Procedure Laterality Date   CARPAL TUNNEL RELEASE     Bil   NASAL SINUS SURGERY  1994   cut and open air passages/nostrils   Patient Active Problem List   Diagnosis Date Noted   Erectile dysfunction 08/09/2023   Hemorrhoid 01/27/2023   Myasthenia gravis without acute exacerbation (HCC) 05/01/2022   Hypertriglyceridemia 10/24/2021   Allergy 10/23/2021   Alopecia totalis 10/23/2021   Asthma 10/23/2021   Chicken pox 10/23/2021   Diabetes mellitus without complication (HCC) 10/23/2021   Vitiligo 10/23/2021   Eyelid abnormality 04/23/2021   Elevated liver enzymes 10/13/2020   Suspected COVID-19 virus infection 06/20/2020   Hypogonadism in male 06/26/2019   GERD (gastroesophageal reflux disease) 01/27/2019   T2DM (type 2 diabetes mellitus) (HCC) 03/21/2018   Anxiety 12/24/2017   Allergic  rhinitis 12/24/2017   Lower urinary tract symptoms (LUTS) 12/24/2017   Screening for colon cancer 12/24/2017   Essential hypertension 02/10/2013   Hypothyroidism 02/10/2013    PCP: Adela Holter, DO   REFERRING PROVIDER: Daryel Ensign, DO   REFERRING DIAG:  [phone] (ICD-10-CM) - Cervical radiculopathy  G70.01 (ICD-10-CM) - Myasthenia gravis with exacerbation (HCC)    THERAPY DIAG:  Radiculopathy, cervical region  Muscle weakness (generalized)  Cramp and spasm  Rationale for Evaluation and Treatment: Rehabilitation  ONSET DATE: 5 months ago  SUBJECTIVE:  SUBJECTIVE STATEMENT:  Traction helps somewhat; numbness and weakness in left arm constant basis;been  doing the Theraputty and weight ex's at home.  Sleeps on right side 90% of the time.     Started having weakness with left grip. It stays tense in the neck. He has had spasms in the left forearm as well. Forearm and hand are numb. Due to flare up of MG, he delayed seeking treatment for this. Also reports R shoulder pain.  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 Yes: NPRS scale: 5/10 Pain location: B neck left worse Pain description: constant ache Aggravating factors: nothing that he has noticed Relieving factors: rest  PRECAUTIONS: None  RED FLAGS: None     WEIGHT BEARING RESTRICTIONS: No  FALLS:  Has patient fallen in last 6 months? No  LIVING ENVIRONMENT: Lives with: lives with their family  OCCUPATION: worked at [employer] but was layed off in [employer]  PLOF: Independent  PATIENT GOALS: get my strength back in my left arm  NEXT MD VISIT: 4-6 weeks  OBJECTIVE:  Note: Objective  measures were completed at Evaluation unless otherwise noted.  DIAGNOSTIC FINDINGS:   NCV test some right RTC and pinched nerve in neck  PATIENT SURVEYS:  NDI 19 / 50 = 38.0 %  COGNITION: Overall cognitive status: Within functional limits for tasks assessed  SENSATION: Not tested  POSTURE: rounded shoulders  PALPATION: Left SO, brachioradialis   CERVICAL ROM: * pain  Active ROM A/PROM (deg) eval  Flexion full  Extension full  Right lateral flexion 28  Left lateral flexion 28*  Right rotation 50  Left rotation 40   (Blank rows = not tested)  UPPER EXTREMITY ROM: WNL   UPPER EXTREMITY MMT:  MMT Right eval Left eval  Shoulder flexion  4+  Shoulder extension    Shoulder abduction  4+  Shoulder adduction    Shoulder extension  5  Shoulder internal rotation    Shoulder external rotation    Middle trapezius    Lower trapezius    Elbow flexion  4+  Elbow extension  5  Wrist flexion  5  Wrist extension  3+  Wrist ulnar deviation    Wrist radial deviation    Wrist pronation  4  Wrist supination  4  Grip strength 80 30   (Blank rows = not tested)  CERVICAL SPECIAL TESTS:  Upper limb tension test (ULTT): Positive, Spurling's test: Negative, and Distraction test: Positive  ulnar and radial   TREATMENT DATE:     11/25/23  Discussion of putty and UE ex's with a weight for upper quarter strengthening Manual therapy: left > right cervical musculature Trigger Point Dry Needling Initial Treatment: Pt instructed on Dry Needling rational, procedures, and possible side effects. Pt instructed to expect mild to moderate muscle soreness later in the day and/or into the next day.  Pt instructed in methods to reduce muscle soreness. Pt instructed to continue prescribed HEP. Patient verbalized understanding of these instructions and education.  Patient Verbal Consent Given: Yes Education Handout Provided: Yes Muscles Treated: bil cervical multifidi; left upper trap; left  levator scap Electrical Stimulation Performed: No Treatment Response/Outcome: improved soft tissue mobility Discussed after care including heat for soreness Mechanical Cervical traction 45 sec on/15 sec off 18#/5# x 12 min  11/22/23 See pt ed and HEP  Mechanical Cervical traction 60 sec on/20 sec off 18#/5# x 12 min Putty - green putty for grip strength  PATIENT EDUCATION:  Education details: PT eval findings, anticipated POC, initial HEP, and putty issued green med  Person educated: Patient Education method: Explanation, Demonstration, and Handouts Education comprehension: verbalized understanding and returned demonstration  HOME EXERCISE PROGRAM: Putty, weight with wrist, forearm and elbow movements  ASSESSMENT:  CLINICAL IMPRESSION: The patient had a positive initial response to DN with much improved soft tissue mobility and decreased size and number of tender points.  Therapist monitoring response and educating patient on what to expect following DN and how to optimize benefit with exercise.  Some relief as well with mechanical traction with patient reporting less tingling in hand.  Therapist monitoring response during all treatment interventions.    Eval:  Patient is a 58 y.o. male who was seen today for physical therapy evaluation and treatment for left cervcical radiculopathy. He has a h/o of neck issue for over 5 years. He also has been experiencing a flare up of Myasthenia Gravis affecting his vision. Patient reports pain in the neck and left UE primarily in the forearm and hand. He has numbness here as well. He has positive ULTT, ulnar > radial. He has weakness in his L UE and grip and muscles spasms in the forearm affecting ADLs including his ability to work in [employer]. He will benefit from skilled PT to address these deficits.  Trial of manual traction  provided some relief during assessment, so trial of mechanical traction was done as well. No immediate relief noted.   OBJECTIVE IMPAIRMENTS: decreased activity tolerance, decreased strength, increased fascial restrictions, increased muscle spasms, impaired flexibility, impaired sensation, impaired UE functional use, postural dysfunction, and pain.   ACTIVITY LIMITATIONS: carrying, lifting, sleeping, bathing, dressing, and hygiene/grooming  PARTICIPATION LIMITATIONS: meal prep, cleaning, laundry, driving, shopping, community activity, occupation, and yard work  PERSONAL FACTORS: Past/current experiences, Time since onset of injury/illness/exacerbation, and 1-2 comorbidities: DM and MG are also affecting patient's functional outcome.   REHAB POTENTIAL: Good  CLINICAL DECISION MAKING: Evolving/moderate complexity  EVALUATION COMPLEXITY: Moderate   GOALS: Goals reviewed with patient? Yes  SHORT TERM GOALS: Target date: 12/13/2023   Patient will be independent with initial HEP.  Baseline:  Goal status: INITIAL 2.  Patient to report improved sensation in his L UE by 30% to aid in lifting and carrying. Baseline:  Goal status: INITIAL  3.  Decreased pain in neck and UE by 30% or more to improve sleep. Baseline:  Goal status: INITIAL  4.  Pt to demo negative ULTT showing improved neural mobility Baseline:  Goal status: INITIAL    LONG TERM GOALS: Target date: 01/17/2024   Patient will be independent with advanced/ongoing HEP to improve outcomes and carryover.  Baseline:  Goal status: INITIAL  2.  Patient will report 75% improvement in neck pain with ADLS.  Baseline:  Goal status: INITIAL  3.  report improved sensation in his L UE by 75% to aid in lifting and carrying. Baseline:  Goal status:INITIAL  4.  Patient will demonstrate improved left grip strength to 50# or better to normalize function with lifting. Baseline:  Goal status:INITIAL  5.  Patient will report 9 or less  on NDI to demonstrate improved functional ability.  Baseline: 19 Goal status: INITIAL  6.  Patient will demonstrate improved UE strength to 5/5 to normalize ADLS.   Baseline:  Goal status: INITIAL  PLAN:  PT FREQUENCY: 2x/week  PT DURATION: 8 weeks  PLANNED INTERVENTIONS: 97164- PT Re-evaluation, 97110-Therapeutic exercises, 97530- Therapeutic activity, 97112- Neuromuscular re-education, 97535- Self Care, 16109- Manual therapy, G0283- Electrical stimulation (unattended), Patient/Family education, Taping, Dry Needling, Joint mobilization, Spinal mobilization, Cryotherapy, and Moist heat  PLAN FOR NEXT SESSION: Assess response to traction and DN;  Review and progress HEP, UE and grip strengthening, neck stabilization, manual/DN as indicated, spinal mobs  Darien Eden, PT 11/25/23 10:13 AM Phone: [phone] Fax: [phone]

## 2023-11-25 NOTE — Patient Instructions (Signed)

## 2023-11-26 ENCOUNTER — Encounter: Payer: Self-pay | Admitting: Neurology

## 2023-11-26 LAB — BASIC METABOLIC PANEL WITH GFR
BUN/Creatinine Ratio: 20 (ref 9–20)
BUN: 15 mg/dL (ref 6–24)
CO2: 25 mmol/L (ref 20–29)
Calcium: 9.4 mg/dL (ref 8.7–10.2)
Chloride: 100 mmol/L (ref 96–106)
Creatinine, Ser: 0.74 mg/dL — ABNORMAL LOW (ref 0.76–1.27)
Glucose: 98 mg/dL (ref 70–99)
Potassium: 4.1 mmol/L (ref 3.5–5.2)
Sodium: 141 mmol/L (ref 134–144)
eGFR: 104 mL/min/{1.73_m2} (ref >59)

## 2023-11-26 LAB — HEMOGLOBIN A1C
Est. average glucose Bld gHb Est-mCnc: 114 mg/dL
Hgb A1c MFr Bld: 5.6 % (ref 4.8–5.6)

## 2023-11-26 LAB — TSH: TSH: 0.479 u[IU]/mL (ref 0.450–4.500)

## 2023-11-27 ENCOUNTER — Encounter: Payer: Self-pay | Admitting: Family Medicine

## 2023-11-30 ENCOUNTER — Other Ambulatory Visit: Payer: Self-pay | Admitting: Neurology

## 2023-11-30 ENCOUNTER — Telehealth: Payer: Self-pay | Admitting: Pharmacy Technician

## 2023-11-30 NOTE — Telephone Encounter (Signed)
 Verification:  Siegfried [employer] was sent on 11/12/23 and was cancelled due to current auth on file does not expire till 03/05/24. I called a 2nd time re-verify, no new Siegfried Dress is required. (It will be cancelled) Approval is based on medical necessity. Ref: [email or web address] 11:24a. The letter has been scanned to the media tab for review.  Burdette Carolin

## 2023-11-30 NOTE — Telephone Encounter (Signed)
 Dr. Lydia Sams, Patient has been scheduled for 12/14/23. Thanks Burdette Carolin

## 2023-12-01 ENCOUNTER — Encounter: Payer: Self-pay | Admitting: Physical Therapy

## 2023-12-01 ENCOUNTER — Ambulatory Visit: Admitting: Physical Therapy

## 2023-12-01 DIAGNOSIS — M5412 Radiculopathy, cervical region: Secondary | ICD-10-CM | POA: Diagnosis not present

## 2023-12-01 DIAGNOSIS — M6281 Muscle weakness (generalized): Secondary | ICD-10-CM

## 2023-12-01 DIAGNOSIS — R252 Cramp and spasm: Secondary | ICD-10-CM

## 2023-12-01 NOTE — Therapy (Signed)
 OUTPATIENT PHYSICAL THERAPY CERVICAL TREATMENT NOTE   Patient Name: Mark Black MRN: [id number] DOB:Aug 29, 1965, 59 y.o., male Today's Date: 12/01/2023  END OF SESSION:  PT End of Session - 12/01/23 0926     Visit Number 3    Date for PT Re-Evaluation 01/17/24    Authorization Type [place] [employer]    PT Start Time 0929    PT Stop Time 1022    PT Time Calculation (min) 53 min    Activity Tolerance Patient tolerated treatment well    Behavior During Therapy [facility] for tasks assessed/performed              Past Medical History:  Diagnosis Date   Allergic rhinitis 12/24/2017   Allergy    Alopecia totalis    Anxiety 12/24/2017   Asthma    as a child   Chicken pox    Diabetes mellitus without complication (HCC)    Elevated liver enzymes 10/13/2020   Essential hypertension 02/10/2013   Eyelid abnormality 04/23/2021   GERD (gastroesophageal reflux disease) 01/27/2019   Hypogonadism in male 06/26/2019   Hypothyroidism 02/10/2013   Lower urinary tract symptoms (LUTS) 12/24/2017   Screening for colon cancer 12/24/2017   Suspected COVID-19 virus infection 06/20/2020   T2DM (type 2 diabetes mellitus) (HCC) 03/21/2018   Vitiligo    Past Surgical History:  Procedure Laterality Date   CARPAL TUNNEL RELEASE     Bil   NASAL SINUS SURGERY  1994   cut and open air passages/nostrils   Patient Active Problem List   Diagnosis Date Noted   Erectile dysfunction 08/09/2023   Hemorrhoid 01/27/2023   Myasthenia gravis without acute exacerbation (HCC) 05/01/2022   Hypertriglyceridemia 10/24/2021   Allergy 10/23/2021   Alopecia totalis 10/23/2021   Asthma 10/23/2021   Chicken pox 10/23/2021   Diabetes mellitus without complication (HCC) 10/23/2021   Vitiligo 10/23/2021   Eyelid abnormality 04/23/2021   Elevated liver enzymes 10/13/2020   Suspected COVID-19 virus infection 06/20/2020   Hypogonadism in male 06/26/2019   GERD (gastroesophageal reflux disease) 01/27/2019   T2DM (type 2 diabetes  mellitus) (HCC) 03/21/2018   Anxiety 12/24/2017   Allergic rhinitis 12/24/2017   Lower urinary tract symptoms (LUTS) 12/24/2017   Screening for colon cancer 12/24/2017   Essential hypertension 02/10/2013   Hypothyroidism 02/10/2013    PCP: Adela Holter, DO   REFERRING PROVIDER: Daryel Ensign, DO   REFERRING DIAG:  [phone] (ICD-10-CM) - Cervical radiculopathy  G70.01 (ICD-10-CM) - Myasthenia gravis with exacerbation (HCC)    THERAPY DIAG:  Radiculopathy, cervical region  Muscle weakness (generalized)  Cramp and spasm  Rationale for Evaluation and Treatment: Rehabilitation  ONSET DATE: 5 months ago  SUBJECTIVE:  SUBJECTIVE STATEMENT:  I think I'm getting some mobility in my hand. Still don't have the strength. I think the traction and needling helped some.   Started having weakness with left grip. It stays tense in the neck. He has had spasms in the left forearm as well. Forearm and hand are numb. Due to flare up of MG, he delayed seeking treatment for this. Also reports R shoulder pain.  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 Yes: NPRS scale: 0/10 Pain location: B neck left worse Pain description: constant ache Aggravating factors: nothing that he has noticed Relieving factors: rest  PRECAUTIONS: None  RED FLAGS: None     WEIGHT BEARING RESTRICTIONS: No  FALLS:  Has patient fallen in last 6 months? No  LIVING ENVIRONMENT: Lives with: lives with their family  OCCUPATION: worked at [employer] but was layed off in [employer]  PLOF: Independent  PATIENT GOALS: get my strength back in my left arm  NEXT MD VISIT: 4-6 weeks  OBJECTIVE:   Note: Objective measures were completed at Evaluation unless otherwise noted.  DIAGNOSTIC FINDINGS:   NCV test some right RTC and pinched nerve in neck  PATIENT SURVEYS:  NDI 19 / 50 = 38.0 %  COGNITION: Overall cognitive status: Within functional limits for tasks assessed  SENSATION: Not tested  POSTURE: rounded shoulders  PALPATION: Left SO, brachioradialis   CERVICAL ROM: * pain  Active ROM A/PROM (deg) eval  Flexion full  Extension full  Right lateral flexion 28  Left lateral flexion 28*  Right rotation 50  Left rotation 40   (Blank rows = not tested)  UPPER EXTREMITY ROM: WNL   UPPER EXTREMITY MMT:  MMT Right eval Left eval  Shoulder flexion  4+  Shoulder extension    Shoulder abduction  4+  Shoulder adduction    Shoulder extension  5  Shoulder internal rotation    Shoulder external rotation    Middle trapezius    Lower trapezius    Elbow flexion  4+  Elbow extension  5  Wrist flexion  5  Wrist extension  3+  Wrist ulnar deviation    Wrist radial deviation    Wrist pronation  4  Wrist supination  4  Grip strength 80 30   (Blank rows = not tested)  CERVICAL SPECIAL TESTS:  Upper limb tension test (ULTT): Positive, Spurling's test: Negative, and Distraction test: Positive  ulnar and radial   TREATMENT DATE:     12/01/23 UBE L2 x 6 min 3 fwd/bwd - PT present to discuss status Doorway stretch at 90/90 2x 30 sec Nerve glides - ulnar and radial multiple variations tested - prayer best and added to HEP Manual: Trigger Point Dry Needling  Subsequent Treatment: Instructions provided previously at initial dry needling treatment.   Patient Verbal Consent Given: Yes Education Handout Provided: Previously Provided Muscles Treated: L ECRB, brachioradialis, triceps and UT Electrical Stimulation Performed: No Treatment Response/Outcome: Utilized skilled palpation to identify bony landmarks and trigger points.  Able to illicit twitch response and  muscle elongation.  Soft tissue mobilization to forearm and triceps to further promote tissue elongation and decreased pain.      Mechanical Cervical traction 45 sec on/15 sec off 20#/5# x 15 min   11/25/23  Discussion of putty and UE ex's with a weight for upper quarter strengthening Manual therapy: left >  right cervical musculature Trigger Point Dry Needling Initial Treatment: Pt instructed on Dry Needling rational, procedures, and possible side effects. Pt instructed to expect mild to moderate muscle soreness later in the day and/or into the next day.  Pt instructed in methods to reduce muscle soreness. Pt instructed to continue prescribed HEP. Patient verbalized understanding of these instructions and education.  Patient Verbal Consent Given: Yes Education Handout Provided: Yes Muscles Treated: bil cervical multifidi; left upper trap; left levator scap Electrical Stimulation Performed: No Treatment Response/Outcome: improved soft tissue mobility Discussed after care including heat for soreness Mechanical Cervical traction 45 sec on/15 sec off 18#/5# x 12 min                                                                                                                            11/22/23 See pt ed and HEP  Mechanical Cervical traction 60 sec on/20 sec off 18#/5# x 12 min Putty - green putty for grip strength  PATIENT EDUCATION:  Education details: PT eval findings, anticipated POC, initial HEP, and putty issued green med  Person educated: Patient Education method: Explanation, Demonstration, and Handouts Education comprehension: verbalized understanding and returned demonstration  HOME EXERCISE PROGRAM: Putty, weight with wrist, forearm and elbow movements Access Code: [email or web address] URL: [email or web address] Date: 12/01/2023 Prepared by: Concha Deed  Exercises - Ulnar Nerve Flossing  - 2 x daily - 7 x weekly - 2 sets - 10 reps - Doorway Pec Stretch at 90 Degrees  Abduction  - 1 x daily - 7 x weekly - 1 sets - 3 reps - 20-30 sec hold  ASSESSMENT:  CLINICAL IMPRESSION: Patient reporting some relief with traction and DN. Good response to forearm and triceps today with less tension and trigger points noted in UT. HEP updated with nerve flossing. Increased traction pull and time today. Patient reports no change in lower arm symptoms, but reports feeling looser in the neck at end of session.    Eval:  Patient is a 58 y.o. male who was seen today for physical therapy evaluation and treatment for left cervcical radiculopathy. He has a h/o of neck issue for over 5 years. He also has been experiencing a flare up of Myasthenia Gravis affecting his vision. Patient reports pain in the neck and left UE primarily in the forearm and hand. He has numbness here as well. He has positive ULTT, ulnar > radial. He has weakness in his L UE and grip and muscles spasms in the forearm affecting ADLs including his ability to work in [employer]. He will benefit from skilled PT to address these deficits.  Trial of manual traction provided some relief during assessment, so trial of mechanical traction was done as well. No immediate relief noted.   OBJECTIVE IMPAIRMENTS: decreased activity tolerance, decreased strength, increased fascial restrictions, increased muscle spasms, impaired flexibility, impaired sensation, impaired UE functional use, postural dysfunction, and pain.   ACTIVITY LIMITATIONS: carrying, lifting, sleeping, bathing, dressing, and hygiene/grooming  PARTICIPATION  LIMITATIONS: meal prep, cleaning, laundry, driving, shopping, community activity, occupation, and yard work  PERSONAL FACTORS: Past/current experiences, Time since onset of injury/illness/exacerbation, and 1-2 comorbidities: DM and MG are also affecting patient's functional outcome.   REHAB POTENTIAL: Good  CLINICAL DECISION MAKING: Evolving/moderate complexity  EVALUATION COMPLEXITY:  Moderate   GOALS: Goals reviewed with patient? Yes  SHORT TERM GOALS: Target date: 12/13/2023   Patient will be independent with initial HEP.  Baseline:  Goal status: INITIAL 2.  Patient to report improved sensation in his L UE by 30% to aid in lifting and carrying. Baseline:  Goal status: INITIAL  3.  Decreased pain in neck and UE by 30% or more to improve sleep. Baseline:  Goal status: INITIAL  4.  Pt to demo negative ULTT showing improved neural mobility Baseline:  Goal status: INITIAL    LONG TERM GOALS: Target date: 01/17/2024   Patient will be independent with advanced/ongoing HEP to improve outcomes and carryover.  Baseline:  Goal status: INITIAL  2.  Patient will report 75% improvement in neck pain with ADLS.  Baseline:  Goal status: INITIAL  3.  report improved sensation in his L UE by 75% to aid in lifting and carrying. Baseline:  Goal status:INITIAL  4.  Patient will demonstrate improved left grip strength to 50# or better to normalize function with lifting. Baseline:  Goal status:INITIAL  5.  Patient will report 9 or less on NDI to demonstrate improved functional ability.  Baseline: 19 Goal status: INITIAL  6.  Patient will demonstrate improved UE strength to 5/5 to normalize ADLS.   Baseline:  Goal status: INITIAL     PLAN:  PT FREQUENCY: 2x/week  PT DURATION: 8 weeks  PLANNED INTERVENTIONS: 97164- PT Re-evaluation, 97110-Therapeutic exercises, 97530- Therapeutic activity, 97112- Neuromuscular re-education, 97535- Self Care, 16109- Manual therapy, G0283- Electrical stimulation (unattended), Patient/Family education, Taping, Dry Needling, Joint mobilization, Spinal mobilization, Cryotherapy, and Moist heat  PLAN FOR NEXT SESSION:  Assess response to traction and DN;  Review nerve flossing and progress HEP as indicated, UE and grip strengthening, neck stabilization, manual/spinal mobs prn  Jinx Mourning, PT 12/01/23 12:39 PM Phone:  [phone] Fax: [phone]

## 2023-12-03 ENCOUNTER — Encounter: Payer: Self-pay | Admitting: Family Medicine

## 2023-12-03 ENCOUNTER — Other Ambulatory Visit (HOSPITAL_COMMUNITY): Payer: Self-pay

## 2023-12-03 ENCOUNTER — Ambulatory Visit (INDEPENDENT_AMBULATORY_CARE_PROVIDER_SITE_OTHER): Payer: 59 | Admitting: Family Medicine

## 2023-12-03 VITALS — BP 144/89 | HR 93 | Ht 71.0 in | Wt 186.0 lb

## 2023-12-03 DIAGNOSIS — G7000 Myasthenia gravis without (acute) exacerbation: Secondary | ICD-10-CM

## 2023-12-03 DIAGNOSIS — Z7984 Long term (current) use of oral hypoglycemic drugs: Secondary | ICD-10-CM

## 2023-12-03 DIAGNOSIS — I10 Essential (primary) hypertension: Secondary | ICD-10-CM

## 2023-12-03 DIAGNOSIS — E039 Hypothyroidism, unspecified: Secondary | ICD-10-CM

## 2023-12-03 DIAGNOSIS — E119 Type 2 diabetes mellitus without complications: Secondary | ICD-10-CM | POA: Diagnosis not present

## 2023-12-03 MED ORDER — FREESTYLE LIBRE 3 SENSOR MISC
1.0000 | 3 refills | Status: AC
  Filled 2023-12-03: qty 2, 28d supply, fill #0

## 2023-12-03 NOTE — Assessment & Plan Note (Addendum)
 Blood pressure is mildly elevated today and may be exacerbated by prednisone .  Encouraged to check blood pressure readings at home.

## 2023-12-03 NOTE — Assessment & Plan Note (Signed)
Feels well with current strength of levothyroxine.  Continue at current strength.

## 2023-12-03 NOTE — Progress Notes (Signed)
Mark Black - 58 y.o. male MRN [id number]  Date of birth: 02/28/1966  Subjective Chief Complaint  Patient presents with   Hypertension   Diabetes    HPI Mark Black is a 58 y.o. male here today for follow up visit.   He has had difficulty with controlling his myasthenia.  Neurology started him on steroids recently.  He has been monitoring blood sugars closely.  Recent A1c of 5.6%.  Doing well with current medications for management of diabetes.  He has had some disproportionate weakness in the left hand.  He had nerve conduction study showing a C7 radiculopathy as well as right median neuropathy.  No left median neuropathy.  He is doing physical therapy at this time.  BP is well controlled with current medications.  Denies chest pain, shortness of breath, palpitations, headache or vision changes.   Mood stable with pristiq  and bupropion  despite increase in stress.    ROS:  A comprehensive ROS was completed and negative except as noted per HPI  No Known Allergies  Past Medical History:  Diagnosis Date   Allergic rhinitis 12/24/2017   Allergy    Alopecia totalis    Anxiety 12/24/2017   Asthma    as a child   Chicken pox    Diabetes mellitus without complication (HCC)    Elevated liver enzymes 10/13/2020   Essential hypertension 02/10/2013   Eyelid abnormality 04/23/2021   GERD (gastroesophageal reflux disease) 01/27/2019   Hypogonadism in male 06/26/2019   Hypothyroidism 02/10/2013   Lower urinary tract symptoms (LUTS) 12/24/2017   Screening for colon cancer 12/24/2017   Suspected COVID-19 virus infection 06/20/2020   T2DM (type 2 diabetes mellitus) (HCC) 03/21/2018   Vitiligo     Past Surgical History:  Procedure Laterality Date   CARPAL TUNNEL RELEASE     Bil   NASAL SINUS SURGERY  1994   cut and open air passages/nostrils    Social History   Socioeconomic History   Marital status: Married    Spouse name: Not on file   Number of children: 2   Years of  education: Not on file   Highest education level: Not on file  Occupational History   Not on file  Tobacco Use   Smoking status: Never   Smokeless tobacco: Never  Vaping Use   Vaping status: Never Used  Substance and Sexual Activity   Alcohol use: Not Currently   Drug use: Not Currently   Sexual activity: Not on file  Other Topics Concern   Not on file  Social History Narrative   Right Handed   Lives in a one story home with wife and son   Caffeine 2 cups coffee     Father passed away in his 30's from drowning   Work with [employer] representative   Social Drivers of Corporate investment banker Strain: Not on file  Food Insecurity: Not on file  Transportation Needs: Not on file  Physical Activity: Not on file  Stress: Not on file  Social Connections: Not on file    Family History  Problem Relation Age of Onset   Diabetes Father    Hypertension Father    Diabetes Paternal Aunt    Cancer Maternal Aunt        breast   Diabetes Sister    Hypertension Sister    Diabetes Brother    Colon cancer Neg Hx    Colon polyps Neg Hx    Esophageal cancer Neg Hx  Stomach cancer Neg Hx    Rectal cancer Neg Hx     Health Maintenance  Topic Date Due   COVID-19 Vaccine (3 - 2024-25 season) 03/14/2023   Hepatitis C Screening  01/27/2024 (Originally 01/11/1982)   HIV Screening  01/27/2024 (Originally 01/12/1979)   INFLUENZA VACCINE  02/11/2024   HEMOGLOBIN A1C  05/27/2024   Diabetic kidney evaluation - Urine ACR  08/05/2024   FOOT EXAM  08/05/2024   OPHTHALMOLOGY EXAM  08/10/2024   Diabetic kidney evaluation - eGFR measurement  11/24/2024   DTaP/Tdap/Td (2 - Td or Tdap) 01/27/2027   Colonoscopy  05/27/2028   Pneumococcal Vaccine 61-76 Years old  Completed   Zoster Vaccines- Shingrix  Completed   HPV VACCINES  Aged Out   Meningococcal B Vaccine  Aged Out      ----------------------------------------------------------------------------------------------------------------------------------------------------------------------------------------------------------------- Physical Exam BP (!) 144/89 (BP Location: Left Arm, Patient Position: Sitting, Cuff Size: Normal)   Pulse 93   Ht 5\' 11"  (1.803 m)   Wt 186 lb (84.4 kg)   SpO2 99%   BMI 25.94 kg/m   Physical Exam Constitutional:      Appearance: Normal appearance.  HENT:     Head: Normocephalic and atraumatic.  Cardiovascular:     Rate and Rhythm: Normal rate and regular rhythm.  Neurological:     Mental Status: He is alert.     Comments: Weakness of the left upper eye lid.  Significant weakness of the left upper extremity.  Mild generalized weakness throughout.  Psychiatric:        Mood and Affect: Mood normal.        Behavior: Behavior normal.     ------------------------------------------------------------------------------------------------------------------------------------------------------------------------------------------------------------------- Assessment and Plan  T2DM (type 2 diabetes mellitus) (HCC) Blood sugars are well-controlled at this time.  Will add CGM to closely monitor his blood sugars as he is on daily prednisone  at this time.  Will plan to continue current medications.  Myasthenia gravis without acute exacerbation (HCC) He has having increased symptoms related to his myasthenia.  This is being managed by neurology.  He is on 20 mg of prednisone  daily and has upcoming infusion.  Hypothyroidism Feels well with current strength of levothyroxine .  Continue at current strength.   Essential hypertension Blood pressure is mildly elevated today and may be exacerbated by prednisone .  Encouraged to check blood pressure readings at home.   No orders of the defined types were placed in this encounter.   No follow-ups on file.

## 2023-12-03 NOTE — Assessment & Plan Note (Signed)
 Blood sugars are well-controlled at this time.  Will add CGM to closely monitor his blood sugars as he is on daily prednisone  at this time.  Will plan to continue current medications.

## 2023-12-03 NOTE — Assessment & Plan Note (Signed)
 He has having increased symptoms related to his myasthenia.  This is being managed by neurology.  He is on 20 mg of prednisone  daily and has upcoming infusion.

## 2023-12-12 ENCOUNTER — Other Ambulatory Visit: Payer: Self-pay | Admitting: Family Medicine

## 2023-12-12 DIAGNOSIS — I10 Essential (primary) hypertension: Secondary | ICD-10-CM

## 2023-12-12 NOTE — Therapy (Signed)
 OUTPATIENT PHYSICAL THERAPY CERVICAL TREATMENT NOTE   Patient Name: Mark Black MRN: [id number] DOB:[birth year], 58 y.o., male Today's Date: 12/13/2023  END OF SESSION:  PT End of Session - 12/13/23 1204     Visit Number 4    Date for PT Re-Evaluation 01/17/24    Authorization Type [place] [employer]ET[employer]    PT Start Time 1148    PT Stop Time 1234    PT Time Calculation (min) 46 min    Activity Tolerance Patient tolerated treatment well    Behavior During Therapy [facility] for tasks assessed/performed               Past Medical History:  Diagnosis Date   Allergic rhinitis 12/24/2017   Allergy    Alopecia totalis    Anxiety 12/24/2017   Asthma    as a child   Chicken pox    Diabetes mellitus without complication (HCC)    Elevated liver enzymes 10/13/2020   Essential hypertension 02/10/2013   Eyelid abnormality 04/23/2021   GERD (gastroesophageal reflux disease) 01/27/2019   Hypogonadism in male 06/26/2019   Hypothyroidism 02/10/2013   Lower urinary tract symptoms (LUTS) 12/24/2017   Screening for colon cancer 12/24/2017   Suspected COVID-19 virus infection 06/20/2020   T2DM (type 2 diabetes mellitus) (HCC) 03/21/2018   Vitiligo    Past Surgical History:  Procedure Laterality Date   CARPAL TUNNEL RELEASE     Bil   NASAL SINUS SURGERY  1994   cut and open air passages/nostrils   Patient Active Problem List   Diagnosis Date Noted   Erectile dysfunction 08/09/2023   Hemorrhoid 01/27/2023   Myasthenia gravis without acute exacerbation (HCC) 05/01/2022   Hypertriglyceridemia 10/24/2021   Allergy 10/23/2021   Alopecia totalis 10/23/2021   Asthma 10/23/2021   Chicken pox 10/23/2021   Diabetes mellitus without complication (HCC) 10/23/2021   Vitiligo 10/23/2021   Eyelid abnormality 04/23/2021   Elevated liver enzymes 10/13/2020   Suspected COVID-19 virus infection 06/20/2020   Hypogonadism in male 06/26/2019   GERD (gastroesophageal reflux disease) 01/27/2019   T2DM (type 2  diabetes mellitus) (HCC) 03/21/2018   Anxiety 12/24/2017   Allergic rhinitis 12/24/2017   Lower urinary tract symptoms (LUTS) 12/24/2017   Screening for colon cancer 12/24/2017   Essential hypertension 02/10/2013   Hypothyroidism 02/10/2013    PCP: Adela Holter, DO   REFERRING PROVIDER: Daryel Ensign, DO   REFERRING DIAG:  [phone] (ICD-10-CM) - Cervical radiculopathy  G70.01 (ICD-10-CM) - Myasthenia gravis with exacerbation (HCC)    THERAPY DIAG:  Radiculopathy, cervical region  Muscle weakness (generalized)  Cramp and spasm  Rationale for Evaluation and Treatment: Rehabilitation  ONSET DATE: 5 months ago  SUBJECTIVE:  SUBJECTIVE STATEMENT:  I have times when it seems better and then it seems worse. Not much help with the DN last time. I do think the traction loosens me up.   Started having weakness with left grip. It stays tense in the neck. He has had spasms in the left forearm as well. Forearm and hand are numb. Due to flare up of MG, he delayed seeking treatment for this. Also reports R shoulder pain.  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 Yes: NPRS scale: 0/10 Pain location: B neck left worse Pain description: constant ache Aggravating factors: nothing that he has noticed Relieving factors: rest  PRECAUTIONS: None  RED FLAGS: None     WEIGHT BEARING RESTRICTIONS: No  FALLS:  Has patient fallen in last 6 months? No  LIVING ENVIRONMENT: Lives with: lives with their family  OCCUPATION: worked at [employer] but was layed off in [employer]  PLOF: Independent  PATIENT GOALS: get my strength back in my left arm  NEXT MD VISIT: 4-6  weeks  OBJECTIVE:  Note: Objective measures were completed at Evaluation unless otherwise noted.  DIAGNOSTIC FINDINGS:   NCV test some right RTC and pinched nerve in neck  PATIENT SURVEYS:  NDI 19 / 50 = 38.0 %  COGNITION: Overall cognitive status: Within functional limits for tasks assessed  SENSATION: Not tested  POSTURE: rounded shoulders  PALPATION: Left SO, brachioradialis   CERVICAL ROM: * pain  Active ROM A/PROM (deg) eval  Flexion full  Extension full  Right lateral flexion 28  Left lateral flexion 28*  Right rotation 50  Left rotation 40   (Blank rows = not tested)  UPPER EXTREMITY ROM: WNL   UPPER EXTREMITY MMT:  MMT Right eval Left eval  Shoulder flexion  4+  Shoulder extension    Shoulder abduction  4+  Shoulder adduction    Shoulder extension  5  Shoulder internal rotation    Shoulder external rotation    Middle trapezius    Lower trapezius    Elbow flexion  4+  Elbow extension  5  Wrist flexion  5  Wrist extension  3+  Wrist ulnar deviation    Wrist radial deviation    Wrist pronation  4  Wrist supination  4  Grip strength 80 30   (Blank rows = not tested)  CERVICAL SPECIAL TESTS:  Upper limb tension test (ULTT): Positive, Spurling's test: Negative, and Distraction test: Positive  ulnar and radial   TREATMENT DATE:     12/13/23 UBE L2 x 6 min 3 fwd/bwd - PT present to discuss status Doorway stretch at 60 deg, 120 deg,  2x 30 sec ea Seated chin tucks 5 sec hold x 10, then into purple ball x 10 Standing scapular retraction x 10 on noodle --> L x 10, W x 10 Radial Nerve glides x 10 5 sec hold (see HEP) Blue Putty issued for HEP  Mechanical Cervical traction 45 sec on/15 sec off 20#/5# x 15 min   12/01/23 UBE L2 x 6 min 3 fwd/bwd - PT present to discuss status Doorway stretch at 90/90 2x 30 sec Nerve glides - ulnar and radial multiple variations tested - prayer best and added to HEP Manual: Trigger Point Dry  Needling  Subsequent Treatment: Instructions provided previously at initial dry needling treatment.   Patient Verbal Consent Given: Yes Education Handout  Provided: Previously Provided Muscles Treated: L ECRB, brachioradialis, triceps and UT Electrical Stimulation Performed: No Treatment Response/Outcome: Utilized skilled palpation to identify bony landmarks and trigger points.  Able to illicit twitch response and muscle elongation.  Soft tissue mobilization to forearm and triceps to further promote tissue elongation and decreased pain.      Mechanical Cervical traction 45 sec on/15 sec off 20#/5# x 15 min   11/25/23  Discussion of putty and UE ex's with a weight for upper quarter strengthening Manual therapy: left > right cervical musculature Trigger Point Dry Needling Initial Treatment: Pt instructed on Dry Needling rational, procedures, and possible side effects. Pt instructed to expect mild to moderate muscle soreness later in the day and/or into the next day.  Pt instructed in methods to reduce muscle soreness. Pt instructed to continue prescribed HEP. Patient verbalized understanding of these instructions and education.  Patient Verbal Consent Given: Yes Education Handout Provided: Yes Muscles Treated: bil cervical multifidi; left upper trap; left levator scap Electrical Stimulation Performed: No Treatment Response/Outcome: improved soft tissue mobility Discussed after care including heat for soreness Mechanical Cervical traction 45 sec on/15 sec off 18#/5# x 12 min                                                                                                                            11/22/23 See pt ed and HEP  Mechanical Cervical traction 60 sec on/20 sec off 18#/5# x 12 min Putty - green putty for grip strength  PATIENT EDUCATION:  Education details: PT eval findings, anticipated POC, initial HEP, and putty issued green med  Person educated: Patient Education method:  Explanation, Demonstration, and Handouts Education comprehension: verbalized understanding and returned demonstration  HOME EXERCISE PROGRAM: Putty, weight with wrist, forearm and elbow movements Access Code: [email or web address] URL: [email or web address] Date: 12/13/2023 Prepared by: Concha Deed  Exercises - Ulnar Nerve Flossing  - 2 x daily - 7 x weekly - 2 sets - 10 reps - Doorway Pec Stretch at 90 Degrees Abduction  - 1 x daily - 7 x weekly - 1 sets - 3 reps - 20-30 sec hold - Seated Cervical Retraction  - 2 x daily - 7 x weekly - 1 sets - 10 reps - 5 hold - Standing Radial Nerve Glide  - 2 x daily - 7 x weekly - 1 sets - 10 reps - 5 sec on/5 sec off hold - Shoulder External Rotation and Scapular Retraction  - 1 x daily - 7 x weekly - 2 sets - 10 reps  ASSESSMENT:  CLINICAL IMPRESSION: Patient presents with ongoing symptoms. Patient has been out of town since last visit. He has some better days, but some worse days as well. No significant relief with DN. He does report improved mobility after traction, so we continued with this today. He has tightness in B pectoralis muscles. He experienced increased nerve tension with radial nerve glide (added to HEP). Postural exercises also added to HEP.  Blue putty also issued to progress grip strength. Patient continues to demonstrate potential for improvement and would benefit from continued skilled therapy to address impairments.     Eval:  Patient is a 58 y.o. male who was seen today for physical therapy evaluation and treatment for left cervcical radiculopathy. He has a h/o of neck issue for over 5 years. He also has been experiencing a flare up of Myasthenia Gravis affecting his vision. Patient reports pain in the neck and left UE primarily in the forearm and hand. He has numbness here as well. He has positive ULTT, ulnar > radial. 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 He will benefit from skilled PT to address these deficits.  Trial of manual traction provided some relief during assessment, so trial of mechanical traction was done as well. No immediate relief noted.   OBJECTIVE IMPAIRMENTS: decreased activity tolerance, decreased strength, increased fascial restrictions, increased muscle spasms, impaired flexibility, impaired sensation, impaired UE functional use, postural dysfunction, and pain.   ACTIVITY LIMITATIONS: carrying, lifting, sleeping, bathing, dressing, and hygiene/grooming  PARTICIPATION LIMITATIONS: meal prep, cleaning, laundry, driving, shopping, community activity, occupation, and yard work  PERSONAL FACTORS: Past/current experiences, Time since onset of injury/illness/exacerbation, and 1-2 comorbidities: DM and MG are also affecting patient's functional outcome.   REHAB POTENTIAL: Good  CLINICAL DECISION MAKING: Evolving/moderate complexity  EVALUATION COMPLEXITY: Moderate   GOALS: Goals reviewed with patient? Yes  SHORT TERM GOALS: Target date: 12/13/2023   Patient will be independent with initial HEP.  Baseline:  Goal status: MET 2.  Patient to report improved sensation in his L UE by 30% to aid in lifting and carrying. Baseline:  Goal status: IN PROGRESS   3.  Decreased pain in neck and UE by 30% or more to improve sleep. Baseline:  Goal status: IN PROGRESS  4.  Pt to demo negative ULTT showing improved neural mobility Baseline:  Goal status: IN PROGRESS    LONG TERM GOALS: Target date: 01/17/2024   Patient will be independent with advanced/ongoing HEP to improve outcomes and carryover.  Baseline:  Goal status: INITIAL  2.  Patient will report 75% improvement in neck pain with ADLS.  Baseline:  Goal status: INITIAL  3.  report improved sensation in his L UE by 75% to aid in lifting and carrying. Baseline:  Goal status:INITIAL  4.  Patient will demonstrate improved left grip strength to 50# or  better to normalize function with lifting. Baseline:  Goal status:INITIAL  5.  Patient will report 9 or less on NDI to demonstrate improved functional ability.  Baseline: 19 Goal status: INITIAL  6.  Patient will demonstrate improved UE strength to 5/5 to normalize ADLS.   Baseline:  Goal status: INITIAL     PLAN:  PT FREQUENCY: 2x/week  PT DURATION: 8 weeks  PLANNED INTERVENTIONS: 97164- PT Re-evaluation, 97110-Therapeutic exercises, 97530- Therapeutic activity, 97112- Neuromuscular re-education, 97535- Self Care, [id number]- Manual therapy, [id number]- Electrical stimulation (unattended), Patient/Family education, Taping, Dry Needling, Joint mobilization, Spinal mobilization, Cryotherapy, and Moist heat  PLAN FOR NEXT SESSION:  Assess response to traction and radial nerve glide, continue with chest opening, postural, UE and grip strengthening, neck stabilization, manual/spinal mobs prn  Jinx Mourning, PT 12/13/23 12:43 PM Phone: [phone] Fax: [phone]

## 2023-12-13 ENCOUNTER — Ambulatory Visit: Attending: Neurology | Admitting: Physical Therapy

## 2023-12-13 ENCOUNTER — Other Ambulatory Visit (HOSPITAL_COMMUNITY): Payer: Self-pay

## 2023-12-13 ENCOUNTER — Other Ambulatory Visit: Payer: Self-pay

## 2023-12-13 ENCOUNTER — Encounter: Payer: Self-pay | Admitting: Physical Therapy

## 2023-12-13 DIAGNOSIS — M5412 Radiculopathy, cervical region: Secondary | ICD-10-CM | POA: Diagnosis not present

## 2023-12-13 DIAGNOSIS — M6281 Muscle weakness (generalized): Secondary | ICD-10-CM | POA: Insufficient documentation

## 2023-12-13 DIAGNOSIS — R252 Cramp and spasm: Secondary | ICD-10-CM | POA: Diagnosis not present

## 2023-12-13 MED ORDER — AMLODIPINE BESY-BENAZEPRIL HCL 10-40 MG PO CAPS
1.0000 | ORAL_CAPSULE | Freq: Every day | ORAL | 1 refills | Status: DC
  Filled 2023-12-13: qty 90, 90d supply, fill #0
  Filled 2023-12-27 – 2024-03-14 (×2): qty 90, 90d supply, fill #1

## 2023-12-14 ENCOUNTER — Ambulatory Visit (INDEPENDENT_AMBULATORY_CARE_PROVIDER_SITE_OTHER)

## 2023-12-14 VITALS — BP 130/82 | HR 92 | Temp 98.3°F | Resp 14 | Ht 71.0 in | Wt 188.8 lb

## 2023-12-14 DIAGNOSIS — G7000 Myasthenia gravis without (acute) exacerbation: Secondary | ICD-10-CM | POA: Diagnosis not present

## 2023-12-14 MED ORDER — EFGARTIGIMOD ALFA-HYALUR-QVFC 180-2000 MG-UNIT/ML ~~LOC~~ SOLN
1008.0000 mg | Freq: Once | SUBCUTANEOUS | Status: AC
  Administered 2023-12-14: 1008 mg via SUBCUTANEOUS

## 2023-12-14 NOTE — Progress Notes (Signed)
 Diagnosis: Myasthenia Gravis  Provider:  Mannam, Praveen MD  Procedure: Injection  EFGARTIGIMOD ALFA  & HYALURONIDASE -QVFC (VYVGART  HYTRULO) , Dose: 1008 mg, Site: subcutaneous, Number of injections: 1  Injection Site(s): Right lower quad. abdomne  Post Care: Observation period completed  Discharge: Condition: Stable, Destination: Home . AVS Declined  Performed by:  Lendel Quant, RN

## 2023-12-15 ENCOUNTER — Ambulatory Visit (INDEPENDENT_AMBULATORY_CARE_PROVIDER_SITE_OTHER): Admitting: Neurology

## 2023-12-15 ENCOUNTER — Encounter: Payer: Self-pay | Admitting: Physical Therapy

## 2023-12-15 ENCOUNTER — Ambulatory Visit: Admitting: Physical Therapy

## 2023-12-15 VITALS — BP 136/83 | HR 91 | Ht 71.0 in | Wt 189.0 lb

## 2023-12-15 DIAGNOSIS — R292 Abnormal reflex: Secondary | ICD-10-CM | POA: Diagnosis not present

## 2023-12-15 DIAGNOSIS — M6281 Muscle weakness (generalized): Secondary | ICD-10-CM | POA: Diagnosis not present

## 2023-12-15 DIAGNOSIS — R29898 Other symptoms and signs involving the musculoskeletal system: Secondary | ICD-10-CM | POA: Diagnosis not present

## 2023-12-15 DIAGNOSIS — G7001 Myasthenia gravis with (acute) exacerbation: Secondary | ICD-10-CM | POA: Diagnosis not present

## 2023-12-15 DIAGNOSIS — M5412 Radiculopathy, cervical region: Secondary | ICD-10-CM

## 2023-12-15 DIAGNOSIS — R252 Cramp and spasm: Secondary | ICD-10-CM | POA: Diagnosis not present

## 2023-12-15 NOTE — Therapy (Signed)
 OUTPATIENT PHYSICAL THERAPY CERVICAL TREATMENT NOTE   Patient Name: Mark Black MRN: [id number] DOB:11-06-1965, 58 y.o., male Today's Date: 12/15/2023  END OF SESSION:  PT End of Session - 12/15/23 1235     Visit Number 5    Date for PT Re-Evaluation 01/17/24    Authorization Type [place] [employer]ET[employer]    PT Start Time 1233    PT Stop Time 1316    PT Time Calculation (min) 43 min    Activity Tolerance Patient tolerated treatment well    Behavior During Therapy [facility] for tasks assessed/performed                Past Medical History:  Diagnosis Date   Allergic rhinitis 12/24/2017   Allergy    Alopecia totalis    Anxiety 12/24/2017   Asthma    as a child   Chicken pox    Diabetes mellitus without complication (HCC)    Elevated liver enzymes 10/13/2020   Essential hypertension 02/10/2013   Eyelid abnormality 04/23/2021   GERD (gastroesophageal reflux disease) 01/27/2019   Hypogonadism in male 06/26/2019   Hypothyroidism 02/10/2013   Lower urinary tract symptoms (LUTS) 12/24/2017   Screening for colon cancer 12/24/2017   Suspected COVID-19 virus infection 06/20/2020   T2DM (type 2 diabetes mellitus) (HCC) 03/21/2018   Vitiligo    Past Surgical History:  Procedure Laterality Date   CARPAL TUNNEL RELEASE     Bil   NASAL SINUS SURGERY  1994   cut and open air passages/nostrils   Patient Active Problem List   Diagnosis Date Noted   Erectile dysfunction 08/09/2023   Hemorrhoid 01/27/2023   Myasthenia gravis without acute exacerbation (HCC) 05/01/2022   Hypertriglyceridemia 10/24/2021   Allergy 10/23/2021   Alopecia totalis 10/23/2021   Asthma 10/23/2021   Chicken pox 10/23/2021   Diabetes mellitus without complication (HCC) 10/23/2021   Vitiligo 10/23/2021   Eyelid abnormality 04/23/2021   Elevated liver enzymes 10/13/2020   Suspected COVID-19 virus infection 06/20/2020   Hypogonadism in male 06/26/2019   GERD (gastroesophageal reflux disease) 01/27/2019   T2DM (type 2  diabetes mellitus) (HCC) 03/21/2018   Anxiety 12/24/2017   Allergic rhinitis 12/24/2017   Lower urinary tract symptoms (LUTS) 12/24/2017   Screening for colon cancer 12/24/2017   Essential hypertension 02/10/2013   Hypothyroidism 02/10/2013    PCP: Adela Holter, DO   REFERRING PROVIDER: Daryel Ensign, DO   REFERRING DIAG:  [phone] (ICD-10-CM) - Cervical radiculopathy  G70.01 (ICD-10-CM) - Myasthenia gravis with exacerbation (HCC)    THERAPY DIAG:  Radiculopathy, cervical region  Muscle weakness (generalized)  Cramp and spasm  Rationale for Evaluation and Treatment: Rehabilitation  ONSET DATE: 5 months ago  SUBJECTIVE:  SUBJECTIVE STATEMENT:  The traction still loosens me up, but nothing is affecting my strength. Haven't had time to try blue putty yet. Had inc   Started having weakness with left grip. It stays tense in the neck. He has had spasms in the left forearm as well. Forearm and hand are numb. Due to flare up of MG, he delayed seeking treatment for this. Also reports R shoulder pain.  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 Yes: NPRS scale: 0/10 Pain location: B neck left worse Pain description: constant ache Aggravating factors: nothing that he has noticed Relieving factors: rest  PRECAUTIONS: None  RED FLAGS: None     WEIGHT BEARING RESTRICTIONS: No  FALLS:  Has patient fallen in last 6 months? No  LIVING ENVIRONMENT: Lives with: lives with their family  OCCUPATION: worked at [employer] but was layed off in [employer]  PLOF: Independent  PATIENT GOALS: get my strength back in my left arm  NEXT MD VISIT: 4-6 weeks  OBJECTIVE:   Note: Objective measures were completed at Evaluation unless otherwise noted.  DIAGNOSTIC FINDINGS:   NCV test some right RTC and pinched nerve in neck  PATIENT SURVEYS:  NDI 19 / 50 = 38.0 %  COGNITION: Overall cognitive status: Within functional limits for tasks assessed  SENSATION: Not tested  POSTURE: rounded shoulders  PALPATION: Left SO, brachioradialis   CERVICAL ROM: * pain  Active ROM A/PROM (deg) eval  Flexion full  Extension full  Right lateral flexion 28  Left lateral flexion 28*  Right rotation 50  Left rotation 40   (Blank rows = not tested)  UPPER EXTREMITY ROM: WNL   UPPER EXTREMITY MMT:  MMT Right eval Left eval  Shoulder flexion  4+  Shoulder extension    Shoulder abduction  4+  Shoulder adduction    Shoulder extension  5  Shoulder internal rotation    Shoulder external rotation    Middle trapezius    Lower trapezius    Elbow flexion  4+  Elbow extension  5  Wrist flexion  5  Wrist extension  3+  Wrist ulnar deviation    Wrist radial deviation    Wrist pronation  4  Wrist supination  4  Grip strength 80 30   (Blank rows = not tested)  CERVICAL SPECIAL TESTS:  Upper limb tension test (ULTT): Positive, Spurling's test: Negative, and Distraction test: Positive  ulnar and radial   TREATMENT DATE:     12/15/23 UBE L2 x 4 min 2 fwd/bwd - PT present to discuss status Doorway stretch at 60 deg, 120 deg,  2x 30 sec ea Seated chin tucks 5 sec hold into purple ball 2 x 10 Seated lateral flexion and rotation 5 sec hold x 5 B Radial Nerve glides x 10 5 sec hold (see HEP) Prone on elbows cervical diagonals x 10 B elbow to over opp shoulder - no change in crunching afterwards  Mechanical Cervical static traction 23# x 10 min  12/13/23 UBE L2 x 6 min 3 fwd/bwd - PT present to discuss status Doorway stretch at 60 deg, 120 deg,  2x 30 sec ea Seated chin tucks 5 sec hold x 10, then into purple ball x 10 Standing scapular retraction x 10 on  noodle --> L x 10, W x 10 Radial Nerve glides x 10 5 sec hold (see HEP)  Blue Putty issued for HEP   Mechanical Cervical traction 45 sec on/15 sec off 20#/5# x 15 min  12/01/23 UBE L2 x 6 min 3 fwd/bwd - PT present to discuss status Doorway stretch at 90/90 2x 30 sec Nerve glides - ulnar and radial multiple variations tested - prayer best and added to HEP Manual: Trigger Point Dry Needling  Subsequent Treatment: Instructions provided previously at initial dry needling treatment.   Patient Verbal Consent Given: Yes Education Handout Provided: Previously Provided Muscles Treated: L ECRB, brachioradialis, triceps and UT Electrical Stimulation Performed: No Treatment Response/Outcome: Utilized skilled palpation to identify bony landmarks and trigger points.  Able to illicit twitch response and muscle elongation.  Soft tissue mobilization to forearm and triceps to further promote tissue elongation and decreased pain.      Mechanical Cervical traction 45 sec on/15 sec off 20#/5# x 15 min   11/25/23  Discussion of putty and UE ex's with a weight for upper quarter strengthening Manual therapy: left > right cervical musculature Trigger Point Dry Needling Initial Treatment: Pt instructed on Dry Needling rational, procedures, and possible side effects. Pt instructed to expect mild to moderate muscle soreness later in the day and/or into the next day.  Pt instructed in methods to reduce muscle soreness. Pt instructed to continue prescribed HEP. Patient verbalized understanding of these instructions and education.  Patient Verbal Consent Given: Yes Education Handout Provided: Yes Muscles Treated: bil cervical multifidi; left upper trap; left levator scap Electrical Stimulation Performed: No Treatment Response/Outcome: improved soft tissue mobility Discussed after care including heat for soreness Mechanical Cervical traction 45 sec on/15 sec off 18#/5# x 12 min                                                                                                                             11/22/23 See pt ed and HEP  Mechanical Cervical traction 60 sec on/20 sec off 18#/5# x 12 min Putty - green putty for grip strength  PATIENT EDUCATION:  Education details: PT eval findings, anticipated POC, initial HEP, and putty issued green med  Person educated: Patient Education method: Explanation, Demonstration, and Handouts Education comprehension: verbalized understanding and returned demonstration  HOME EXERCISE PROGRAM: Putty, weight with wrist, forearm and elbow movements Access Code: [email or web address] URL: [email or web address] Date: 12/15/2023 Prepared by: Concha Deed  Exercises - Ulnar Nerve Flossing  - 2 x daily - 7 x weekly - 2 sets - 10 reps - Doorway Pec Stretch at 90 Degrees Abduction  - 1 x daily - 7 x weekly - 1 sets - 3 reps - 20-30 sec hold - Seated Cervical Retraction  - 2 x daily - 7 x weekly - 1 sets - 10 reps - 5 hold - Standing Radial Nerve Glide  - 2 x daily - 7 x weekly - 1 sets - 10 reps - 5 sec on/5 sec off hold - Shoulder External Rotation and Scapular Retraction  - 1 x daily -  7 x weekly - 2 sets - 10 reps - Seated Cervical Rotation AROM  - 1 x daily - 7 x weekly - 1 sets - 5-10 reps - 5 hold - Seated Cervical Sidebending AROM  - 1 x daily - 7 x weekly - 1 sets - 5-10 reps - 5 hold - Seated Cervical Extension AROM  - 1 x daily - 7 x weekly - 1 sets - 5-10 reps - 5 hold  ASSESSMENT:  CLINICAL IMPRESSION: Patient presents with ongoing symptoms. He continues to report improved mobility after traction, but it lasts mainly that day. Trial of static mechanical traction done today to see if this will help with extending mobility. He reports no crunching in neck post traction. Continued nerve tension with radial nerve glide today and he also felt some ulnar nerve tension. Cervical ROM exercises added to HEP to address tightness.    Eval:  Patient is a 58 y.o. male who  was seen today for physical therapy evaluation and treatment for left cervcical radiculopathy. He has a h/o of neck issue for over 5 years. He also has been experiencing a flare up of Myasthenia Gravis affecting his vision. Patient reports pain in the neck and left UE primarily in the forearm and hand. He has numbness here as well. He has positive ULTT, ulnar > radial. He has weakness in his L UE and grip and muscles spasms in the forearm affecting ADLs including his ability to work in [employer]. He will benefit from skilled PT to address these deficits.  Trial of manual traction provided some relief during assessment, so trial of mechanical traction was done as well. No immediate relief noted.   OBJECTIVE IMPAIRMENTS: decreased activity tolerance, decreased strength, increased fascial restrictions, increased muscle spasms, impaired flexibility, impaired sensation, impaired UE functional use, postural dysfunction, and pain.   ACTIVITY LIMITATIONS: carrying, lifting, sleeping, bathing, dressing, and hygiene/grooming  PARTICIPATION LIMITATIONS: meal prep, cleaning, laundry, driving, shopping, community activity, occupation, and yard work  PERSONAL FACTORS: Past/current experiences, Time since onset of injury/illness/exacerbation, and 1-2 comorbidities: DM and MG are also affecting patient's functional outcome.   REHAB POTENTIAL: Good  CLINICAL DECISION MAKING: Evolving/moderate complexity  EVALUATION COMPLEXITY: Moderate   GOALS: Goals reviewed with patient? Yes  SHORT TERM GOALS: Target date: 12/13/2023   Patient will be independent with initial HEP.  Baseline:  Goal status: MET 2.  Patient to report improved sensation in his L UE by 30% to aid in lifting and carrying. Baseline:  Goal status: IN PROGRESS   3.  Decreased pain in neck and UE by 30% or more to improve sleep. Baseline:  Goal status: IN PROGRESS  4.  Pt to demo negative ULTT showing improved neural mobility Baseline:   Goal status: IN PROGRESS    LONG TERM GOALS: Target date: 01/17/2024   Patient will be independent with advanced/ongoing HEP to improve outcomes and carryover.  Baseline:  Goal status: INITIAL  2.  Patient will report 75% improvement in neck pain with ADLS.  Baseline:  Goal status: INITIAL  3.  report improved sensation in his L UE by 75% to aid in lifting and carrying. Baseline:  Goal status:INITIAL  4.  Patient will demonstrate improved left grip strength to 50# or better to normalize function with lifting. Baseline:  Goal status:INITIAL  5.  Patient will report 9 or less on NDI to demonstrate improved functional ability.  Baseline: 19 Goal status: INITIAL  6.  Patient will demonstrate improved UE strength to 5/5  to normalize ADLS.   Baseline:  Goal status: INITIAL     PLAN:  PT FREQUENCY: 2x/week  PT DURATION: 8 weeks  PLANNED INTERVENTIONS: 97164- PT Re-evaluation, 97110-Therapeutic exercises, 97530- Therapeutic activity, 97112- Neuromuscular re-education, 97535- Self Care, [id number]- Manual therapy, [id number]- Electrical stimulation (unattended), Patient/Family education, Taping, Dry Needling, Joint mobilization, Spinal mobilization, Cryotherapy, and Moist heat  PLAN FOR NEXT SESSION:  Assess response to static traction and radial nerve glide, continue with chest opening, progress UE and grip strengthening, neck stabilization, manual/spinal mobs prn  Jinx Mourning, PT 12/15/23 1:28 PM Phone: [phone] Fax: [phone]

## 2023-12-15 NOTE — Progress Notes (Signed)
 Follow-up Visit   Date: 12/15/23   Mark Black MRN: [id number] DOB: [birth year]   Interim History: Mark Black is a 58 y.o. 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  The patient was accompanied to the clinic by wife.   IMPRESSION/PLAN: Seropositive myasthenia gravis with exacerbation, thymoma negative (2023).  He continues to have severe ptosis and diplopia almost causing functional blindness. He was initially on prednisone  30mg  did not respond, so Vyvgart  Hytrulo was started in May 2024 and responded.   During the fall 2024, prednisone  was tapered down to 10mg  daily and he began to have an exacerbation.  Symptoms did not improve with higher dose of prednisone , so we attempted to restart Vyvgart  Hytrulo, however, insurance denied this.   January 2025, he received IVIG which provided mild improvement.  He continues to have moderate bulbar symptoms.   He completed second series of Vyvgart  Hytrulo in April 2025, but did not have marked benefit.  He started 3rd series of Vyvgart  on 6/3.  MG history:  - 08/2021 presented with ptosis and diplopia, symptoms initially controlled on mestinon  prn  - July 2023 worsening ocular symptoms.  Started prednisone .  - Sept 2023 prednisone  increased to 25mg /d which improved ptosis, mestinon  stopped due to GI side effects  - Jan 2024, started prednisone  taper  - April 2024, worsening generalized symptoms  - May - June 2024, Vyvgart  Hytrulo  - Jan 2025 - IVIG, mild improvement  2.  Cervical radiculopathy manifesting with left hand numbness, but now with weakness.  NCS/EMG shows bilateral C7 radiculopathy.  No improvement with PT, so will get MRI cervical spine.  PLAN/RECOMMENDATIONS:  Continue Vyvagrt Hytrulo injections.  He started 3rd cycle of infusions yesterday.   MGFA 8.  If he does not respond to this cycle of Vygart Hytrulo, alternative options such as Rystiggo or Ultomiris will need to be considered.  Continue prednisone  to 20mg  daily.   MRI cervical spine wo contrast Continue neck PT  Return to clinic in 6 weeks  ------------------------------------------- History of present illness: Starting around the fall of 2022, he began having intermittent droopiness of the left eyelid.  It can occur several days of the week and when present, it will be constant all day.  Rest does not seem to make any improvement, as he can wake up with it.  No double vision, difficulty swallowing/talking, jaw fatigue, or limb weakness.  He was seen by his ophthalmologist who referred him for evaluation of myasthenia gravis.    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    UPDATE 11/25/2022:  He was last seen in March at which time MG was very well controlled on prednisone  10mg  daily.  Unfortunately, over the past month, he reports generalized weakness, worsening double vision and droopiness of the eyelids.  His eyelids get very droopy, especially on the left where it can remain completely closed.  His right eyelid is not as severe. Double vision improved with closing one eye.   He has jaw fatigue and intermittent difficulty with swallowing.  No shortness of breath or slurred speech.  He increased prednisone  to 20mg  daily but did not appreciate any benefit.  His HbA1c is 10 and is his currently not monitoring blood glucose.  He was recently started on Ozepmic.  No recent illnesses.   UPDATE 06/15/2023:  He was last seen in May and missed prior follow-up visit.  He reports after starting Vyvgart  Hytrulo his symptoms significantly improved.  He was on prednisone  15mg  and when he tapered down to 10mg  every other day, his symptoms returned.  Since October, he has been on  prednisone  20mg  daily without any benefit.  He continues to have severe droopiness and double vision.  He has some weakness in the arms and legs.  No difficulty with swallowing/speech. He is not taking mestinon  due to GI side effects.   UPDATE 08/25/2023:  He is here for follow-up visit.  He received IVIG in January and started to see some improvement about two week later, but symptoms did not completely get better.  He continues to have droopiness of the eyelids daily, which does not completely close, but will get to where it covers half of his eye at times.  He is able to drive again.  He has not noticed any benefit on prednisone  20mg  and reports diabetes is getting worse with last HbA1c 8.8.  They would like to consider lowering the dose.   For the past two months, he also complains of left hand weakness and numbness.  He has dropped things occasionally.    UPDATE 09/29/2023:  He is here for follow-up visit.  He continues to have droopiness of the eyelids, which fluctuate.  He had double vision a few weeks ago.  He had IVIG on 3/5 and reports that eyelids were not as droopy, but this only lasted a few days and then went back to being droopy all the time.  He is on prednisone  10mg  daily.    UPDATE 11/10/2023:  He is here for follow-up visit.  He completed series of Vyvgart  Hytrulo on 4/16, and reports a few days of improved symptoms, but this was not lasting.  He continues to have constant double vision and ptosis.  He is also getting much more tired and unable to be active as previously.  He has not been to the gym in a month.  Arms and legs feel tired.  He has noticed skin can bruise easily.     UPDATE 12/15/2023:  He is here for follow-up visit.  Unfortunately, there has not been any significant change since his last visit.  He continues to have constant double vision and ptosis, worse on the right eye.  He is also having worsening left hand weakness and unable to hold objects or spread his fingers apart.   He has started PT, but not noticed a marked improvement.   Medications:  Current Outpatient Medications on File Prior to Visit  Medication Sig Dispense Refill   amLODipine -benazepril  (LOTREL ) 10-40 MG capsule TAKE 1 CAPSULE BY MOUTH ONCE DAILY 90 capsule 1   buPROPion  (WELLBUTRIN  XL) 150 MG 24 hr tablet Take 1 tablet (150 mg total) by mouth daily. 90 tablet 1   calcium  carbonate (OS-CAL) 600 MG TABS tablet Take 1 tablet (600 mg total) by mouth 2 (two) times daily with a meal. 60 tablet 5   Continuous Glucose Sensor (FREESTYLE LIBRE 3 SENSOR) MISC Please apply for 14 days and then switch to new sensor 2 each 3   dapagliflozin  propanediol (FARXIGA ) 5 MG TABS tablet Take 1 tablet (5 mg total) by mouth daily. 90 tablet 1   desvenlafaxine  (PRISTIQ ) 100 MG 24 hr tablet Take 1 tablet (100 mg total) by mouth daily. 90 tablet 1   levothyroxine  (SYNTHROID ) 175 MCG tablet  Take 1 tablet (175 mcg total) by mouth daily before breakfast. 90 tablet 0   metFORMIN  (GLUCOPHAGE -XR) 500 MG 24 hr tablet Take 3 tablets (1,500 mg total) by mouth daily with breakfast. 270 tablet 1   Multiple Vitamins-Minerals (MULTIVITAMIN ADULT PO) Take 1 tablet by mouth daily.     pantoprazole  (PROTONIX ) 40 MG tablet Take 1 tablet (40 mg total) by mouth daily. 90 tablet 2   predniSONE  (DELTASONE ) 10 MG tablet Take 2 tablets (20 mg total) by mouth daily. 120 tablet 1   tadalafil  (CIALIS ) 10 MG tablet Take 1-2 tablets (10-20 mg total) by mouth every other day as needed for erectile dysfunction. 15 tablet 1   tamsulosin  (FLOMAX ) 0.4 MG CAPS capsule Take 1 capsule (0.4 mg total) by mouth daily. 90 capsule 3   tirzepatide  (MOUNJARO ) 7.5 MG/0.5ML Pen Inject 7.5 mg into the skin once a week. 6 mL 0   Current Facility-Administered Medications on File Prior to Visit  Medication Dose Route Frequency Provider Last Rate Last Admin   0.9 %  sodium chloride  infusion  500 mL Intravenous Once Armbruster, Lendon Queen, MD        Allergies: No Known  Allergies  Vital Signs:  BP 136/83   Pulse 91   Ht 5\' 11"  (1.803 m)   Wt 189 lb (85.7 kg)   SpO2 98%   BMI 26.36 kg/m    Neurological Exam: MENTAL STATUS including orientation to time, place, person, recent and remote memory, attention span and concentration, language, and fund of knowledge is normal.  Speech is not dysarthric.  CRANIAL NERVES:   Left eye is blind.  Extraocular muscles are intact.  Moderate-to-severe right ptosis, mild left ptosis, significantly worse with sustained upgaze.  Face is symmetric. Palate elevates symmetrically.  Orbicularis oculi and buccinator is 4/5.  MOTOR:  No atrophy, fasciculations or abnormal movements.  No pronator drift.   Upper Extremity:  Right  Left  Deltoid  5/5   5/5   Biceps  5/5   5/5   Triceps  5/5   5/5   Wrist extensors  5/5   4/5   Wrist flexors  5/5   5/5   Finger extensors  5/5   4/5   Finger flexors  5/5   5/5   Dorsal interossei  5/5   4/5   Abductor pollicis  5/5   5/5   Tone (Ashworth scale)  0  0   Lower Extremity:  Right  Left  Hip flexors  5/5   5/5   Dorsiflexors  5/5   5/5   Tone (Ashworth scale)  0  0     MSRs:                                           Right        Left brachioradialis 2+  2+  biceps 2+  2+  triceps 2+  3+  patellar 3+  3+  ankle jerk 2+  2+   SENSATION:  Reduced temperature on the left hand, vibration intact throughout.   COORDINATION/GAIT:   Gait narrow based and stable.   Data: Myasthenia gravis panel 08/13/2021:  AChR blocking 40*, modulating 79*  Lab Results  Component Value Date   HGBA1C 5.6 11/25/2023   CT chest wwo contrast 09/12/2021:  Negative for thymoma  NCS/EMG of the right arm 09/30/2023: Chronic C7 radiculopathy  affecting bilateral upper extremities, mild. Right median neuropathy at or distal to the wrist, consistent with a clinical diagnosis of carpal tunnel syndrome.  Overall, these findings are very mild in degree electrically.   Thank you for allowing me to  participate in patient's care.  If I can answer any additional questions, I would be pleased to do so.    Sincerely,    Corley Maffeo K. Lydia Sams, DO

## 2023-12-15 NOTE — Patient Instructions (Signed)
 MRI cervical spine without contrast  Continue Vyvgart  infusions  Continue prednisone  20mg  daily

## 2023-12-19 NOTE — Therapy (Signed)
 OUTPATIENT PHYSICAL THERAPY CERVICAL TREATMENT NOTE   Patient Name: Mark Black MRN: [id number] DOB:1966-03-07, 58 y.o., male Today's Date: 12/20/2023  END OF SESSION:  PT End of Session - 12/20/23 1020     Visit Number 6    Date for PT Re-Evaluation 01/17/24    Authorization Type [place] [employer]    PT Start Time 1020    PT Stop Time 1100    PT Time Calculation (min) 40 min    Activity Tolerance Patient tolerated treatment well    Behavior During Therapy [facility] for tasks assessed/performed                 Past Medical History:  Diagnosis Date   Allergic rhinitis 12/24/2017   Allergy    Alopecia totalis    Anxiety 12/24/2017   Asthma    as a child   Chicken pox    Diabetes mellitus without complication (HCC)    Elevated liver enzymes 10/13/2020   Essential hypertension 02/10/2013   Eyelid abnormality 04/23/2021   GERD (gastroesophageal reflux disease) 01/27/2019   Hypogonadism in male 06/26/2019   Hypothyroidism 02/10/2013   Lower urinary tract symptoms (LUTS) 12/24/2017   Screening for colon cancer 12/24/2017   Suspected COVID-19 virus infection 06/20/2020   T2DM (type 2 diabetes mellitus) (HCC) 03/21/2018   Vitiligo    Past Surgical History:  Procedure Laterality Date   CARPAL TUNNEL RELEASE     Bil   NASAL SINUS SURGERY  1994   cut and open air passages/nostrils   Patient Active Problem List   Diagnosis Date Noted   Erectile dysfunction 08/09/2023   Hemorrhoid 01/27/2023   Myasthenia gravis without acute exacerbation (HCC) 05/01/2022   Hypertriglyceridemia 10/24/2021   Allergy 10/23/2021   Alopecia totalis 10/23/2021   Asthma 10/23/2021   Chicken pox 10/23/2021   Diabetes mellitus without complication (HCC) 10/23/2021   Vitiligo 10/23/2021   Eyelid abnormality 04/23/2021   Elevated liver enzymes 10/13/2020   Suspected COVID-19 virus infection 06/20/2020   Hypogonadism in male 06/26/2019   GERD (gastroesophageal reflux disease) 01/27/2019   T2DM (type 2  diabetes mellitus) (HCC) 03/21/2018   Anxiety 12/24/2017   Allergic rhinitis 12/24/2017   Lower urinary tract symptoms (LUTS) 12/24/2017   Screening for colon cancer 12/24/2017   Essential hypertension 02/10/2013   Hypothyroidism 02/10/2013    PCP: Adela Holter, DO   REFERRING PROVIDER: Daryel Ensign, DO   REFERRING DIAG:  [phone] (ICD-10-CM) - Cervical radiculopathy  G70.01 (ICD-10-CM) - Myasthenia gravis with exacerbation (HCC)    THERAPY DIAG:  Radiculopathy, cervical region  Cramp and spasm  Muscle weakness (generalized)  Rationale for Evaluation and Treatment: Rehabilitation  ONSET DATE: 5 months ago  SUBJECTIVE:  SUBJECTIVE STATEMENT:  No change in arm. The traction helps my neck mobility.   Started having weakness with left grip. It stays tense in the neck. He has had spasms in the left forearm as well. Forearm and hand are numb. Due to flare up of MG, he delayed seeking treatment for this. Also reports R shoulder pain.  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 Yes: NPRS scale: 0/10 Pain location: B neck left worse Pain description: constant ache Aggravating factors: nothing that he has noticed Relieving factors: rest  PRECAUTIONS: None  RED FLAGS: None     WEIGHT BEARING RESTRICTIONS: No  FALLS:  Has patient fallen in last 6 months? No  LIVING ENVIRONMENT: Lives with: lives with their family  OCCUPATION: worked at [employer] but was layed off in [employer]  PLOF: Independent  PATIENT GOALS: get my strength back in my left arm  NEXT MD VISIT: 4-6 weeks  OBJECTIVE:  Note: Objective measures were completed at Evaluation unless  otherwise noted.  DIAGNOSTIC FINDINGS:   NCV test some right RTC and pinched nerve in neck  PATIENT SURVEYS:  NDI 19 / 50 = 38.0 %  COGNITION: Overall cognitive status: Within functional limits for tasks assessed  SENSATION: Not tested  POSTURE: rounded shoulders  PALPATION: Left SO, brachioradialis   CERVICAL ROM: * pain  Active ROM A/PROM (deg) eval  Flexion full  Extension full  Right lateral flexion 28  Left lateral flexion 28*  Right rotation 50  Left rotation 40   (Blank rows = not tested)  UPPER EXTREMITY ROM: WNL   UPPER EXTREMITY MMT:  MMT Right eval Left eval  Shoulder flexion  4+  Shoulder extension    Shoulder abduction  4+  Shoulder adduction    Shoulder extension  5  Shoulder internal rotation    Shoulder external rotation    Middle trapezius    Lower trapezius    Elbow flexion  4+  Elbow extension  5  Wrist flexion  5  Wrist extension  3+  Wrist ulnar deviation    Wrist radial deviation    Wrist pronation  4  Wrist supination  4  Grip strength 80 30   (Blank rows = not tested)  CERVICAL SPECIAL TESTS:  Upper limb tension test (ULTT): Positive, Spurling's test: Negative, and Distraction test: Positive  ulnar and radial   TREATMENT DATE:      12/20/23 UBE L2 x 4 min 2 fwd/bwd - PT present to discuss status Seated wrist ext gravity eliminated x 10, then with 5 sec hold x 10 Seated pro/sup with 1# wt to fatigue Biceps curls 2# AW on wrist and elbow supported on blue bolster Standing shoulder flexion 2#AW on wrist 2 x 10 ea Standing biceps curls to OH press 2# AW 2x10 Theratube - wringing and bending  Mechanical Cervical static traction 23# x 10 min  12/15/23 UBE L2 x 4 min 2 fwd/bwd - PT present to discuss status Doorway stretch at 60 deg, 120 deg,  2x 30 sec ea Seated chin tucks 5 sec hold into purple ball 2 x 10 Seated lateral flexion and rotation 5 sec hold x 5 B Radial Nerve glides x 10 5 sec hold (see HEP) Prone on elbows  cervical diagonals x 10 B elbow to over opp shoulder - no change in crunching afterwards  Mechanical Cervical static  traction 23# x 10 min  12/13/23 UBE L2 x 6 min 3 fwd/bwd - PT present to discuss status Doorway stretch at 60 deg, 120 deg,  2x 30 sec ea Seated chin tucks 5 sec hold x 10, then into purple ball x 10 Standing scapular retraction x 10 on noodle --> L x 10, W x 10 Radial Nerve glides x 10 5 sec hold (see HEP) Blue Putty issued for HEP   Mechanical Cervical traction 45 sec on/15 sec off 20#/5# x 15 min  12/01/23 UBE L2 x 6 min 3 fwd/bwd - PT present to discuss status Doorway stretch at 90/90 2x 30 sec Nerve glides - ulnar and radial multiple variations tested - prayer best and added to HEP Manual: Trigger Point Dry Needling  Subsequent Treatment: Instructions provided previously at initial dry needling treatment.   Patient Verbal Consent Given: Yes Education Handout Provided: Previously Provided Muscles Treated: L ECRB, brachioradialis, triceps and UT Electrical Stimulation Performed: No Treatment Response/Outcome: Utilized skilled palpation to identify bony landmarks and trigger points.  Able to illicit twitch response and muscle elongation.  Soft tissue mobilization to forearm and triceps to further promote tissue elongation and decreased pain.      Mechanical Cervical traction 45 sec on/15 sec off 20#/5# x 15 min   11/25/23  Discussion of putty and UE ex's with a weight for upper quarter strengthening Manual therapy: left > right cervical musculature Trigger Point Dry Needling Initial Treatment: Pt instructed on Dry Needling rational, procedures, and possible side effects. Pt instructed to expect mild to moderate muscle soreness later in the day and/or into the next day.  Pt instructed in methods to reduce muscle soreness. Pt instructed to continue prescribed HEP. Patient verbalized understanding of these instructions and education.  Patient Verbal Consent Given:  Yes Education Handout Provided: Yes Muscles Treated: bil cervical multifidi; left upper trap; left levator scap Electrical Stimulation Performed: No Treatment Response/Outcome: improved soft tissue mobility Discussed after care including heat for soreness Mechanical Cervical traction 45 sec on/15 sec off 18#/5# x 12 min                                                                                                                            11/22/23 See pt ed and HEP  Mechanical Cervical traction 60 sec on/20 sec off 18#/5# x 12 min Putty - green putty for grip strength  PATIENT EDUCATION:  Education details: PT eval findings, anticipated POC, initial HEP, and putty issued green med  Person educated: Patient Education method: Explanation, Demonstration, and Handouts Education comprehension: verbalized understanding and returned demonstration  HOME EXERCISE PROGRAM: Putty, weight with wrist, forearm and elbow movements Access Code: [email or web address] URL: [email or web address] Date: 12/20/2023 Prepared by: Concha Deed  Exercises - Ulnar Nerve Flossing  - 2 x daily - 7 x weekly - 2 sets - 10 reps - Doorway Pec Stretch at 90 Degrees Abduction  - 1 x daily - 7 x weekly - 1  sets - 3 reps - 20-30 sec hold - Seated Cervical Retraction  - 2 x daily - 7 x weekly - 1 sets - 10 reps - 5 hold - Standing Radial Nerve Glide  - 2 x daily - 7 x weekly - 1 sets - 10 reps - 5 sec on/5 sec off hold - Shoulder External Rotation and Scapular Retraction  - 1 x daily - 7 x weekly - 2 sets - 10 reps - Seated Cervical Rotation AROM  - 1 x daily - 7 x weekly - 1 sets - 5-10 reps - 5 hold - Seated Cervical Sidebending AROM  - 1 x daily - 7 x weekly - 1 sets - 5-10 reps - 5 hold - Seated Cervical Extension AROM  - 1 x daily - 7 x weekly - 1 sets - 5-10 reps - 5 hold - Wrist AROM Flexion Extension  - 1 x daily - 7 x weekly - 2 sets - 10 reps - Seated Wrist Supination Pronation with Can  - 1 x daily - 7 x weekly  - 2 sets - 10 reps - Shoulder Abduction with Dumbbells - Palms Down  - 1 x daily - 7 x weekly - 2 sets - 10 reps - Standing Shoulder Flexion to 90 Degrees with Dumbbells  - 1 x daily - 7 x weekly - 2 sets - 10 reps  ASSESSMENT:  CLINICAL IMPRESSION: Initiated more UE strengthening today. Wrist extension very challenging and elbow and shoulder strengthening required ankle weight on wrist due to grip strength deficits. HEP updated. Patient continues to report improved neck mobility with traction so this was continued. He demonstrates improved B rotation, but this was not measured today. He continues to demonstrate potential for improvement and would benefit from continued skilled therapy to address impairments.     Eval:  Patient is a 58 y.o. male who was seen today for physical therapy evaluation and treatment for left cervcical radiculopathy. He has a h/o of neck issue for over 5 years. He also has been experiencing a flare up of Myasthenia Gravis affecting his vision. Patient reports pain in the neck and left UE primarily in the forearm and hand. He has numbness here as well. He has positive ULTT, ulnar > radial. He has weakness in his L UE and grip and muscles spasms in the forearm affecting ADLs including his ability to work in [employer]. He will benefit from skilled PT to address these deficits.  Trial of manual traction provided some relief during assessment, so trial of mechanical traction was done as well. No immediate relief noted.   OBJECTIVE IMPAIRMENTS: decreased activity tolerance, decreased strength, increased fascial restrictions, increased muscle spasms, impaired flexibility, impaired sensation, impaired UE functional use, postural dysfunction, and pain.   ACTIVITY LIMITATIONS: carrying, lifting, sleeping, bathing, dressing, and hygiene/grooming  PARTICIPATION LIMITATIONS: meal prep, cleaning, laundry, driving, shopping, community activity, occupation, and yard work  PERSONAL  FACTORS: Past/current experiences, Time since onset of injury/illness/exacerbation, and 1-2 comorbidities: DM and MG are also affecting patient's functional outcome.   REHAB POTENTIAL: Good  CLINICAL DECISION MAKING: Evolving/moderate complexity  EVALUATION COMPLEXITY: Moderate   GOALS: Goals reviewed with patient? Yes  SHORT TERM GOALS: Target date: 12/13/2023   Patient will be independent with initial HEP.  Baseline:  Goal status: MET 2.  Patient to report improved sensation in his L UE by 30% to aid in lifting and carrying. Baseline:  Goal status: IN PROGRESS   3.  Decreased pain in neck  and UE by 30% or more to improve sleep. Baseline:  Goal status: IN PROGRESS  4.  Pt to demo negative ULTT showing improved neural mobility Baseline:  Goal status: IN PROGRESS    LONG TERM GOALS: Target date: 01/17/2024   Patient will be independent with advanced/ongoing HEP to improve outcomes and carryover.  Baseline:  Goal status: INITIAL  2.  Patient will report 75% improvement in neck pain with ADLS.  Baseline:  Goal status: INITIAL  3.  report improved sensation in his L UE by 75% to aid in lifting and carrying. Baseline:  Goal status:INITIAL  4.  Patient will demonstrate improved left grip strength to 50# or better to normalize function with lifting. Baseline:  Goal status:INITIAL  5.  Patient will report 9 or less on NDI to demonstrate improved functional ability.  Baseline: 19 Goal status: INITIAL  6.  Patient will demonstrate improved UE strength to 5/5 to normalize ADLS.   Baseline:  Goal status: INITIAL     PLAN:  PT FREQUENCY: 2x/week  PT DURATION: 8 weeks  PLANNED INTERVENTIONS: 97164- PT Re-evaluation, 97110-Therapeutic exercises, 97530- Therapeutic activity, 97112- Neuromuscular re-education, 97535- Self Care, [id number]- Manual therapy, [id number]- Electrical stimulation (unattended), Patient/Family education, Taping, Dry Needling, Joint mobilization, Spinal  mobilization, Cryotherapy, and Moist heat  PLAN FOR NEXT SESSION:  continue with UE and grip strengthening, neck stabilization, manual/spinal mobs prn  Jinx Mourning, PT 12/20/23 10:57 AM Phone: [phone] Fax: [phone]

## 2023-12-20 ENCOUNTER — Ambulatory Visit
Admission: RE | Admit: 2023-12-20 | Discharge: 2023-12-20 | Disposition: A | Discharge status: Home / Self Care | Source: Ambulatory Visit | Attending: Neurology

## 2023-12-20 ENCOUNTER — Ambulatory Visit: Admitting: Physical Therapy

## 2023-12-20 ENCOUNTER — Other Ambulatory Visit (HOSPITAL_COMMUNITY): Payer: Self-pay

## 2023-12-20 ENCOUNTER — Other Ambulatory Visit: Payer: Self-pay | Admitting: Family Medicine

## 2023-12-20 ENCOUNTER — Other Ambulatory Visit: Payer: Self-pay

## 2023-12-20 ENCOUNTER — Encounter: Payer: Self-pay | Admitting: Physical Therapy

## 2023-12-20 DIAGNOSIS — M5412 Radiculopathy, cervical region: Secondary | ICD-10-CM

## 2023-12-20 DIAGNOSIS — M4802 Spinal stenosis, cervical region: Secondary | ICD-10-CM | POA: Diagnosis not present

## 2023-12-20 DIAGNOSIS — R29898 Other symptoms and signs involving the musculoskeletal system: Secondary | ICD-10-CM

## 2023-12-20 DIAGNOSIS — G7001 Myasthenia gravis with (acute) exacerbation: Secondary | ICD-10-CM

## 2023-12-20 DIAGNOSIS — R292 Abnormal reflex: Secondary | ICD-10-CM

## 2023-12-20 DIAGNOSIS — R252 Cramp and spasm: Secondary | ICD-10-CM | POA: Diagnosis not present

## 2023-12-20 DIAGNOSIS — R399 Unspecified symptoms and signs involving the genitourinary system: Secondary | ICD-10-CM

## 2023-12-20 DIAGNOSIS — M6281 Muscle weakness (generalized): Secondary | ICD-10-CM | POA: Diagnosis not present

## 2023-12-20 DIAGNOSIS — M47812 Spondylosis without myelopathy or radiculopathy, cervical region: Secondary | ICD-10-CM | POA: Diagnosis not present

## 2023-12-20 MED ORDER — TAMSULOSIN HCL 0.4 MG PO CAPS
0.4000 mg | ORAL_CAPSULE | Freq: Every day | ORAL | 3 refills | Status: AC
  Filled 2023-12-20: qty 90, 90d supply, fill #0
  Filled 2023-12-27 – 2024-04-03 (×2): qty 90, 90d supply, fill #1
  Filled 2024-07-02: qty 90, 90d supply, fill #2

## 2023-12-21 ENCOUNTER — Ambulatory Visit

## 2023-12-21 VITALS — BP 130/82 | HR 84 | Temp 98.5°F | Resp 18 | Ht 71.0 in | Wt 187.2 lb

## 2023-12-21 DIAGNOSIS — G7000 Myasthenia gravis without (acute) exacerbation: Secondary | ICD-10-CM | POA: Diagnosis not present

## 2023-12-21 MED ORDER — EFGARTIGIMOD ALFA-HYALUR-QVFC 180-2000 MG-UNIT/ML ~~LOC~~ SOLN
1008.0000 mg | Freq: Once | SUBCUTANEOUS | Status: AC
  Administered 2023-12-21: 1008 mg via SUBCUTANEOUS
  Filled 2023-12-21: qty 33.6

## 2023-12-21 NOTE — Progress Notes (Signed)
 Diagnosis: Myasthenia Gravis  Provider:  Mannam, Praveen MD  Procedure: Injection  Vyvgart  Hytrulo, Dose: 1008mg , Site: subcutaneous, Number of injections: 1  Injection Site(s): Left lower quad. abdomen  Post Care: Observation period completed and Subcutaneous infusion needle discontinued  Discharge: Condition: Good, Destination: Home . AVS Declined  Performed by:  Chemeka Filice, RN

## 2023-12-21 NOTE — Therapy (Signed)
 OUTPATIENT PHYSICAL THERAPY CERVICAL TREATMENT NOTE   Patient Name: Mark Black MRN: [id number] DOB:1966-02-24, 58 y.o., male Today's Date: 12/22/2023  END OF SESSION:  PT End of Session - 12/22/23 1016     Visit Number 7    Date for PT Re-Evaluation 01/17/24    Authorization Type [place] [employer]ET[employer]    PT Start Time 1016    PT Stop Time 1058    PT Time Calculation (min) 42 min    Activity Tolerance Patient tolerated treatment well    Behavior During Therapy [facility] for tasks assessed/performed                  Past Medical History:  Diagnosis Date   Allergic rhinitis 12/24/2017   Allergy    Alopecia totalis    Anxiety 12/24/2017   Asthma    as a child   Chicken pox    Diabetes mellitus without complication (HCC)    Elevated liver enzymes 10/13/2020   Essential hypertension 02/10/2013   Eyelid abnormality 04/23/2021   GERD (gastroesophageal reflux disease) 01/27/2019   Hypogonadism in male 06/26/2019   Hypothyroidism 02/10/2013   Lower urinary tract symptoms (LUTS) 12/24/2017   Screening for colon cancer 12/24/2017   Suspected COVID-19 virus infection 06/20/2020   T2DM (type 2 diabetes mellitus) (HCC) 03/21/2018   Vitiligo    Past Surgical History:  Procedure Laterality Date   CARPAL TUNNEL RELEASE     Bil   NASAL SINUS SURGERY  1994   cut and open air passages/nostrils   Patient Active Problem List   Diagnosis Date Noted   Erectile dysfunction 08/09/2023   Hemorrhoid 01/27/2023   Myasthenia gravis without acute exacerbation (HCC) 05/01/2022   Hypertriglyceridemia 10/24/2021   Allergy 10/23/2021   Alopecia totalis 10/23/2021   Asthma 10/23/2021   Chicken pox 10/23/2021   Diabetes mellitus without complication (HCC) 10/23/2021   Vitiligo 10/23/2021   Eyelid abnormality 04/23/2021   Elevated liver enzymes 10/13/2020   Suspected COVID-19 virus infection 06/20/2020   Hypogonadism in male 06/26/2019   GERD (gastroesophageal reflux disease) 01/27/2019   T2DM (type 2  diabetes mellitus) (HCC) 03/21/2018   Anxiety 12/24/2017   Allergic rhinitis 12/24/2017   Lower urinary tract symptoms (LUTS) 12/24/2017   Screening for colon cancer 12/24/2017   Essential hypertension 02/10/2013   Hypothyroidism 02/10/2013    PCP: Adela Holter, DO   REFERRING PROVIDER: Daryel Ensign, DO   REFERRING DIAG:  [phone] (ICD-10-CM) - Cervical radiculopathy  G70.01 (ICD-10-CM) - Myasthenia gravis with exacerbation (HCC)    THERAPY DIAG:  Radiculopathy, cervical region  Cramp and spasm  Muscle weakness (generalized)  Rationale for Evaluation and Treatment: Rehabilitation  ONSET DATE: 5 months ago  SUBJECTIVE:  SUBJECTIVE STATEMENT:  I'm getting more mobility in my neck and my wrist too. Had MRI Monday, results will take 1-2 weeks.   Started having weakness with left grip. It stays tense in the neck. He has had spasms in the left forearm as well. Forearm and hand are numb. Due to flare up of MG, he delayed seeking treatment for this. Also reports R shoulder pain.  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 Yes: NPRS scale: 0/10 Pain location: B neck left worse Pain description: constant ache Aggravating factors: nothing that he has noticed Relieving factors: rest  PRECAUTIONS: None  RED FLAGS: None     WEIGHT BEARING RESTRICTIONS: No  FALLS:  Has patient fallen in last 6 months? No  LIVING ENVIRONMENT: Lives with: lives with their family  OCCUPATION: worked at [employer] but was layed off in [employer]  PLOF: Independent  PATIENT GOALS: get my strength back in my left arm  NEXT MD VISIT: 4-6 weeks  OBJECTIVE:  Note: Objective  measures were completed at Evaluation unless otherwise noted.  DIAGNOSTIC FINDINGS:   NCV test some right RTC and pinched nerve in neck  PATIENT SURVEYS:  NDI 19 / 50 = 38.0 %  COGNITION: Overall cognitive status: Within functional limits for tasks assessed  SENSATION: Not tested  POSTURE: rounded shoulders  PALPATION: Left SO, brachioradialis   CERVICAL ROM: * pain  Active ROM A/PROM (deg) eval 12/22/23  Flexion full   Extension full   Right lateral flexion 28   Left lateral flexion 28*   Right rotation 50 50  Left rotation 40 47   (Blank rows = not tested)  UPPER EXTREMITY ROM: WNL   UPPER EXTREMITY MMT:  MMT Right eval Left eval  Shoulder flexion  4+  Shoulder extension    Shoulder abduction  4+  Shoulder adduction    Shoulder extension  5  Shoulder internal rotation    Shoulder external rotation    Middle trapezius    Lower trapezius    Elbow flexion  4+  Elbow extension  5  Wrist flexion  5  Wrist extension  3+  Wrist ulnar deviation    Wrist radial deviation    Wrist pronation  4  Wrist supination  4  Grip strength 80 30   (Blank rows = not tested)  CERVICAL SPECIAL TESTS:  Upper limb tension test (ULTT): Positive, Spurling's test: Negative, and Distraction test: Positive  ulnar and radial   TREATMENT DATE:     12/22/23 UBE L2 x 4 min 2 fwd/bwd - PT present to discuss status Seated wrist ext gravity eliminated x 10, 1# arm supported on bolster Seated wrist ext against gravity on bolster 5 sec hold 2x5 Seated pro/sup with 1# wt to fatigue Biceps curls 2# AW on wrist and elbow supported on blue bolster: Palm up x 10,  palm down x 10, hammer curl x 10 Standing shoulder flexion 2#AW on wrist and holding 1# wt 2 x 10  Standing shoulder ABD 2#AW on wrist  and some done with 1# wt 2x10 Theratube - wringing and bending to fatigue Seated wrist radial and ulnar deviation with R hand stabilizing left wrist  Mechanical Cervical static traction 23# x  10 min   12/20/23 UBE L2 x 4 min 2 fwd/bwd - PT present to discuss status Seated wrist ext gravity eliminated x 10,  then with 5 sec hold x 10 Seated pro/sup with 1# wt to fatigue Biceps curls 2# AW on wrist and elbow supported on blue bolster Standing shoulder flexion 2#AW on wrist 2 x 10 ea Standing biceps curls to OH press 2# AW 2x10 Theratube - wringing and bending  Mechanical Cervical static traction 23# x 10 min  12/15/23 UBE L2 x 4 min 2 fwd/bwd - PT present to discuss status Doorway stretch at 60 deg, 120 deg,  2x 30 sec ea Seated chin tucks 5 sec hold into purple ball 2 x 10 Seated lateral flexion and rotation 5 sec hold x 5 B Radial Nerve glides x 10 5 sec hold (see HEP) Prone on elbows cervical diagonals x 10 B elbow to over opp shoulder - no change in crunching afterwards  Mechanical Cervical static traction 23# x 10 min  12/13/23 UBE L2 x 6 min 3 fwd/bwd - PT present to discuss status Doorway stretch at 60 deg, 120 deg,  2x 30 sec ea Seated chin tucks 5 sec hold x 10, then into purple ball x 10 Standing scapular retraction x 10 on noodle --> L x 10, W x 10 Radial Nerve glides x 10 5 sec hold (see HEP) Blue Putty issued for HEP   Mechanical Cervical traction 45 sec on/15 sec off 20#/5# x 15 min   PATIENT EDUCATION:  Education details: PT eval findings, anticipated POC, initial HEP, and putty issued green med  Person educated: Patient Education method: Explanation, Demonstration, and Handouts Education comprehension: verbalized understanding and returned demonstration  HOME EXERCISE PROGRAM: Putty, weight with wrist, forearm and elbow movements Access Code: [email or web address] URL: [email or web address] Date: 12/20/2023 Prepared by: Concha Deed  Exercises - Ulnar Nerve Flossing  - 2 x daily - 7 x weekly - 2 sets - 10 reps - Doorway Pec Stretch at 90 Degrees Abduction  - 1 x daily - 7 x weekly - 1 sets - 3 reps - 20-30 sec hold - Seated Cervical Retraction  - 2 x  daily - 7 x weekly - 1 sets - 10 reps - 5 hold - Standing Radial Nerve Glide  - 2 x daily - 7 x weekly - 1 sets - 10 reps - 5 sec on/5 sec off hold - Shoulder External Rotation and Scapular Retraction  - 1 x daily - 7 x weekly - 2 sets - 10 reps - Seated Cervical Rotation AROM  - 1 x daily - 7 x weekly - 1 sets - 5-10 reps - 5 hold - Seated Cervical Sidebending AROM  - 1 x daily - 7 x weekly - 1 sets - 5-10 reps - 5 hold - Seated Cervical Extension AROM  - 1 x daily - 7 x weekly - 1 sets - 5-10 reps - 5 hold - Wrist AROM Flexion Extension  - 1 x daily - 7 x weekly - 2 sets - 10 reps - Seated Wrist Supination Pronation with Can  - 1 x daily - 7 x weekly - 2 sets - 10 reps - Shoulder Abduction with Dumbbells - Palms Down  - 1 x daily - 7 x weekly - 2 sets - 10 reps - Standing Shoulder Flexion to 90 Degrees with Dumbbells  - 1 x daily - 7 x weekly - 2 sets - 10 reps  ASSESSMENT:  CLINICAL IMPRESSION: Patient demonstrated improved strength with wrist extension today. He was able to raise wrist against gravity. He has improved left rotation by 7 deg and reports 20% improvement  in neck pain. He continues to have positive radial nerve tension reported in upper arm ant and post.    Eval:  Patient is a 58 y.o. male who was seen today for physical therapy evaluation and treatment for left cervcical radiculopathy. He has a h/o of neck issue for over 5 years. He also has been experiencing a flare up of Myasthenia Gravis affecting his vision. Patient reports pain in the neck and left UE primarily in the forearm and hand. He has numbness here as well. He has positive ULTT, ulnar > radial. He has weakness in his L UE and grip and muscles spasms in the forearm affecting ADLs including his ability to work in [employer]. He will benefit from skilled PT to address these deficits.  Trial of manual traction provided some relief during assessment, so trial of mechanical traction was done as well. No immediate relief  noted.   OBJECTIVE IMPAIRMENTS: decreased activity tolerance, decreased strength, increased fascial restrictions, increased muscle spasms, impaired flexibility, impaired sensation, impaired UE functional use, postural dysfunction, and pain.   ACTIVITY LIMITATIONS: carrying, lifting, sleeping, bathing, dressing, and hygiene/grooming  PARTICIPATION LIMITATIONS: meal prep, cleaning, laundry, driving, shopping, community activity, occupation, and yard work  PERSONAL FACTORS: Past/current experiences, Time since onset of injury/illness/exacerbation, and 1-2 comorbidities: DM and MG are also affecting patient's functional outcome.   REHAB POTENTIAL: Good  CLINICAL DECISION MAKING: Evolving/moderate complexity  EVALUATION COMPLEXITY: Moderate   GOALS: Goals reviewed with patient? Yes  SHORT TERM GOALS: Target date: 12/13/2023   Patient will be independent with initial HEP.  Baseline:  Goal status: MET 2.  Patient to report improved sensation in his L UE by 30% to aid in lifting and carrying. Baseline:  Goal status: IN PROGRESS   3.  Decreased pain in neck and UE by 30% or more to improve sleep. Baseline:  Goal status: IN PROGRESS 20% 12/22/23  4.  Pt to demo negative ULTT showing improved neural mobility Baseline:  Goal status: IN PROGRESS  12/22/23    LONG TERM GOALS: Target date: 01/17/2024   Patient will be independent with advanced/ongoing HEP to improve outcomes and carryover.  Baseline:  Goal status: INITIAL  2.  Patient will report 75% improvement in neck pain with ADLS.  Baseline:  Goal status: INITIAL  3.  report improved sensation in his L UE by 75% to aid in lifting and carrying. Baseline:  Goal status:INITIAL  4.  Patient will demonstrate improved left grip strength to 50# or better to normalize function with lifting. Baseline:  Goal status:INITIAL  5.  Patient will report 9 or less on NDI to demonstrate improved functional ability.  Baseline: 19 Goal status:  INITIAL  6.  Patient will demonstrate improved UE strength to 5/5 to normalize ADLS.   Baseline:  Goal status: INITIAL     PLAN:  PT FREQUENCY: 2x/week  PT DURATION: 8 weeks  PLANNED INTERVENTIONS: 97164- PT Re-evaluation, 97110-Therapeutic exercises, 97530- Therapeutic activity, 97112- Neuromuscular re-education, 97535- Self Care, 16109- Manual therapy, [id number]- Electrical stimulation (unattended), Patient/Family education, Taping, Dry Needling, Joint mobilization, Spinal mobilization, Cryotherapy, and Moist heat  PLAN FOR NEXT SESSION:  continue with UE and grip strengthening, radial nerve glides  Jinx Mourning, PT 12/22/23 10:51 AM Phone: [phone] Fax: [phone]

## 2023-12-22 ENCOUNTER — Encounter: Payer: Self-pay | Admitting: Physical Therapy

## 2023-12-22 ENCOUNTER — Ambulatory Visit: Admitting: Physical Therapy

## 2023-12-22 DIAGNOSIS — R252 Cramp and spasm: Secondary | ICD-10-CM | POA: Diagnosis not present

## 2023-12-22 DIAGNOSIS — M6281 Muscle weakness (generalized): Secondary | ICD-10-CM

## 2023-12-22 DIAGNOSIS — M5412 Radiculopathy, cervical region: Secondary | ICD-10-CM

## 2023-12-26 NOTE — Therapy (Signed)
 OUTPATIENT PHYSICAL THERAPY CERVICAL TREATMENT NOTE   Patient Name: Mark Black MRN: [id number] DOB:[birth year], 58 y.o., male Today's Date: 12/27/2023  END OF SESSION:  PT End of Session - 12/27/23 0850     Visit Number 8    Date for PT Re-Evaluation 01/17/24    Authorization Type [place] [employer]    PT Start Time 0845    PT Stop Time 0930    PT Time Calculation (min) 45 min    Activity Tolerance Patient tolerated treatment well    Behavior During Therapy [facility] for tasks assessed/performed                Past Medical History:  Diagnosis Date   Allergic rhinitis 12/24/2017   Allergy    Alopecia totalis    Anxiety 12/24/2017   Asthma    as a child   Chicken pox    Diabetes mellitus without complication (HCC)    Elevated liver enzymes 10/13/2020   Essential hypertension 02/10/2013   Eyelid abnormality 04/23/2021   GERD (gastroesophageal reflux disease) 01/27/2019   Hypogonadism in male 06/26/2019   Hypothyroidism 02/10/2013   Lower urinary tract symptoms (LUTS) 12/24/2017   Screening for colon cancer 12/24/2017   Suspected COVID-19 virus infection 06/20/2020   T2DM (type 2 diabetes mellitus) (HCC) 03/21/2018   Vitiligo    Past Surgical History:  Procedure Laterality Date   CARPAL TUNNEL RELEASE     Bil   NASAL SINUS SURGERY  1994   cut and open air passages/nostrils   Patient Active Problem List   Diagnosis Date Noted   Erectile dysfunction 08/09/2023   Hemorrhoid 01/27/2023   Myasthenia gravis without acute exacerbation (HCC) 05/01/2022   Hypertriglyceridemia 10/24/2021   Allergy 10/23/2021   Alopecia totalis 10/23/2021   Asthma 10/23/2021   Chicken pox 10/23/2021   Diabetes mellitus without complication (HCC) 10/23/2021   Vitiligo 10/23/2021   Eyelid abnormality 04/23/2021   Elevated liver enzymes 10/13/2020   Suspected COVID-19 virus infection 06/20/2020   Hypogonadism in male 06/26/2019   GERD (gastroesophageal reflux disease) 01/27/2019   T2DM (type 2  diabetes mellitus) (HCC) 03/21/2018   Anxiety 12/24/2017   Allergic rhinitis 12/24/2017   Lower urinary tract symptoms (LUTS) 12/24/2017   Screening for colon cancer 12/24/2017   Essential hypertension 02/10/2013   Hypothyroidism 02/10/2013    PCP: Adela Holter, DO   REFERRING PROVIDER: Daryel Ensign, DO   REFERRING DIAG:  [phone] (ICD-10-CM) - Cervical radiculopathy  G70.01 (ICD-10-CM) - Myasthenia gravis with exacerbation (HCC)    THERAPY DIAG:  Radiculopathy, cervical region  Cramp and spasm  Muscle weakness (generalized)  Rationale for Evaluation and Treatment: Rehabilitation  ONSET DATE: 5 months ago  SUBJECTIVE:  SUBJECTIVE STATEMENT:  My mobility keeps getting better.  Had MRI 12/20/23, results will take 1-2 weeks.   Started having weakness with left grip. It stays tense in the neck. He has had spasms in the left forearm as well. Forearm and hand are numb. Due to flare up of MG, he delayed seeking treatment for this. Also reports R shoulder pain.  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 Yes: NPRS scale: 0/10 Pain location: B neck left worse Pain description: constant ache Aggravating factors: nothing that he has noticed Relieving factors: rest  PRECAUTIONS: None  RED FLAGS: None     WEIGHT BEARING RESTRICTIONS: No  FALLS:  Has patient fallen in last 6 months? No  LIVING ENVIRONMENT: Lives with: lives with their family  OCCUPATION: worked at [employer] but was layed off in [employer]  PLOF: Independent  PATIENT GOALS: get my strength back in my left arm  NEXT MD VISIT: 4-6 weeks  OBJECTIVE:  Note: Objective measures were  completed at Evaluation unless otherwise noted.  DIAGNOSTIC FINDINGS:   NCV test some right RTC and pinched nerve in neck  PATIENT SURVEYS:  NDI 19 / 50 = 38.0 %  COGNITION: Overall cognitive status: Within functional limits for tasks assessed  SENSATION: Not tested  POSTURE: rounded shoulders  PALPATION: Left SO, brachioradialis   CERVICAL ROM: * pain  Active ROM A/PROM (deg) eval 12/22/23  Flexion full   Extension full   Right lateral flexion 28   Left lateral flexion 28*   Right rotation 50 50  Left rotation 40 47   (Blank rows = not tested)  UPPER EXTREMITY ROM: WNL   UPPER EXTREMITY MMT:  MMT Right eval Left eval Left 12/27/23  Shoulder flexion  4+ 5  Shoulder extension     Shoulder abduction  4+ 4+  Shoulder adduction     Shoulder extension  5   Shoulder internal rotation     Shoulder external rotation     Middle trapezius     Lower trapezius     Elbow flexion  4+   Elbow extension  5   Wrist flexion  5   Wrist extension  3+   Wrist ulnar deviation     Wrist radial deviation     Wrist pronation  4   Wrist supination  4   Grip strength 80 30 33   (Blank rows = not tested)  CERVICAL SPECIAL TESTS:  Upper limb tension test (ULTT): Positive, Spurling's test: Negative, and Distraction test: Positive  ulnar and radial   TREATMENT DATE:     12/27/23 UBE L2 x 4 min 2 fwd/bwd - PT present to discuss status Seated wrist ext gravity eliminated x 10, 1# arm supported on bolster Seated wrist ext against gravity on bolster 5 sec hold x 10 Seated pro/sup with 1# wt to fatigue Individual finger extension done together with R hand Biceps curls 2# AW on wrist and elbow supported on blue bolster: Palm up x 10,  palm down x 10, hammer curl x 10 using R UE to block rist  Standing shoulder flexion x 10, then  2#AW on wrist and holding 1# wt  x 10 holding 5 sec Standing shoulder ABD 2#AW on wrist  and some done with 1# wt x5 feels in rotator cuff holding 5  sec   Mechanical Cervical static traction 23#  x 10 min   12/22/23 UBE L2 x 4 min 2 fwd/bwd - PT present to discuss status Seated wrist ext gravity eliminated x 10, 1# arm supported on bolster Seated wrist ext against gravity on bolster 5 sec hold 2x5 Seated pro/sup with 1# wt to fatigue Biceps curls 2# AW on wrist and elbow supported on blue bolster: Palm up x 10,  palm down x 10, hammer curl x 10 Standing shoulder flexion 2#AW on wrist and holding 1# wt 2 x 10  Standing shoulder ABD 2#AW on wrist  and some done with 1# wt 2x10 Theratube - wringing and bending to fatigue Seated wrist radial and ulnar deviation with R hand stabilizing left wrist  Mechanical Cervical static traction 23# x 10 min   12/20/23 UBE L2 x 4 min 2 fwd/bwd - PT present to discuss status Seated wrist ext gravity eliminated x 10, then with 5 sec hold x 10 Seated pro/sup with 1# wt to fatigue Biceps curls 2# AW on wrist and elbow supported on blue bolster Standing shoulder flexion 2#AW on wrist 2 x 10 ea Standing biceps curls to OH press 2# AW 2x10 Theratube - wringing and bending  Mechanical Cervical static traction 23# x 10 min  12/15/23 UBE L2 x 4 min 2 fwd/bwd - PT present to discuss status Doorway stretch at 60 deg, 120 deg,  2x 30 sec ea Seated chin tucks 5 sec hold into purple ball 2 x 10 Seated lateral flexion and rotation 5 sec hold x 5 B Radial Nerve glides x 10 5 sec hold (see HEP) Prone on elbows cervical diagonals x 10 B elbow to over opp shoulder - no change in crunching afterwards  Mechanical Cervical static traction 23# x 10 min  12/13/23 UBE L2 x 6 min 3 fwd/bwd - PT present to discuss status Doorway stretch at 60 deg, 120 deg,  2x 30 sec ea Seated chin tucks 5 sec hold x 10, then into purple ball x 10 Standing scapular retraction x 10 on noodle --> L x 10, W x 10 Radial Nerve glides x 10 5 sec hold (see HEP) Blue Putty issued for HEP   Mechanical Cervical traction 45 sec on/15 sec off  20#/5# x 15 min   PATIENT EDUCATION:  Education details: PT eval findings, anticipated POC, initial HEP, and putty issued green med  Person educated: Patient Education method: Explanation, Demonstration, and Handouts Education comprehension: verbalized understanding and returned demonstration  HOME EXERCISE PROGRAM: Putty, weight with wrist, forearm and elbow movements Access Code: [email or web address] URL: [email or web address] Date: 12/20/2023 Prepared by: Concha Deed  Exercises - Ulnar Nerve Flossing  - 2 x daily - 7 x weekly - 2 sets - 10 reps - Doorway Pec Stretch at 90 Degrees Abduction  - 1 x daily - 7 x weekly - 1 sets - 3 reps - 20-30 sec hold - Seated Cervical Retraction  - 2 x daily - 7 x weekly - 1 sets - 10 reps - 5 hold - Standing Radial Nerve Glide  - 2 x daily - 7 x weekly - 1 sets - 10 reps - 5 sec on/5 sec off hold - Shoulder External Rotation and Scapular Retraction  - 1 x daily - 7 x weekly - 2 sets - 10 reps - Seated Cervical Rotation AROM  - 1 x daily - 7 x weekly - 1 sets - 5-10 reps - 5 hold - Seated Cervical Sidebending AROM  - 1 x daily - 7 x weekly -  1 sets - 5-10 reps - 5 hold - Seated Cervical Extension AROM  - 1 x daily - 7 x weekly - 1 sets - 5-10 reps - 5 hold - Wrist AROM Flexion Extension  - 1 x daily - 7 x weekly - 2 sets - 10 reps - Seated Wrist Supination Pronation with Can  - 1 x daily - 7 x weekly - 2 sets - 10 reps - Shoulder Abduction with Dumbbells - Palms Down  - 1 x daily - 7 x weekly - 2 sets - 10 reps - Standing Shoulder Flexion to 90 Degrees with Dumbbells  - 1 x daily - 7 x weekly - 2 sets - 10 reps  ASSESSMENT:  CLINICAL IMPRESSION: Patient demonstrates improved L shoulder flex to 5/5 and small increase in Left grip strength (3#). Left wrist extension still 3+/5 although patient is able to hold the wrist in extension longer before it fatigues. His wrist and neck mobility are improving. He continues to have neural tension in the L UE. We  added finger extension today. Plan to add scapular strengthening next visit in addition to shoulder ER. Continues to report improvement with mechanical traction.    Eval:  Patient is a 58 y.o. male who was seen today for physical therapy evaluation and treatment for left cervcical radiculopathy. He has a h/o of neck issue for over 5 years. He also has been experiencing a flare up of Myasthenia Gravis affecting his vision. Patient reports pain in the neck and left UE primarily in the forearm and hand. He has numbness here as well. He has positive ULTT, ulnar > radial. He has weakness in his L UE and grip and muscles spasms in the forearm affecting ADLs including his ability to work in [employer]. He will benefit from skilled PT to address these deficits.  Trial of manual traction provided some relief during assessment, so trial of mechanical traction was done as well. No immediate relief noted.   OBJECTIVE IMPAIRMENTS: decreased activity tolerance, decreased strength, increased fascial restrictions, increased muscle spasms, impaired flexibility, impaired sensation, impaired UE functional use, postural dysfunction, and pain.   ACTIVITY LIMITATIONS: carrying, lifting, sleeping, bathing, dressing, and hygiene/grooming  PARTICIPATION LIMITATIONS: meal prep, cleaning, laundry, driving, shopping, community activity, occupation, and yard work  PERSONAL FACTORS: Past/current experiences, Time since onset of injury/illness/exacerbation, and 1-2 comorbidities: DM and MG are also affecting patient's functional outcome.   REHAB POTENTIAL: Good  CLINICAL DECISION MAKING: Evolving/moderate complexity  EVALUATION COMPLEXITY: Moderate   GOALS: Goals reviewed with patient? Yes  SHORT TERM GOALS: Target date: 12/13/2023   Patient will be independent with initial HEP.  Baseline:  Goal status: MET 2.  Patient to report improved sensation in his L UE by 30% to aid in lifting and carrying. Baseline:  Goal  status: IN PROGRESS   3.  Decreased pain in neck and UE by 30% or more to improve sleep. Baseline:  Goal status: IN PROGRESS 20% 12/22/23  4.  Pt to demo negative ULTT showing improved neural mobility Baseline:  Goal status: IN PROGRESS  12/22/23    LONG TERM GOALS: Target date: 01/17/2024   Patient will be independent with advanced/ongoing HEP to improve outcomes and carryover.  Baseline:  Goal status: INITIAL  2.  Patient will report 75% improvement in neck pain with ADLS.  Baseline:  Goal status: INITIAL  3.  report improved sensation in his L UE by 75% to aid in lifting and carrying. Baseline:  Goal status:INITIAL  4.  Patient will demonstrate improved left grip strength to 50# or better to normalize function with lifting. Baseline:  Goal status:INITIAL  5.  Patient will report 9 or less on NDI to demonstrate improved functional ability.  Baseline: 19 Goal status: INITIAL  6.  Patient will demonstrate improved UE strength to 5/5 to normalize ADLS.   Baseline:  Goal status: INITIAL     PLAN:  PT FREQUENCY: 2x/week  PT DURATION: 8 weeks  PLANNED INTERVENTIONS: 97164- PT Re-evaluation, 97110-Therapeutic exercises, 97530- Therapeutic activity, 97112- Neuromuscular re-education, 97535- Self Care, 16109- Manual therapy, [id number]- Electrical stimulation (unattended), Patient/Family education, Taping, Dry Needling, Joint mobilization, Spinal mobilization, Cryotherapy, and Moist heat  PLAN FOR NEXT SESSION:  Add ER and rows/ext, continue with UE and grip strengthening, radial nerve glides  Jinx Mourning, PT 12/27/23 9:25 AM Phone: [phone] Fax: [phone]

## 2023-12-27 ENCOUNTER — Other Ambulatory Visit: Payer: Self-pay | Admitting: Family Medicine

## 2023-12-27 ENCOUNTER — Encounter: Payer: Self-pay | Admitting: Physical Therapy

## 2023-12-27 ENCOUNTER — Ambulatory Visit: Admitting: Physical Therapy

## 2023-12-27 DIAGNOSIS — R252 Cramp and spasm: Secondary | ICD-10-CM

## 2023-12-27 DIAGNOSIS — E119 Type 2 diabetes mellitus without complications: Secondary | ICD-10-CM

## 2023-12-27 DIAGNOSIS — M6281 Muscle weakness (generalized): Secondary | ICD-10-CM

## 2023-12-27 DIAGNOSIS — M5412 Radiculopathy, cervical region: Secondary | ICD-10-CM | POA: Diagnosis not present

## 2023-12-28 ENCOUNTER — Other Ambulatory Visit (HOSPITAL_COMMUNITY): Payer: Self-pay

## 2023-12-28 ENCOUNTER — Other Ambulatory Visit: Payer: Self-pay

## 2023-12-28 ENCOUNTER — Ambulatory Visit: Admitting: *Deleted

## 2023-12-28 VITALS — BP 128/86 | HR 91 | Temp 98.2°F | Resp 14 | Ht 71.0 in | Wt 187.4 lb

## 2023-12-28 DIAGNOSIS — G7000 Myasthenia gravis without (acute) exacerbation: Secondary | ICD-10-CM

## 2023-12-28 MED ORDER — MOUNJARO 7.5 MG/0.5ML ~~LOC~~ SOAJ
7.5000 mg | SUBCUTANEOUS | 0 refills | Status: DC
Start: 2023-12-28 — End: 2024-02-13
  Filled 2023-12-28: qty 6, 84d supply, fill #0

## 2023-12-28 MED ORDER — EFGARTIGIMOD ALFA-HYALUR-QVFC 180-2000 MG-UNIT/ML ~~LOC~~ SOLN
1008.0000 mg | Freq: Once | SUBCUTANEOUS | Status: AC
  Administered 2023-12-28: 1008 mg via SUBCUTANEOUS
  Filled 2023-12-28: qty 5.6

## 2023-12-28 NOTE — Progress Notes (Signed)
 Diagnosis: Myasthenia Gravis  Provider:  Mannam, Praveen MD  Procedure: Injection  vygart, Dose: 1008 mg, Site: subcutaneous, Number of injections: 3  Injection Site(s): Right lower quad. abdomne  Post Care: Observation period completed  Discharge: Condition: Good, Destination: Home . AVS Declined  Performed by:  Devonne Folk, RN

## 2023-12-28 NOTE — Therapy (Signed)
 OUTPATIENT PHYSICAL THERAPY CERVICAL TREATMENT NOTE   Patient Name: Mark Black MRN: [id number] DOB:[birth year], 58 y.o., male Today's Date: 12/29/2023  END OF SESSION:  PT End of Session - 12/29/23 1148     Visit Number 9    Date for PT Re-Evaluation 01/17/24    Authorization Type [place] [employer]    PT Start Time 1147    PT Stop Time 1231    PT Time Calculation (min) 44 min    Activity Tolerance Patient tolerated treatment well    Behavior During Therapy [facility] for tasks assessed/performed                 Past Medical History:  Diagnosis Date   Allergic rhinitis 12/24/2017   Allergy    Alopecia totalis    Anxiety 12/24/2017   Asthma    as a child   Chicken pox    Diabetes mellitus without complication (HCC)    Elevated liver enzymes 10/13/2020   Essential hypertension 02/10/2013   Eyelid abnormality 04/23/2021   GERD (gastroesophageal reflux disease) 01/27/2019   Hypogonadism in male 06/26/2019   Hypothyroidism 02/10/2013   Lower urinary tract symptoms (LUTS) 12/24/2017   Screening for colon cancer 12/24/2017   Suspected COVID-19 virus infection 06/20/2020   T2DM (type 2 diabetes mellitus) (HCC) 03/21/2018   Vitiligo    Past Surgical History:  Procedure Laterality Date   CARPAL TUNNEL RELEASE     Bil   NASAL SINUS SURGERY  1994   cut and open air passages/nostrils   Patient Active Problem List   Diagnosis Date Noted   Erectile dysfunction 08/09/2023   Hemorrhoid 01/27/2023   Myasthenia gravis without acute exacerbation (HCC) 05/01/2022   Hypertriglyceridemia 10/24/2021   Allergy 10/23/2021   Alopecia totalis 10/23/2021   Asthma 10/23/2021   Chicken pox 10/23/2021   Diabetes mellitus without complication (HCC) 10/23/2021   Vitiligo 10/23/2021   Eyelid abnormality 04/23/2021   Elevated liver enzymes 10/13/2020   Suspected COVID-19 virus infection 06/20/2020   Hypogonadism in male 06/26/2019   GERD (gastroesophageal reflux disease) 01/27/2019   T2DM (type 2  diabetes mellitus) (HCC) 03/21/2018   Anxiety 12/24/2017   Allergic rhinitis 12/24/2017   Lower urinary tract symptoms (LUTS) 12/24/2017   Screening for colon cancer 12/24/2017   Essential hypertension 02/10/2013   Hypothyroidism 02/10/2013    PCP: Adela Holter, DO   REFERRING PROVIDER: Daryel Ensign, DO   REFERRING DIAG:  [phone] (ICD-10-CM) - Cervical radiculopathy  G70.01 (ICD-10-CM) - Myasthenia gravis with exacerbation (HCC)    THERAPY DIAG:  Radiculopathy, cervical region  Cramp and spasm  Muscle weakness (generalized)  Rationale for Evaluation and Treatment: Rehabilitation  ONSET DATE: 5 months ago  SUBJECTIVE:  SUBJECTIVE STATEMENT:  No MRI results yet.   Had MRI 12/20/23, results will take 1-2 weeks.   Started having weakness with left grip. It stays tense in the neck. He has had spasms in the left forearm as well. Forearm and hand are numb. Due to flare up of MG, he delayed seeking treatment for this. Also reports R shoulder pain.  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 Yes: NPRS scale: 0/10 Pain location: B neck left worse Pain description: constant ache Aggravating factors: nothing that he has noticed Relieving factors: rest  PRECAUTIONS: None  RED FLAGS: None     WEIGHT BEARING RESTRICTIONS: No  FALLS:  Has patient fallen in last 6 months? No  LIVING ENVIRONMENT: Lives with: lives with their family  OCCUPATION: worked at [employer] but was layed off in [employer]  PLOF: Independent  PATIENT GOALS: get my strength back in my left arm  NEXT MD VISIT: 4-6 weeks  OBJECTIVE:  Note: Objective measures were completed at  Evaluation unless otherwise noted.  DIAGNOSTIC FINDINGS:   NCV test some right RTC and pinched nerve in neck  PATIENT SURVEYS:  NDI 19 / 50 = 38.0 %  COGNITION: Overall cognitive status: Within functional limits for tasks assessed  SENSATION: Not tested  POSTURE: rounded shoulders  PALPATION: Left SO, brachioradialis   CERVICAL ROM: * pain  Active ROM A/PROM (deg) eval 12/22/23  Flexion full   Extension full   Right lateral flexion 28   Left lateral flexion 28*   Right rotation 50 50  Left rotation 40 47   (Blank rows = not tested)  UPPER EXTREMITY ROM: WNL   UPPER EXTREMITY MMT:  MMT Right eval Left eval Left 12/27/23  Shoulder flexion  4+ 5  Shoulder extension     Shoulder abduction  4+ 4+  Shoulder adduction     Shoulder extension  5   Shoulder internal rotation     Shoulder external rotation     Middle trapezius     Lower trapezius     Elbow flexion  4+   Elbow extension  5   Wrist flexion  5   Wrist extension  3+   Wrist ulnar deviation     Wrist radial deviation     Wrist pronation  4   Wrist supination  4   Grip strength 80 30 33   (Blank rows = not tested)  CERVICAL SPECIAL TESTS:  Upper limb tension test (ULTT): Positive, Spurling's test: Negative, and Distraction test: Positive  ulnar and radial   TREATMENT DATE:     12/29/23 UBE L2 x 4 min 2 fwd/bwd - PT present to discuss status Yellow band looped around wrists: shoulder ER x 10, OH flexion x 10 Rows green x 20 Ext blue x 20 Seated wrist ext gravity eliminated x 10, 1# arm supported on bolster Seated wrist ext against gravity on bolster 5 sec hold x 10 Seated pro/sup with 1# wt to fatigue Individual finger extension done together with R hand Standing biceps curls 2# AW + 1# wt on wrist: Palm up x 10,  palm down x 10, hammer curl x 10, shoulder flexion x 10, shoulder ABD   Mechanical Cervical static traction 23# x 10 min  12/27/23 UBE L2 x 4 min 2 fwd/bwd - PT present to discuss  status Seated wrist ext gravity eliminated  x 10, 1# arm supported on bolster Seated wrist ext against gravity on bolster 5 sec hold x 10 Seated pro/sup with 1# wt to fatigue Individual finger extension done together with R hand Biceps curls 2# AW on wrist and elbow supported on blue bolster: Palm up x 10,  palm down x 10, hammer curl x 10 using R UE to block rist  Standing shoulder flexion x 10, then  2#AW on wrist and holding 1# wt  x 10 holding 5 sec Standing shoulder ABD 2#AW on wrist  and some done with 1# wt x5 feels in rotator cuff holding 5 sec   Mechanical Cervical static traction 23# x 10 min   12/22/23 UBE L2 x 4 min 2 fwd/bwd - PT present to discuss status Seated wrist ext gravity eliminated x 10, 1# arm supported on bolster Seated wrist ext against gravity on bolster 5 sec hold 2x5 Seated pro/sup with 1# wt to fatigue Biceps curls 2# AW on wrist and elbow supported on blue bolster: Palm up x 10,  palm down x 10, hammer curl x 10 Standing shoulder flexion 2#AW on wrist and holding 1# wt 2 x 10  Standing shoulder ABD 2#AW on wrist  and some done with 1# wt 2x10 Theratube - wringing and bending to fatigue Seated wrist radial and ulnar deviation with R hand stabilizing left wrist  Mechanical Cervical static traction 23# x 10 min   12/20/23 UBE L2 x 4 min 2 fwd/bwd - PT present to discuss status Seated wrist ext gravity eliminated x 10, then with 5 sec hold x 10 Seated pro/sup with 1# wt to fatigue Biceps curls 2# AW on wrist and elbow supported on blue bolster Standing shoulder flexion 2#AW on wrist 2 x 10 ea Standing biceps curls to OH press 2# AW 2x10 Theratube - wringing and bending  Mechanical Cervical static traction 23# x 10 min  12/15/23 UBE L2 x 4 min 2 fwd/bwd - PT present to discuss status Doorway stretch at 60 deg, 120 deg,  2x 30 sec ea Seated chin tucks 5 sec hold into purple ball 2 x 10 Seated lateral flexion and rotation 5 sec hold x 5 B Radial Nerve  glides x 10 5 sec hold (see HEP) Prone on elbows cervical diagonals x 10 B elbow to over opp shoulder - no change in crunching afterwards  Mechanical Cervical static traction 23# x 10 min  12/13/23 UBE L2 x 6 min 3 fwd/bwd - PT present to discuss status Doorway stretch at 60 deg, 120 deg,  2x 30 sec ea Seated chin tucks 5 sec hold x 10, then into purple ball x 10 Standing scapular retraction x 10 on noodle --> L x 10, W x 10 Radial Nerve glides x 10 5 sec hold (see HEP) Blue Putty issued for HEP   Mechanical Cervical traction 45 sec on/15 sec off 20#/5# x 15 min   PATIENT EDUCATION:  Education details: PT eval findings, anticipated POC, initial HEP, and putty issued green med  Person educated: Patient Education method: Explanation, Demonstration, and Handouts Education comprehension: verbalized understanding and returned demonstration  HOME EXERCISE PROGRAM: Putty, weight with wrist, forearm and elbow movements Access Code: [email or web address] Access Code: [email or web address] URL: [email or web address] Date: 12/29/2023 Prepared by: Concha Deed  Exercises - Ulnar Nerve Flossing  - 2 x daily - 7 x weekly - 2 sets - 10 reps - Doorway Pec Stretch at 90 Degrees Abduction  - 1 x daily - 7 x weekly - 1  sets - 3 reps - 20-30 sec hold - Seated Cervical Retraction  - 2 x daily - 7 x weekly - 1 sets - 10 reps - 5 hold - Standing Radial Nerve Glide  - 2 x daily - 7 x weekly - 1 sets - 10 reps - 5 sec on/5 sec off hold - Shoulder External Rotation and Scapular Retraction  - 1 x daily - 7 x weekly - 2 sets - 10 reps - Seated Cervical Rotation AROM  - 1 x daily - 7 x weekly - 1 sets - 5-10 reps - 5 hold - Seated Cervical Sidebending AROM  - 1 x daily - 7 x weekly - 1 sets - 5-10 reps - 5 hold - Seated Cervical Extension AROM  - 1 x daily - 7 x weekly - 1 sets - 5-10 reps - 5 hold - Wrist AROM Flexion Extension  - 1 x daily - 7 x weekly - 2 sets - 10 reps - Seated Wrist Supination Pronation with Can  - 1 x  daily - 7 x weekly - 2 sets - 10 reps - Shoulder Abduction with Dumbbells - Palms Down  - 1 x daily - 7 x weekly - 2 sets - 10 reps - Standing Shoulder Flexion to 90 Degrees with Dumbbells  - 1 x daily - 7 x weekly - 2 sets - 10 reps - Standing Shoulder Row with Anchored Resistance  - 1 x daily - 4 x weekly - 2 sets - 10 reps - Shoulder extension with resistance - Neutral  - 1 x daily - 3 x weekly - 2 sets - 10 reps - Shoulder External Rotation and Scapular Retraction with Resistance  - 1 x daily - 3 x weekly - 2 sets - 10 reps  ASSESSMENT:  CLINICAL IMPRESSION: Good tolerance to scapular strengthening today. HEP updated with these. Strength gains are slow, but evident during exercises.    Eval:  Patient is a 58 y.o. male who was seen today for physical therapy evaluation and treatment for left cervcical radiculopathy. He has a h/o of neck issue for over 5 years. He also has been experiencing a flare up of Myasthenia Gravis affecting his vision. Patient reports pain in the neck and left UE primarily in the forearm and hand. He has numbness here as well. He has positive ULTT, ulnar > radial. He has weakness in his L UE and grip and muscles spasms in the forearm affecting ADLs including his ability to work in [employer]. He will benefit from skilled PT to address these deficits.  Trial of manual traction provided some relief during assessment, so trial of mechanical traction was done as well. No immediate relief noted.   OBJECTIVE IMPAIRMENTS: decreased activity tolerance, decreased strength, increased fascial restrictions, increased muscle spasms, impaired flexibility, impaired sensation, impaired UE functional use, postural dysfunction, and pain.   ACTIVITY LIMITATIONS: carrying, lifting, sleeping, bathing, dressing, and hygiene/grooming  PARTICIPATION LIMITATIONS: meal prep, cleaning, laundry, driving, shopping, community activity, occupation, and yard work  PERSONAL FACTORS: Past/current  experiences, Time since onset of injury/illness/exacerbation, and 1-2 comorbidities: DM and MG are also affecting patient's functional outcome.   REHAB POTENTIAL: Good  CLINICAL DECISION MAKING: Evolving/moderate complexity  EVALUATION COMPLEXITY: Moderate   GOALS: Goals reviewed with patient? Yes  SHORT TERM GOALS: Target date: 12/13/2023   Patient will be independent with initial HEP.  Baseline:  Goal status: MET 2.  Patient to report improved sensation in his L UE by 30% to aid  in lifting and carrying. Baseline:  Goal status: IN PROGRESS   3.  Decreased pain in neck and UE by 30% or more to improve sleep. Baseline:  Goal status: IN PROGRESS 20% 12/22/23  4.  Pt to demo negative ULTT showing improved neural mobility Baseline:  Goal status: IN PROGRESS  12/22/23    LONG TERM GOALS: Target date: 01/17/2024   Patient will be independent with advanced/ongoing HEP to improve outcomes and carryover.  Baseline:  Goal status: INITIAL  2.  Patient will report 75% improvement in neck pain with ADLS.  Baseline:  Goal status: INITIAL  3.  report improved sensation in his L UE by 75% to aid in lifting and carrying. Baseline:  Goal status:INITIAL  4.  Patient will demonstrate improved left grip strength to 50# or better to normalize function with lifting. Baseline:  Goal status:INITIAL  5.  Patient will report 9 or less on NDI to demonstrate improved functional ability.  Baseline: 19 Goal status: INITIAL  6.  Patient will demonstrate improved UE strength to 5/5 to normalize ADLS.   Baseline:  Goal status: INITIAL     PLAN:  PT FREQUENCY: 2x/week  PT DURATION: 8 weeks  PLANNED INTERVENTIONS: [id number]- PT Re-evaluation, 97110-Therapeutic exercises, 97530- Therapeutic activity, 97112- Neuromuscular re-education, 97535- Self Care, [id number]- Manual therapy, [id number]- Electrical stimulation (unattended), Patient/Family education, Taping, Dry Needling, Joint mobilization, Spinal  mobilization, Cryotherapy, and Moist heat  PLAN FOR NEXT SESSION:  continue with UE and grip strengthening, radial nerve glides  Jinx Mourning, PT 12/29/23 12:27 PM Phone: [phone] Fax: [phone]

## 2023-12-29 ENCOUNTER — Other Ambulatory Visit (HOSPITAL_COMMUNITY): Payer: Self-pay

## 2023-12-29 ENCOUNTER — Encounter: Payer: Self-pay | Admitting: Physical Therapy

## 2023-12-29 ENCOUNTER — Ambulatory Visit: Admitting: Physical Therapy

## 2023-12-29 DIAGNOSIS — R252 Cramp and spasm: Secondary | ICD-10-CM

## 2023-12-29 DIAGNOSIS — M5412 Radiculopathy, cervical region: Secondary | ICD-10-CM

## 2023-12-29 DIAGNOSIS — M6281 Muscle weakness (generalized): Secondary | ICD-10-CM

## 2024-01-02 NOTE — Therapy (Unsigned)
 OUTPATIENT PHYSICAL THERAPY CERVICAL TREATMENT NOTE   Patient Name: Mark Black MRN: [id number] DOB:29-Jul-1965, 58 y.o., male Today's Date: 01/03/2024  END OF SESSION:  PT End of Session - 01/03/24 1015     Visit Number 10    Date for PT Re-Evaluation 01/17/24    Authorization Type [place] [employer]ET[employer]    PT Start Time 1015    PT Stop Time 1101    PT Time Calculation (min) 46 min    Activity Tolerance Patient tolerated treatment well    Behavior During Therapy [facility] for tasks assessed/performed                  Past Medical History:  Diagnosis Date   Allergic rhinitis 12/24/2017   Allergy    Alopecia totalis    Anxiety 12/24/2017   Asthma    as a child   Chicken pox    Diabetes mellitus without complication (HCC)    Elevated liver enzymes 10/13/2020   Essential hypertension 02/10/2013   Eyelid abnormality 04/23/2021   GERD (gastroesophageal reflux disease) 01/27/2019   Hypogonadism in male 06/26/2019   Hypothyroidism 02/10/2013   Lower urinary tract symptoms (LUTS) 12/24/2017   Screening for colon cancer 12/24/2017   Suspected COVID-19 virus infection 06/20/2020   T2DM (type 2 diabetes mellitus) (HCC) 03/21/2018   Vitiligo    Past Surgical History:  Procedure Laterality Date   CARPAL TUNNEL RELEASE     [place]il   NASAL SINUS SURGERY  1994   cut and open air passages/nostrils   Patient Active Problem List   Diagnosis Date Noted   Erectile dysfunction 08/09/2023   Hemorrhoid 01/27/2023   Myasthenia gravis without acute exacerbation (HCC) 05/01/2022   Hypertriglyceridemia 10/24/2021   Allergy 10/23/2021   Alopecia totalis 10/23/2021   Asthma 10/23/2021   Chicken pox 10/23/2021   Diabetes mellitus without complication (HCC) 10/23/2021   Vitiligo 10/23/2021   Eyelid abnormality 04/23/2021   Elevated liver enzymes 10/13/2020   Suspected COVID-19 virus infection 06/20/2020   Hypogonadism in male 06/26/2019   GERD (gastroesophageal reflux disease) 01/27/2019   T2DM (type  2 diabetes mellitus) (HCC) 03/21/2018   Anxiety 12/24/2017   Allergic rhinitis 12/24/2017   Lower urinary tract symptoms (LUTS) 12/24/2017   Screening for colon cancer 12/24/2017   Essential hypertension 02/10/2013   Hypothyroidism 02/10/2013    PCP: Alvia Bring, DO   REFERRING PROVIDER: Tobie Tonita POUR, DO   REFERRING DIAG:  [id number] (ICD-10-CM) - Cervical radiculopathy  G70.01 (ICD-10-CM) - Myasthenia gravis with exacerbation (HCC)    THERAPY DIAG:  Radiculopathy, cervical region  Cramp and spasm  Muscle weakness (generalized)  Rationale for Evaluation and Treatment: Rehabilitation  ONSET DATE: 5 months ago  SUBJECTIVE:  SUBJECTIVE STATEMENT: I feel like it is getting stronger. It's still weak, but improving.  No MRI results yet.   Had MRI 12/20/23, results will take 1-2 weeks.   Started having weakness with left grip. It stays tense in the neck. He has had spasms in the left forearm as well. Forearm and hand are numb. Due to flare up of MG, he delayed seeking treatment for this. Also reports R shoulder pain.  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 Yes: NPRS scale: 0/10 Pain location: B neck left worse Pain description: constant ache Aggravating factors: nothing that he has noticed Relieving factors: rest  PRECAUTIONS: None  RED FLAGS: None     WEIGHT BEARING RESTRICTIONS: No  FALLS:  Has patient fallen in last 6 months? No  LIVING ENVIRONMENT: Lives with: lives with their family  OCCUPATION: worked at [employer] but was layed off in [employer]  PLOF: Independent  PATIENT GOALS: get my strength back in my left arm  NEXT MD VISIT: 4-6  weeks  OBJECTIVE:  Note: Objective measures were completed at Evaluation unless otherwise noted.  DIAGNOSTIC FINDINGS:   NCV test some right RTC and pinched nerve in neck  PATIENT SURVEYS:  NDI 19 / 50 = 38.0 %  COGNITION: Overall cognitive status: Within functional limits for tasks assessed  SENSATION: Not tested  POSTURE: rounded shoulders  PALPATION: Left SO, brachioradialis   CERVICAL ROM: * pain  Active ROM A/PROM (deg) eval 12/22/23  Flexion full   Extension full   Right lateral flexion 28   Left lateral flexion 28*   Right rotation 50 50  Left rotation 40 47   (Blank rows = not tested)  UPPER EXTREMITY ROM: WNL   UPPER EXTREMITY MMT:  MMT Right eval Left eval Left 12/27/23  Shoulder flexion  4+ 5  Shoulder extension     Shoulder abduction  4+ 4+  Shoulder adduction     Shoulder extension  5   Shoulder internal rotation     Shoulder external rotation     Middle trapezius     Lower trapezius     Elbow flexion  4+   Elbow extension  5   Wrist flexion  5   Wrist extension  3+   Wrist ulnar deviation     Wrist radial deviation     Wrist pronation  4   Wrist supination  4   Grip strength 80 30 33   (Blank rows = not tested)  CERVICAL SPECIAL TESTS:  Upper limb tension test (ULTT): Positive, Spurling's test: Negative, and Distraction test: Positive  ulnar and radial   TREATMENT DATE:     01/03/24 UBE L3 x 4 min 2 fwd/bwd - PT present to discuss status Yellow band looped around wrists: shoulder ER x 10, OH flexion x 10 Rows 10#KB x 10 (watch back  Carries 10# L hand 2 laps down and back hallway Lat pull for grip strength 40# x 10 seated  Seated wrist ext gravity eliminated x 10, 1# arm supported on bolster Seated wrist ext against gravity on bolster 5 sec hold x 10 Standing biceps curls 3# AW on wrist: Palm up x 10,  palm down x 10, hammer curl x 10, shoulder flexion x 10 ea palm up and palm down, shoulder ABD x 10 Seated pro/sup with 1# wt to  fatigue - poor  wrist ext control today after previous TE Towel scrunching with hand towel Individual finger flexion using rubber band  Mechanical Cervical static traction 23# x 10 min   12/29/23 UBE L2 x 4 min 2 fwd/bwd - PT present to discuss status Yellow band looped around wrists: shoulder ER x 10, OH flexion x 10 Rows green x 20 Ext blue x 20 Seated wrist ext gravity eliminated x 10, 1# arm supported on bolster Seated wrist ext against gravity on bolster 5 sec hold x 10 Seated pro/sup with 1# wt to fatigue Individual finger extension done together with R hand Standing biceps curls 2# AW + 1# wt on wrist: Palm up x 10,  palm down x 10, hammer curl x 10, shoulder flexion x 10, shoulder ABD   Mechanical Cervical static traction 23# x 10 min  12/27/23 UBE L2 x 4 min 2 fwd/bwd - PT present to discuss status Seated wrist ext gravity eliminated x 10, 1# arm supported on bolster Seated wrist ext against gravity on bolster 5 sec hold x 10 Seated pro/sup with 1# wt to fatigue Individual finger extension done together with R hand Biceps curls 2# AW on wrist and elbow supported on blue bolster: Palm up x 10,  palm down x 10, hammer curl x 10 using R UE to block rist  Standing shoulder flexion x 10, then  2#AW on wrist and holding 1# wt  x 10 holding 5 sec Standing shoulder ABD 2#AW on wrist  and some done with 1# wt x5 feels in rotator cuff holding 5 sec   Mechanical Cervical static traction 23# x 10 min  PATIENT EDUCATION:  Education details: PT eval findings, anticipated POC, initial HEP, and putty issued green med  Person educated: Patient Education method: Explanation, Demonstration, and Handouts Education comprehension: verbalized understanding and returned demonstration  HOME EXERCISE PROGRAM: Putty, weight with wrist, forearm and elbow movements Access Code: [email or web address] Access Code: [email or web address] URL: [email or web address] Date: 12/29/2023 Prepared by:  Mliss  Exercises - Ulnar Nerve Flossing  - 2 x daily - 7 x weekly - 2 sets - 10 reps - Doorway Pec Stretch at 90 Degrees Abduction  - 1 x daily - 7 x weekly - 1 sets - 3 reps - 20-30 sec hold - Seated Cervical Retraction  - 2 x daily - 7 x weekly - 1 sets - 10 reps - 5 hold - Standing Radial Nerve Glide  - 2 x daily - 7 x weekly - 1 sets - 10 reps - 5 sec on/5 sec off hold - Shoulder External Rotation and Scapular Retraction  - 1 x daily - 7 x weekly - 2 sets - 10 reps - Seated Cervical Rotation AROM  - 1 x daily - 7 x weekly - 1 sets - 5-10 reps - 5 hold - Seated Cervical Sidebending AROM  - 1 x daily - 7 x weekly - 1 sets - 5-10 reps - 5 hold - Seated Cervical Extension AROM  - 1 x daily - 7 x weekly - 1 sets - 5-10 reps - 5 hold - Wrist AROM Flexion Extension  - 1 x daily - 7 x weekly - 2 sets - 10 reps - Seated Wrist Supination Pronation with Can  - 1 x daily - 7 x weekly - 2 sets - 10 reps - Shoulder Abduction with Dumbbells - Palms Down  - 1 x daily - 7 x weekly - 2 sets - 10 reps - Standing Shoulder Flexion to 90 Degrees  with Dumbbells  - 1 x daily - 7 x weekly - 2 sets - 10 reps - Standing Shoulder Row with Anchored Resistance  - 1 x daily - 4 x weekly - 2 sets - 10 reps - Shoulder extension with resistance - Neutral  - 1 x daily - 3 x weekly - 2 sets - 10 reps - Shoulder External Rotation and Scapular Retraction with Resistance  - 1 x daily - 3 x weekly - 2 sets - 10 reps  ASSESSMENT:  CLINICAL IMPRESSION: Patient continues to report improvements in strength and mobility. He fatigues with all exercises, but overall shows improvement in ability to perform his exercises. Added carries and lat pulls to grip strength today and rubberbands for finger flexion. No change in numbness reported in forearm and he continues to have positive neural tension. He continues to demonstrate potential for improvement and would benefit from continued skilled therapy to address impairments.     Eval:   Patient is a 58 y.o. male who was seen today for physical therapy evaluation and treatment for left cervcical radiculopathy. He has a h/o of neck issue for over 5 years. He also has been experiencing a flare up of Myasthenia Gravis affecting his vision. Patient reports pain in the neck and left UE primarily in the forearm and hand. He has numbness here as well. He has positive ULTT, ulnar > radial. He has weakness in his L UE and grip and muscles spasms in the forearm affecting ADLs including his ability to work in [employer]. He will benefit from skilled PT to address these deficits.  Trial of manual traction provided some relief during assessment, so trial of mechanical traction was done as well. No immediate relief noted.   OBJECTIVE IMPAIRMENTS: decreased activity tolerance, decreased strength, increased fascial restrictions, increased muscle spasms, impaired flexibility, impaired sensation, impaired UE functional use, postural dysfunction, and pain.   ACTIVITY LIMITATIONS: carrying, lifting, sleeping, bathing, dressing, and hygiene/grooming  PARTICIPATION LIMITATIONS: meal prep, cleaning, laundry, driving, shopping, community activity, occupation, and yard work  PERSONAL FACTORS: Past/current experiences, Time since onset of injury/illness/exacerbation, and 1-2 comorbidities: DM and MG are also affecting patient's functional outcome.   REHAB POTENTIAL: Good  CLINICAL DECISION MAKING: Evolving/moderate complexity  EVALUATION COMPLEXITY: Moderate   GOALS: Goals reviewed with patient? Yes  SHORT TERM GOALS: Target date: 12/13/2023   Patient will be independent with initial HEP.  Baseline:  Goal status: MET 2.  Patient to report improved sensation in his L UE by 30% to aid in lifting and carrying. Baseline:  Goal status: IN PROGRESS   3.  Decreased pain in neck and UE by 30% or more to improve sleep. Baseline:  Goal status: IN PROGRESS 20% 12/22/23  4.  Pt to demo negative ULTT  showing improved neural mobility Baseline:  Goal status: IN PROGRESS  12/22/23    LONG TERM GOALS: Target date: 01/17/2024   Patient will be independent with advanced/ongoing HEP to improve outcomes and carryover.  Baseline:  Goal status: INITIAL  2.  Patient will report 75% improvement in neck pain with ADLS.  Baseline:  Goal status: INITIAL  3.  report improved sensation in his L UE by 75% to aid in lifting and carrying. Baseline:  Goal status:INITIAL  4.  Patient will demonstrate improved left grip strength to 50# or better to normalize function with lifting. Baseline:  Goal status:INITIAL  5.  Patient will report 9 or less on NDI to demonstrate improved functional ability.  Baseline: 19  Goal status: INITIAL  6.  Patient will demonstrate improved UE strength to 5/5 to normalize ADLS.   Baseline:  Goal status: INITIAL     PLAN:  PT FREQUENCY: 2x/week  PT DURATION: 8 weeks  PLANNED INTERVENTIONS: 97164- PT Re-evaluation, 97110-Therapeutic exercises, 97530- Therapeutic activity, 97112- Neuromuscular re-education, 97535- Self Care, [id number]- Manual therapy, [id number]- Electrical stimulation (unattended), Patient/Family education, Taping, Dry Needling, Joint mobilization, Spinal mobilization, Cryotherapy, and Moist heat  PLAN FOR NEXT SESSION:  continue with UE and grip strengthening, radial nerve glides  Mliss Cummins, PT 01/03/24 10:55 AM Phone: [phone] Fax: [phone]

## 2024-01-03 ENCOUNTER — Ambulatory Visit: Admitting: Physical Therapy

## 2024-01-03 ENCOUNTER — Encounter: Payer: Self-pay | Admitting: Physical Therapy

## 2024-01-03 DIAGNOSIS — M5412 Radiculopathy, cervical region: Secondary | ICD-10-CM | POA: Diagnosis not present

## 2024-01-03 DIAGNOSIS — R252 Cramp and spasm: Secondary | ICD-10-CM

## 2024-01-03 DIAGNOSIS — M6281 Muscle weakness (generalized): Secondary | ICD-10-CM | POA: Diagnosis not present

## 2024-01-04 ENCOUNTER — Ambulatory Visit

## 2024-01-04 NOTE — Therapy (Signed)
 OUTPATIENT PHYSICAL THERAPY CERVICAL TREATMENT NOTE   Patient Name: Mark Black MRN: [id number] DOB:Dec 24, 1965, 58 y.o., male Today's Date: 01/05/2024  END OF SESSION:  PT End of Session - 01/05/24 1019     Visit Number 11    Date for PT Re-Evaluation 01/17/24    Authorization Type [place] [employer]ET[employer]    PT Start Time 1016    PT Stop Time 1055    PT Time Calculation (min) 39 min    Activity Tolerance Patient tolerated treatment well    Behavior During Therapy [facility] for tasks assessed/performed                   Past Medical History:  Diagnosis Date   Allergic rhinitis 12/24/2017   Allergy    Alopecia totalis    Anxiety 12/24/2017   Asthma    as a child   Chicken pox    Diabetes mellitus without complication (HCC)    Elevated liver enzymes 10/13/2020   Essential hypertension 02/10/2013   Eyelid abnormality 04/23/2021   GERD (gastroesophageal reflux disease) 01/27/2019   Hypogonadism in male 06/26/2019   Hypothyroidism 02/10/2013   Lower urinary tract symptoms (LUTS) 12/24/2017   Screening for colon cancer 12/24/2017   Suspected COVID-19 virus infection 06/20/2020   T2DM (type 2 diabetes mellitus) (HCC) 03/21/2018   Vitiligo    Past Surgical History:  Procedure Laterality Date   CARPAL TUNNEL RELEASE     Bil   NASAL SINUS SURGERY  1994   cut and open air passages/nostrils   Patient Active Problem List   Diagnosis Date Noted   Erectile dysfunction 08/09/2023   Hemorrhoid 01/27/2023   Myasthenia gravis without acute exacerbation (HCC) 05/01/2022   Hypertriglyceridemia 10/24/2021   Allergy 10/23/2021   Alopecia totalis 10/23/2021   Asthma 10/23/2021   Chicken pox 10/23/2021   Diabetes mellitus without complication (HCC) 10/23/2021   Vitiligo 10/23/2021   Eyelid abnormality 04/23/2021   Elevated liver enzymes 10/13/2020   Suspected COVID-19 virus infection 06/20/2020   Hypogonadism in male 06/26/2019   GERD (gastroesophageal reflux disease) 01/27/2019   T2DM  (type 2 diabetes mellitus) (HCC) 03/21/2018   Anxiety 12/24/2017   Allergic rhinitis 12/24/2017   Lower urinary tract symptoms (LUTS) 12/24/2017   Screening for colon cancer 12/24/2017   Essential hypertension 02/10/2013   Hypothyroidism 02/10/2013    PCP: Alvia Bring, DO   REFERRING PROVIDER: Tobie Tonita POUR, DO   REFERRING DIAG:  [id number] (ICD-10-CM) - Cervical radiculopathy  G70.01 (ICD-10-CM) - Myasthenia gravis with exacerbation (HCC)    THERAPY DIAG:  Radiculopathy, cervical region  Cramp and spasm  Muscle weakness (generalized)  Rationale for Evaluation and Treatment: Rehabilitation  ONSET DATE: 5 months ago  SUBJECTIVE:  SUBJECTIVE STATEMENT: Still no results from MRI.  No MRI results yet.   Had MRI 12/20/23, results will take 1-2 weeks.   Started having weakness with left grip. It stays tense in the neck. He has had spasms in the left forearm as well. Forearm and hand are numb. Due to flare up of MG, he delayed seeking treatment for this. Also reports R shoulder pain.  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 Yes: NPRS scale: 0/10 Pain location: B neck left worse Pain description: constant ache Aggravating factors: nothing that he has noticed Relieving factors: rest  PRECAUTIONS: None  RED FLAGS: None     WEIGHT BEARING RESTRICTIONS: No  FALLS:  Has patient fallen in last 6 months? No  LIVING ENVIRONMENT: Lives with: lives with their family  OCCUPATION: worked at [employer] but was layed off in [employer]  PLOF: Independent  PATIENT GOALS: get my strength back in my left arm  NEXT MD VISIT: 4-6 weeks  OBJECTIVE:  Note: Objective  measures were completed at Evaluation unless otherwise noted.  DIAGNOSTIC FINDINGS:   NCV test some right RTC and pinched nerve in neck  PATIENT SURVEYS:  NDI 19 / 50 = 38.0 %  COGNITION: Overall cognitive status: Within functional limits for tasks assessed  SENSATION: Not tested  POSTURE: rounded shoulders  PALPATION: Left SO, brachioradialis   CERVICAL ROM: * pain  Active ROM A/PROM (deg) eval 12/22/23  Flexion full   Extension full   Right lateral flexion 28   Left lateral flexion 28*   Right rotation 50 50  Left rotation 40 47   (Blank rows = not tested)  UPPER EXTREMITY ROM: WNL   UPPER EXTREMITY MMT:  MMT Right eval Left eval Left 12/27/23 Left  01/05/24  Shoulder flexion  4+ 5 5  Shoulder extension      Shoulder abduction  4+ 4+ 4+  Shoulder adduction      Shoulder extension  5    Shoulder internal rotation      Shoulder external rotation      Middle trapezius      Lower trapezius      Elbow flexion  4+  5  Elbow extension  5    Wrist flexion  5    Wrist extension  3+  3+  Wrist ulnar deviation      Wrist radial deviation      Wrist pronation  4  5  Wrist supination  4  5  Grip strength 80 30 33 43   (Blank rows = not tested)  CERVICAL SPECIAL TESTS:  Upper limb tension test (ULTT): Positive, Spurling's test: Negative, and Distraction test: Positive  ulnar and radial   TREATMENT DATE:     01/05/24 UBE L3 x 4 min 2 fwd/bwd - PT present to discuss status Goals assessed, MMT Radial nerve glide standing and supine Rowing machine 55# x 10 Triceps and shoulder ext 20# x 10 Supine pec stretch in goal post position and hand behind head (added to HEP0  Manual: STM and IASTM with blade and ball to L pectoralis muscles    01/03/24 UBE L3 x 4 min 2 fwd/bwd - PT present to discuss status Yellow band looped around wrists: shoulder ER x 10, OH flexion x 10 Rows 10#KB x 10 (watch back  Carries 10# L hand 2 laps down and back  hallway Lat pull for  grip strength 40# x 10 seated  Seated wrist ext gravity eliminated x 10, 1# arm supported on bolster Seated wrist ext against gravity on bolster 5 sec hold x 10 Standing biceps curls 3# AW on wrist: Palm up x 10,  palm down x 10, hammer curl x 10, shoulder flexion x 10 ea palm up and palm down, shoulder ABD x 10 Seated pro/sup with 1# wt to fatigue - poor wrist ext control today after previous TE Towel scrunching with hand towel Individual finger flexion using rubber band  Mechanical Cervical static traction 23# x 10 min   12/29/23 UBE L2 x 4 min 2 fwd/bwd - PT present to discuss status Yellow band looped around wrists: shoulder ER x 10, OH flexion x 10 Rows green x 20 Ext blue x 20 Seated wrist ext gravity eliminated x 10, 1# arm supported on bolster Seated wrist ext against gravity on bolster 5 sec hold x 10 Seated pro/sup with 1# wt to fatigue Individual finger extension done together with R hand Standing biceps curls 2# AW + 1# wt on wrist: Palm up x 10,  palm down x 10, hammer curl x 10, shoulder flexion x 10, shoulder ABD   Mechanical Cervical static traction 23# x 10 min  12/27/23 UBE L2 x 4 min 2 fwd/bwd - PT present to discuss status Seated wrist ext gravity eliminated x 10, 1# arm supported on bolster Seated wrist ext against gravity on bolster 5 sec hold x 10 Seated pro/sup with 1# wt to fatigue Individual finger extension done together with R hand Biceps curls 2# AW on wrist and elbow supported on blue bolster: Palm up x 10,  palm down x 10, hammer curl x 10 using R UE to block rist  Standing shoulder flexion x 10, then  2#AW on wrist and holding 1# wt  x 10 holding 5 sec Standing shoulder ABD 2#AW on wrist  and some done with 1# wt x5 feels in rotator cuff holding 5 sec   Mechanical Cervical static traction 23# x 10 min  PATIENT EDUCATION:  Education details: PT eval findings, anticipated POC, initial HEP, and putty issued green med  Person educated:  Patient Education method: Explanation, Demonstration, and Handouts Education comprehension: verbalized understanding and returned demonstration  HOME EXERCISE PROGRAM: Putty, weight with wrist, forearm and elbow movements Access Code: [email or web address] Access Code: [email or web address] URL: [email or web address] Date: 12/29/2023 Prepared by: Mliss  Exercises - Ulnar Nerve Flossing  - 2 x daily - 7 x weekly - 2 sets - 10 reps - Doorway Pec Stretch at 90 Degrees Abduction  - 1 x daily - 7 x weekly - 1 sets - 3 reps - 20-30 sec hold - Seated Cervical Retraction  - 2 x daily - 7 x weekly - 1 sets - 10 reps - 5 hold - Standing Radial Nerve Glide  - 2 x daily - 7 x weekly - 1 sets - 10 reps - 5 sec on/5 sec off hold - Shoulder External Rotation and Scapular Retraction  - 1 x daily - 7 x weekly - 2 sets - 10 reps - Seated Cervical Rotation AROM  - 1 x daily - 7 x weekly - 1 sets - 5-10 reps - 5 hold - Seated Cervical Sidebending AROM  - 1 x daily - 7 x weekly - 1 sets - 5-10 reps - 5 hold - Seated Cervical Extension AROM  - 1 x daily - 7 x weekly - 1  sets - 5-10 reps - 5 hold - Wrist AROM Flexion Extension  - 1 x daily - 7 x weekly - 2 sets - 10 reps - Seated Wrist Supination Pronation with Can  - 1 x daily - 7 x weekly - 2 sets - 10 reps - Shoulder Abduction with Dumbbells - Palms Down  - 1 x daily - 7 x weekly - 2 sets - 10 reps - Standing Shoulder Flexion to 90 Degrees with Dumbbells  - 1 x daily - 7 x weekly - 2 sets - 10 reps - Standing Shoulder Row with Anchored Resistance  - 1 x daily - 4 x weekly - 2 sets - 10 reps - Shoulder extension with resistance - Neutral  - 1 x daily - 3 x weekly - 2 sets - 10 reps - Shoulder External Rotation and Scapular Retraction with Resistance  - 1 x daily - 3 x weekly - 2 sets - 10 reps  ASSESSMENT:  CLINICAL IMPRESSION: Patient shows improvement in grip strength, UE strength and functional strength, but continues to have marked weakness in wrist  and finger  extension. He also has ongoing radial nerve tension. He responded well to supine nerve glides today and STM to his pecs which were very tight. He would like to hold PT at this time until he sees his MD and gets his MRI results. He has gained enough strength that he can return to some exercises at the gym. Some of these were reviewed today for tolerance. He continues to demonstrate potential for improvement and would benefit from continued skilled therapy to address impairments, however, he will be put on hold per his request.    Eval:  Patient is a 58 y.o. male who was seen today for physical therapy evaluation and treatment for left cervcical radiculopathy. He has a h/o of neck issue for over 5 years. He also has been experiencing a flare up of Myasthenia Gravis affecting his vision. Patient reports pain in the neck and left UE primarily in the forearm and hand. He has numbness here as well. He has positive ULTT, ulnar > radial. He has weakness in his L UE and grip and muscles spasms in the forearm affecting ADLs including his ability to work in [employer]. He will benefit from skilled PT to address these deficits.  Trial of manual traction provided some relief during assessment, so trial of mechanical traction was done as well. No immediate relief noted.   OBJECTIVE IMPAIRMENTS: decreased activity tolerance, decreased strength, increased fascial restrictions, increased muscle spasms, impaired flexibility, impaired sensation, impaired UE functional use, postural dysfunction, and pain.   ACTIVITY LIMITATIONS: carrying, lifting, sleeping, bathing, dressing, and hygiene/grooming  PARTICIPATION LIMITATIONS: meal prep, cleaning, laundry, driving, shopping, community activity, occupation, and yard work  PERSONAL FACTORS: Past/current experiences, Time since onset of injury/illness/exacerbation, and 1-2 comorbidities: DM and MG are also affecting patient's functional outcome.   REHAB POTENTIAL:  Good  CLINICAL DECISION MAKING: Evolving/moderate complexity  EVALUATION COMPLEXITY: Moderate   GOALS: Goals reviewed with patient? Yes  SHORT TERM GOALS: Target date: 12/13/2023   Patient will be independent with initial HEP.  Baseline:  Goal status: MET 2.  Patient to report improved sensation in his L UE by 30% to aid in lifting and carrying. Baseline:  Goal status: IN PROGRESS   3.  Decreased pain in neck and UE by 30% or more to improve sleep. Baseline:  Goal status: IN PROGRESS 20% 12/22/23  4.  Pt to demo negative ULTT  showing improved neural mobility Baseline:  Goal status: IN PROGRESS  12/22/23    LONG TERM GOALS: Target date: 01/17/2024   Patient will be independent with advanced/ongoing HEP to improve outcomes and carryover.  Baseline:  Goal status: MET for current HEP  2.  Patient will report 75% improvement in neck pain with ADLS.  Baseline:  Goal status: IN PROGRESS 50-70% better  3.  report improved sensation in his L UE by 75% to aid in lifting and carrying. Baseline:  Goal status:IN PROGRESS - no change 01/05/24  4.  Patient will demonstrate improved left grip strength to 50# or better to normalize function with lifting. Baseline:  Goal status:IN PROGRESS 6/25 43#  5.  Patient will report 9 or less on NDI to demonstrate improved functional ability.  Baseline: 19 Goal status: IN PROGRESS  6.  Patient will demonstrate improved UE strength to 5/5 to normalize ADLS.   Baseline:  Goal status: PARTIALLY MET 01/05/24 see objective chart    PLAN:  PT FREQUENCY: 2x/week  PT DURATION: 8 weeks  PLANNED INTERVENTIONS: 97164- PT Re-evaluation, 97110-Therapeutic exercises, 97530- Therapeutic activity, 97112- Neuromuscular re-education, 97535- Self Care, [id number]- Manual therapy, [id number]- Electrical stimulation (unattended), Patient/Family education, Taping, Dry Needling, Joint mobilization, Spinal mobilization, Cryotherapy, and Moist heat  PLAN FOR NEXT SESSION:  Patient on hold until f/u with MD and MRI results returned.  Mliss Cummins, PT 01/05/24 12:14 PM Phone: [phone] Fax: [phone]

## 2024-01-05 ENCOUNTER — Ambulatory Visit: Admitting: Physical Therapy

## 2024-01-05 ENCOUNTER — Ambulatory Visit (INDEPENDENT_AMBULATORY_CARE_PROVIDER_SITE_OTHER)

## 2024-01-05 ENCOUNTER — Encounter: Payer: Self-pay | Admitting: Physical Therapy

## 2024-01-05 VITALS — BP 132/85 | HR 90 | Temp 98.7°F | Resp 16 | Ht 71.0 in | Wt 186.4 lb

## 2024-01-05 DIAGNOSIS — M6281 Muscle weakness (generalized): Secondary | ICD-10-CM

## 2024-01-05 DIAGNOSIS — R252 Cramp and spasm: Secondary | ICD-10-CM

## 2024-01-05 DIAGNOSIS — M5412 Radiculopathy, cervical region: Secondary | ICD-10-CM

## 2024-01-05 DIAGNOSIS — G7000 Myasthenia gravis without (acute) exacerbation: Secondary | ICD-10-CM | POA: Diagnosis not present

## 2024-01-05 MED ORDER — EFGARTIGIMOD ALFA-HYALUR-QVFC 180-2000 MG-UNIT/ML ~~LOC~~ SOLN
1008.0000 mg | Freq: Once | SUBCUTANEOUS | Status: AC
  Administered 2024-01-05: 1008 mg via SUBCUTANEOUS
  Filled 2024-01-05: qty 5.6

## 2024-01-05 NOTE — Progress Notes (Signed)
 Diagnosis: Myasthenia Gravis  Provider:  Mannam, Praveen MD  Procedure: Injection  Vyvgart  , Dose: 1008mg , Site: subcutaneous, Number of injections: 1  Injection Site(s): Left lower quad. abdomen  Post Care: Observation period completed  Discharge: Condition: Good, Destination: Home . AVS Declined  Performed by:  Donny Childes, RN

## 2024-01-10 ENCOUNTER — Ambulatory Visit: Payer: Self-pay | Admitting: Neurology

## 2024-01-10 ENCOUNTER — Encounter: Payer: Self-pay | Admitting: Neurology

## 2024-01-10 DIAGNOSIS — M5412 Radiculopathy, cervical region: Secondary | ICD-10-CM

## 2024-01-10 DIAGNOSIS — R29898 Other symptoms and signs involving the musculoskeletal system: Secondary | ICD-10-CM

## 2024-01-11 ENCOUNTER — Other Ambulatory Visit: Payer: Self-pay | Admitting: Neurology

## 2024-01-11 ENCOUNTER — Other Ambulatory Visit: Payer: Self-pay

## 2024-01-11 DIAGNOSIS — M4802 Spinal stenosis, cervical region: Secondary | ICD-10-CM

## 2024-01-11 DIAGNOSIS — R29898 Other symptoms and signs involving the musculoskeletal system: Secondary | ICD-10-CM

## 2024-01-11 DIAGNOSIS — M4722 Other spondylosis with radiculopathy, cervical region: Secondary | ICD-10-CM

## 2024-01-11 DIAGNOSIS — M5412 Radiculopathy, cervical region: Secondary | ICD-10-CM

## 2024-01-11 NOTE — Telephone Encounter (Signed)
 Dr. Tobie, We will enter a new order and have patient scheduled. 4wks on, 4wks off. @[facility] , plz enter a new treatment plan so it will refect 4wks on 4wks off.  Thanks Luke

## 2024-01-28 NOTE — Telephone Encounter (Signed)
 Dr. Tobie, His treatment plan has been updated and the auth is pending.  Once approved the patient will be scheduled for his next cycle. Thanks Luke

## 2024-02-02 ENCOUNTER — Ambulatory Visit (INDEPENDENT_AMBULATORY_CARE_PROVIDER_SITE_OTHER): Admitting: Neurology

## 2024-02-02 ENCOUNTER — Encounter: Payer: Self-pay | Admitting: Neurology

## 2024-02-02 ENCOUNTER — Other Ambulatory Visit (HOSPITAL_COMMUNITY): Payer: Self-pay

## 2024-02-02 VITALS — BP 129/84 | HR 107 | Ht 71.0 in | Wt 187.0 lb

## 2024-02-02 DIAGNOSIS — M4722 Other spondylosis with radiculopathy, cervical region: Secondary | ICD-10-CM | POA: Diagnosis not present

## 2024-02-02 DIAGNOSIS — G7001 Myasthenia gravis with (acute) exacerbation: Secondary | ICD-10-CM

## 2024-02-02 DIAGNOSIS — R29898 Other symptoms and signs involving the musculoskeletal system: Secondary | ICD-10-CM | POA: Diagnosis not present

## 2024-02-02 DIAGNOSIS — M4802 Spinal stenosis, cervical region: Secondary | ICD-10-CM | POA: Diagnosis not present

## 2024-02-02 NOTE — Progress Notes (Signed)
 Follow-up Visit   Date: 02/02/24   Mark Black MRN: [id number] DOB: 26-Nov-1965   Interim History: Mark Black is a 58 y.o. 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  The patient was accompanied to the clinic by wife.   IMPRESSION/PLAN: Seropositive myasthenia gravis with exacerbation, thymoma negative (2023). MGFA 8.  He has responded to Vyvgart  somewhat, such that his ptosis and diplopia is not as severe. He was initially on prednisone  30mg  did not respond, so Vyvgart  Hytrulo was started in May 2024 and responded.   During the fall 2024, prednisone  was tapered down to 10mg  daily and he began to have an exacerbation.  Symptoms did not improve with higher dose of prednisone , so we attempted to restart Vyvgart  Hytrulo, however, insurance denied this.   January 2025, he received IVIG which provided mild improvement.  He continues to have moderate bulbar symptoms.   He restarted Vyvgart  Hytrulo in April 2025 and again in June 2025.    2.  Cervical canal stenosis at C4-5 through C6-7 with multilevel foraminal stenosis.  MRI was personally viewed with patient and discussed. He has left hand numbness and arm weakness.  His wrist drop has improved with PT, but strength remains 4/5 in the hand. NCS/EMG shows bilateral C7 radiculopathy.  No peripheral nerve entrapment.   PLAN/RECOMMENDATIONS:  Continue vyvgart  Hytrulo injections.  Awaiting insurance approval for 4th cycle. Will plan to continue 4 weeks on and 4 weeks off.  Continue prednisone  20mg  daily Referral to Neurosurgery for cervical canal stenosis  Return to clinic in 2 months  ------------------------------------------- History of present illness: Starting around the fall of 2022, he began having intermittent droopiness of the left eyelid.  It can  occur several days of the week and when present, it will be constant all day.  Rest does not seem to make any improvement, as he can wake up with it.  No double vision, difficulty swallowing/talking, jaw fatigue, or limb weakness.  He was seen by his ophthalmologist who referred him for evaluation of myasthenia gravis.    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    UPDATE 11/25/2022:  He was last seen in March at which time MG was very well controlled on prednisone  10mg  daily.  Unfortunately, over the past month, he reports generalized weakness, worsening double vision and droopiness of the eyelids.  His eyelids get very droopy, especially on the left where it can remain completely closed.  His right eyelid is not as severe. Double vision improved with closing one eye.   He has jaw fatigue and intermittent difficulty with swallowing.  No shortness of breath or slurred speech.  He increased prednisone  to 20mg  daily but did not appreciate any benefit.  His HbA1c is 10 and is his currently not monitoring blood glucose.  He was recently started on Ozepmic.  No recent illnesses.   UPDATE 06/15/2023:  He was last seen in May and missed prior follow-up visit.  He reports after starting Vyvgart  Hytrulo his symptoms significantly improved.  He was on prednisone  15mg  and when he tapered down to 10mg  every other day, his symptoms returned.  Since October, he has been on prednisone  20mg  daily without any benefit.  He continues to have severe droopiness and double vision.  He  has some weakness in the arms and legs.  No difficulty with swallowing/speech. He is not taking mestinon  due to GI side effects.   UPDATE 08/25/2023:  He is here for follow-up visit.  He received IVIG in January and started to see some improvement about two week later, but symptoms did not completely get better.  He continues to have  droopiness of the eyelids daily, which does not completely close, but will get to where it covers half of his eye at times.  He is able to drive again.  He has not noticed any benefit on prednisone  20mg  and reports diabetes is getting worse with last HbA1c 8.8.  They would like to consider lowering the dose.   For the past two months, he also complains of left hand weakness and numbness.  He has dropped things occasionally.    UPDATE 09/29/2023:  He is here for follow-up visit.  He continues to have droopiness of the eyelids, which fluctuate.  He had double vision a few weeks ago.  He had IVIG on 3/5 and reports that eyelids were not as droopy, but this only lasted a few days and then went back to being droopy all the time.  He is on prednisone  10mg  daily.    UPDATE 11/10/2023:  He is here for follow-up visit.  He completed series of Vyvgart  Hytrulo on 4/16, and reports a few days of improved symptoms, but this was not lasting.  He continues to have constant double vision and ptosis.  He is also getting much more tired and unable to be active as previously.  He has not been to the gym in a month.  Arms and legs feel tired.  He has noticed skin can bruise easily.     UPDATE 12/15/2023:  He is here for follow-up visit.  Unfortunately, there has not been any significant change since his last visit.  He continues to have constant double vision and ptosis, worse on the right eye.  He is also having worsening left hand weakness and unable to hold objects or spread his fingers apart.  He has started PT, but not noticed a marked improvement.   UPDATE 02/02/2024:  Since his last series of Vyvgart  Hytrulo injection, his ptosis and double has slightly improved.  He has worsening ptosis when looking laterally and double vision when looking down.  If he is looking straight ahead, symptoms are better.  No problems with speech/swallow.   He has been having shooting pain that radiates into the right thumb, which is new.   Initially, he was having only numbness/tingling into the left arm. He continues to have left hand weakness, which has improved somewhat a with PT.  He needs to use his right hand to support his left hand when lifting.   Medications:  Current Outpatient Medications on File Prior to Visit  Medication Sig Dispense Refill   amLODipine -benazepril  (LOTREL ) 10-40 MG capsule TAKE 1 CAPSULE BY MOUTH ONCE DAILY 90 capsule 1   buPROPion  (WELLBUTRIN  XL) 150 MG 24 hr tablet Take 1 tablet (150 mg total) by mouth daily. 90 tablet 1   calcium  carbonate (OS-CAL) 600 MG TABS tablet Take 1 tablet (600 mg total) by mouth 2 (two) times daily with a meal. 60 tablet 5   Continuous Glucose Sensor (FREESTYLE LIBRE 3 SENSOR) MISC Please apply for 14 days and then switch to new sensor 2 each 3   dapagliflozin  propanediol (FARXIGA ) 5 MG TABS tablet Take 1 tablet (5 mg total) by mouth  daily. 90 tablet 1   desvenlafaxine  (PRISTIQ ) 100 MG 24 hr tablet Take 1 tablet (100 mg total) by mouth daily. 90 tablet 1   levothyroxine  (SYNTHROID ) 175 MCG tablet Take 1 tablet (175 mcg total) by mouth daily before breakfast. 90 tablet 0   metFORMIN  (GLUCOPHAGE -XR) 500 MG 24 hr tablet Take 3 tablets (1,500 mg total) by mouth daily with breakfast. 270 tablet 1   Multiple Vitamins-Minerals (MULTIVITAMIN ADULT PO) Take 1 tablet by mouth daily.     pantoprazole  (PROTONIX ) 40 MG tablet Take 1 tablet (40 mg total) by mouth daily. 90 tablet 2   predniSONE  (DELTASONE ) 10 MG tablet Take 2 tablets (20 mg total) by mouth daily. 120 tablet 1   tadalafil  (CIALIS ) 10 MG tablet Take 1-2 tablets (10-20 mg total) by mouth every other day as needed for erectile dysfunction. 15 tablet 1   tamsulosin  (FLOMAX ) 0.4 MG CAPS capsule Take 1 capsule (0.4 mg total) by mouth daily. 90 capsule 3   tirzepatide  (MOUNJARO ) 7.5 MG/0.5ML Pen Inject 7.5 mg into the skin once a week. 6 mL 0   Current Facility-Administered Medications on File Prior to Visit  Medication Dose  Route Frequency Provider Last Rate Last Admin   0.9 %  sodium chloride  infusion  500 mL Intravenous Once Armbruster, Elspeth SQUIBB, MD        Allergies: No Known Allergies  Vital Signs:  BP 129/84   Pulse (!) 107   Ht 5' 11 (1.803 m)   Wt 187 lb (84.8 kg)   SpO2 98%   BMI 26.08 kg/m    Neurological Exam: MENTAL STATUS including orientation to time, place, person, recent and remote memory, attention span and concentration, language, and fund of knowledge is normal.  Speech is not dysarthric.  CRANIAL NERVES:   Left eye is blind.  Extraocular muscles are intact.  Moderate-to-severe right ptosis, mild left ptosis, significantly worse with sustained upgaze.  Face is symmetric. Palate elevates symmetrically.  Orbicularis oculi and buccinator is 4/5.  MOTOR:  No atrophy, fasciculations or abnormal movements.  No pronator drift.   Upper Extremity:  Right  Left  Deltoid  5/5   5/5   Biceps  5/5   5/5   Triceps  5/5   5/5   Wrist extensors  5/5   4/5   Wrist flexors  5/5   5/5   Finger extensors  5/5   4/5   Finger flexors  5/5   5/5   Dorsal interossei  5/5   4/5   Abductor pollicis  5/5   5/5   Tone (Ashworth scale)  0  0   Lower Extremity:  Right  Left  Hip flexors  5/5   5/5   Dorsiflexors  5/5   5/5   Tone (Ashworth scale)  0  0     MSRs:                                           Right        Left brachioradialis 2+  2+  biceps 2+  2+  triceps 2+  3+  patellar 3+  3+  ankle jerk 2+  2+   SENSATION:  Reduced temperature on the left hand, vibration intact throughout.   COORDINATION/GAIT:   Gait narrow based and stable.   Data: Myasthenia gravis panel 08/13/2021:  AChR blocking 40*, modulating 79*  Lab Results  Component Value Date   HGBA1C 5.6 11/25/2023   CT chest wwo contrast 09/12/2021:  Negative for thymoma  NCS/EMG of the right arm 09/30/2023: Chronic C7 radiculopathy affecting bilateral upper extremities, mild. Right median neuropathy at or distal to the wrist,  consistent with a clinical diagnosis of carpal tunnel syndrome.  Overall, these findings are very mild in degree electrically.  MRI cervical spine wo 01/08/2024: 1. Multilevel cervical spondylosis with resultant mild diffuse spinal stenosis at C4-5 through C6-7. 2. Multifactorial degenerative changes with resultant multilevel foraminal narrowing as above. Notable findings include moderate left C5 and right C6 foraminal stenosis, with severe right and moderate left C7 foraminal narrowing.   Thank you for allowing me to participate in patient's care.  If I can answer any additional questions, I would be pleased to do so.    Sincerely,    Luann Aspinwall K. Tobie, DO

## 2024-02-02 NOTE — Patient Instructions (Signed)
 Continue prednisone  20mg   Hopefully insurance will approve Vyvgart  Hytrulo soon so you can get scheduled.  We have resent referral to [facility]

## 2024-02-03 ENCOUNTER — Telehealth: Payer: Self-pay

## 2024-02-03 NOTE — Telephone Encounter (Signed)
 Called [facility] to check if a referral was received. Was directed by front staff to the provider line to leave a message. Left a message for a call back to follow up on referral.

## 2024-02-04 NOTE — Telephone Encounter (Signed)
 Per patient on Mychart he has not heard back from [facility]. I have re-faxed referral x2 more times to get patient scheduled. Will update once we hear something

## 2024-02-08 DIAGNOSIS — Z6827 Body mass index (BMI) 27.0-27.9, adult: Secondary | ICD-10-CM | POA: Diagnosis not present

## 2024-02-08 DIAGNOSIS — R29898 Other symptoms and signs involving the musculoskeletal system: Secondary | ICD-10-CM | POA: Diagnosis not present

## 2024-02-08 DIAGNOSIS — M4802 Spinal stenosis, cervical region: Secondary | ICD-10-CM | POA: Diagnosis not present

## 2024-02-09 NOTE — Telephone Encounter (Signed)
 Called [employer] Pre Cert line at [phone]. Rjdz#[id number]. Informed by Carole that Vyvgart  has been APPROVED. Approval#:[id number] valid from 02/04/2024-02/03/2025. Approval letter will be faxed to our office. Fax number provided.

## 2024-02-10 ENCOUNTER — Telehealth: Payer: Self-pay

## 2024-02-10 NOTE — Telephone Encounter (Signed)
Approval letter has been faxed.

## 2024-02-10 NOTE — Telephone Encounter (Signed)
 Auth Submission: APPROVED Site of care: Site of care: [facility] Payer: [employer] commercial Medication & CPT/J Code(s) submitted: Vyvgart  (Efgartigimod alfa ) [id number] Diagnosis Code:  Route of submission (phone, fax, portal): phone Phone #  Fax # Auth type: Buy/Bill PB Units/visits requested: 1008mg  x 4 doses Reference number: [id number] Approval from: 02/04/24 to 02/03/25

## 2024-02-13 ENCOUNTER — Other Ambulatory Visit: Payer: Self-pay | Admitting: Family Medicine

## 2024-02-13 DIAGNOSIS — E039 Hypothyroidism, unspecified: Secondary | ICD-10-CM

## 2024-02-13 DIAGNOSIS — E119 Type 2 diabetes mellitus without complications: Secondary | ICD-10-CM

## 2024-02-14 ENCOUNTER — Other Ambulatory Visit (HOSPITAL_COMMUNITY): Payer: Self-pay

## 2024-02-14 ENCOUNTER — Other Ambulatory Visit (HOSPITAL_BASED_OUTPATIENT_CLINIC_OR_DEPARTMENT_OTHER): Payer: Self-pay

## 2024-02-14 ENCOUNTER — Other Ambulatory Visit: Payer: Self-pay

## 2024-02-14 MED ORDER — DAPAGLIFLOZIN PROPANEDIOL 5 MG PO TABS
5.0000 mg | ORAL_TABLET | Freq: Every day | ORAL | 1 refills | Status: AC
  Filled 2024-02-14: qty 90, 90d supply, fill #0
  Filled 2024-05-25: qty 90, 90d supply, fill #1

## 2024-02-14 MED ORDER — MOUNJARO 7.5 MG/0.5ML ~~LOC~~ SOAJ
7.5000 mg | SUBCUTANEOUS | 0 refills | Status: DC
  Filled 2024-02-14 – 2024-04-04 (×2): qty 6, 84d supply, fill #0

## 2024-02-14 MED ORDER — LEVOTHYROXINE SODIUM 175 MCG PO TABS
175.0000 ug | ORAL_TABLET | Freq: Every day | ORAL | 0 refills | Status: DC
  Filled 2024-02-14: qty 90, 90d supply, fill #0

## 2024-02-14 MED ORDER — METFORMIN HCL ER 500 MG PO TB24
1500.0000 mg | ORAL_TABLET | Freq: Every day | ORAL | 1 refills | Status: AC
  Filled 2024-02-14: qty 270, 90d supply, fill #0
  Filled 2024-06-01: qty 270, 90d supply, fill #1

## 2024-02-15 ENCOUNTER — Ambulatory Visit

## 2024-02-15 ENCOUNTER — Other Ambulatory Visit (HOSPITAL_COMMUNITY): Payer: Self-pay

## 2024-02-15 VITALS — BP 129/75 | HR 91 | Temp 98.3°F | Resp 12 | Ht 71.0 in

## 2024-02-15 DIAGNOSIS — G7000 Myasthenia gravis without (acute) exacerbation: Secondary | ICD-10-CM | POA: Diagnosis not present

## 2024-02-15 MED ORDER — EFGARTIGIMOD ALFA-HYALUR-QVFC 180-2000 MG-UNIT/ML ~~LOC~~ SOLN
1008.0000 mg | Freq: Once | SUBCUTANEOUS | Status: AC
Start: 2024-02-15 — End: 2024-02-15
  Administered 2024-02-15: 1008 mg via SUBCUTANEOUS

## 2024-02-15 NOTE — Progress Notes (Signed)
 Diagnosis: Myasthenia Gravis  Provider:  Mannam, Praveen MD  Procedure: Injection  efgartigimod alfa -Hyalur-qvfc (VYVGART  HYTRULO) , Dose: 1008 mg, Site: subcutaneous, Number of injections: 1  Injection Site(s): Right lower quad. abdomne  Post Care: Observation period completed  Discharge: Condition: Stable, Destination: Home . AVS Declined  Performed by:  Lendel Quant, RN

## 2024-02-22 ENCOUNTER — Ambulatory Visit

## 2024-02-22 VITALS — BP 124/80 | HR 88 | Temp 98.0°F | Resp 18 | Ht 70.0 in | Wt 189.0 lb

## 2024-02-22 DIAGNOSIS — G7000 Myasthenia gravis without (acute) exacerbation: Secondary | ICD-10-CM

## 2024-02-22 MED ORDER — EFGARTIGIMOD ALFA-HYALUR-QVFC 180-2000 MG-UNIT/ML ~~LOC~~ SOLN
1008.0000 mg | Freq: Once | SUBCUTANEOUS | Status: AC
Start: 2024-02-22 — End: 2024-02-22
  Administered 2024-02-22 (×2): 1008 mg via SUBCUTANEOUS
  Filled 2024-02-22: qty 5.6

## 2024-02-22 NOTE — Progress Notes (Signed)
 Diagnosis: Myasthenia Gravis  Provider:  Mannam, Praveen MD  Procedure: Injection  Vyvgart Hytrulo, Dose: 1008mg , Site: subcutaneous, Number of injections: 1  Injection Site(s): Left lower quad. abdomen  Post Care: Observation period completed  Discharge: Condition: Good, Destination: Home . AVS Declined  Performed by:  Australia Droll, RN

## 2024-02-22 NOTE — Progress Notes (Signed)
 Diagnosis: Myasthenia Gravis  Provider:  Mannam, Praveen MD  Procedure: Injection  Vyvgart  Hytrulo, Dose: 1008mg , Site: subcutaneous, Number of injections: 1  Injection Site(s): Left lower quad. abdomen  Post Care: Observation period completed  Discharge: Condition: Good, Destination: Home . AVS Declined  Performed by:  Jacinto Keil, RN

## 2024-02-25 NOTE — Addendum Note (Signed)
 Addended by: Layden Caterino K on: 02/25/2024 12:48 PM   Modules accepted: Orders

## 2024-02-29 ENCOUNTER — Ambulatory Visit (INDEPENDENT_AMBULATORY_CARE_PROVIDER_SITE_OTHER)

## 2024-02-29 VITALS — BP 135/86 | HR 82 | Temp 97.8°F | Resp 14 | Ht 70.0 in | Wt 187.2 lb

## 2024-02-29 DIAGNOSIS — G7000 Myasthenia gravis without (acute) exacerbation: Secondary | ICD-10-CM | POA: Diagnosis not present

## 2024-02-29 MED ORDER — EFGARTIGIMOD ALFA-HYALUR-QVFC 180-2000 MG-UNIT/ML ~~LOC~~ SOLN
1008.0000 mg | Freq: Once | SUBCUTANEOUS | Status: AC
  Administered 2024-02-29: 1008 mg via SUBCUTANEOUS
  Filled 2024-02-29: qty 5.6

## 2024-02-29 NOTE — Progress Notes (Signed)
 Diagnosis: Myasthenia Gravis  Provider:  Mannam, Praveen MD  Procedure: Injection  Vyvgart Hytrulo, Dose: 1008mg , Site: subcutaneous, Number of injections: 1  Injection Site(s): Left lower quad. abdomen  Post Care: Observation period completed  Discharge: Condition: Good, Destination: Home . AVS Declined  Performed by:  Australia Droll, RN

## 2024-03-07 ENCOUNTER — Ambulatory Visit

## 2024-03-07 VITALS — BP 143/86 | HR 84 | Temp 98.7°F | Resp 16 | Ht 70.0 in | Wt 187.2 lb

## 2024-03-07 DIAGNOSIS — G7000 Myasthenia gravis without (acute) exacerbation: Secondary | ICD-10-CM | POA: Diagnosis not present

## 2024-03-07 MED ORDER — EFGARTIGIMOD ALFA-HYALUR-QVFC 180-2000 MG-UNIT/ML ~~LOC~~ SOLN
1008.0000 mg | Freq: Once | SUBCUTANEOUS | Status: AC
  Administered 2024-03-07: 1008 mg via SUBCUTANEOUS

## 2024-03-07 NOTE — Progress Notes (Signed)
 Diagnosis: Myasthenia Gravis  Provider:  Mannam, Praveen MD  Procedure: Injection  efgartigimod alfa  & hyaluronidase -qvfc (Vyvgart  Hytrulo) , Dose: 1008 mg, Site: subcutaneous, Number of injections: 1  Injection Site(s): Left upper quad. abdomen  Post Care: Observation period completed  Discharge: Condition: Good, Destination: Home . AVS Declined  Performed by:  Rocky FORBES Sar, RN

## 2024-03-14 ENCOUNTER — Other Ambulatory Visit: Payer: Self-pay | Admitting: Neurology

## 2024-03-14 ENCOUNTER — Other Ambulatory Visit (HOSPITAL_COMMUNITY): Payer: Self-pay

## 2024-03-14 ENCOUNTER — Other Ambulatory Visit: Payer: Self-pay | Admitting: Family Medicine

## 2024-03-14 ENCOUNTER — Other Ambulatory Visit: Payer: Self-pay

## 2024-03-14 DIAGNOSIS — F419 Anxiety disorder, unspecified: Secondary | ICD-10-CM

## 2024-03-14 MED ORDER — PREDNISONE 10 MG PO TABS
20.0000 mg | ORAL_TABLET | Freq: Every day | ORAL | 0 refills | Status: DC
  Filled 2024-03-14: qty 120, 60d supply, fill #0

## 2024-03-14 MED ORDER — DESVENLAFAXINE SUCCINATE ER 100 MG PO TB24
100.0000 mg | ORAL_TABLET | Freq: Every day | ORAL | 1 refills | Status: AC
  Filled 2024-03-14: qty 90, 90d supply, fill #0
  Filled 2024-06-01: qty 90, 90d supply, fill #1

## 2024-03-17 NOTE — Progress Notes (Signed)
Referring Physician:  Patel, Donika K, DO [address] [place],  [place] [address]  Primary Physician:  Alvia Bring, DO  History of Present Illness: 03/20/2024 Mr. Selma Jama Morad is here today with a chief complaint of bilateral cervical radiculopathy.  He has bilateral neck pain that radiates up to the back of his head and down the bilateral arms.  On the left it radiates down his neck to the posterior lateral aspect of his arm down to the back of his hand.  It also can get somewhat of his lateral aspect of his hand and forearm.  On the right side he feels the pain radiating from his neck predominantly down the lateral aspect of his distal arm into the lateral aspect of his hand and to a lesser degree down the posterior aspect of his arm.  He has had severe weakness in his left hand right upper extremity.  His left upper extremity has significant wrist drop and tricep weakness.  He feels weakness on the right side mostly in his hand grip but it continues to be much stronger than his left.  He does have a history of myasthenia gravis which is notably mostly in his eyes currently.  He continues to try to be physically active but has had difficulty lifting weights with the left arm predominantly given his weakness.  He has had previous physical therapy.  He has not had any epidural steroid injections.  His predominant issues are his weakness.  He does have some neck pain and posterior cervical genic headaches.  Conservative measures:  Physical therapy:  has participated in with [facility] therapy including regular antiinflammatories: not for the current issue Injections: no epidural steroid injections  Past Surgery: no spinal surgeries  The symptoms are causing a significant impact on the patient's life.   I have utilized the care everywhere function in epic to review the outside records available from external health systems.  Review of Systems:  A 10  point review of systems is negative, except for the pertinent positives and negatives detailed in the HPI.  Past Medical History: Past Medical History:  Diagnosis Date   Allergic rhinitis 12/24/2017   Allergy    Alopecia totalis    Anxiety 12/24/2017   Asthma    as a child   Chicken pox    Diabetes mellitus without complication (HCC)    Elevated liver enzymes 10/13/2020   Essential hypertension 02/10/2013   Eyelid abnormality 04/23/2021   GERD (gastroesophageal reflux disease) 01/27/2019   Hypogonadism in male 06/26/2019   Hypothyroidism 02/10/2013   Lower urinary tract symptoms (LUTS) 12/24/2017   Screening for colon cancer 12/24/2017   Suspected COVID-19 virus infection 06/20/2020   T2DM (type 2 diabetes mellitus) (HCC) 03/21/2018   Vitiligo     Past Surgical History: Past Surgical History:  Procedure Laterality Date   CARPAL TUNNEL RELEASE     [facility]il   NASAL SINUS SURGERY  1994   cut and open air passages/nostrils    Allergies: Allergies as of 03/20/2024   (No Known Allergies)    Medications:  Current Outpatient Medications:    amLODipine -benazepril  (LOTREL ) 10-40 MG capsule, TAKE 1 CAPSULE BY MOUTH ONCE DAILY, Disp: 90 capsule, Rfl: 1   buPROPion  (WELLBUTRIN  XL) 150 MG 24 hr tablet, Take 1 tablet (150 mg total) by mouth daily., Disp: 90 tablet, Rfl: 1   calcium  carbonate (OS-CAL) 600 MG TABS tablet, Take 1 tablet (600 mg total) by mouth 2 (  two) times daily with a meal., Disp: 60 tablet, Rfl: 5   Continuous Glucose Sensor (FREESTYLE LIBRE 3 SENSOR) MISC, Please apply for 14 days and then switch to new sensor, Disp: 2 each, Rfl: 3   dapagliflozin  propanediol (FARXIGA ) 5 MG TABS tablet, Take 1 tablet (5 mg total) by mouth daily., Disp: 90 tablet, Rfl: 1   desvenlafaxine  (PRISTIQ ) 100 MG 24 hr tablet, Take 1 tablet (100 mg total) by mouth daily., Disp: 90 tablet, Rfl: 1   levothyroxine  (SYNTHROID ) 175 MCG tablet, Take 1 tablet (175 mcg total) by mouth daily before breakfast., Disp: 90  tablet, Rfl: 0   metFORMIN  (GLUCOPHAGE -XR) 500 MG 24 hr tablet, Take 3 tablets (1,500 mg total) by mouth daily with breakfast., Disp: 270 tablet, Rfl: 1   Multiple Vitamins-Minerals (MULTIVITAMIN ADULT PO), Take 1 tablet by mouth daily., Disp: , Rfl:    pantoprazole  (PROTONIX ) 40 MG tablet, Take 1 tablet (40 mg total) by mouth daily., Disp: 90 tablet, Rfl: 2   predniSONE  (DELTASONE ) 10 MG tablet, Take 2 tablets (20 mg total) by mouth daily., Disp: 120 tablet, Rfl: 0   tadalafil  (CIALIS ) 10 MG tablet, Take 1-2 tablets (10-20 mg total) by mouth every other day as needed for erectile dysfunction., Disp: 15 tablet, Rfl: 1   tamsulosin  (FLOMAX ) 0.4 MG CAPS capsule, Take 1 capsule (0.4 mg total) by mouth daily., Disp: 90 capsule, Rfl: 3   tirzepatide  (MOUNJARO ) 7.5 MG/0.5ML Pen, Inject 7.5 mg into the skin once a week., Disp: 6 mL, Rfl: 0  Current Facility-Administered Medications:    0.9 %  sodium chloride  infusion, 500 mL, Intravenous, Once, Armbruster, Elspeth SQUIBB, MD  Social History: Social History   Tobacco Use   Smoking status: Never   Smokeless tobacco: Never  Vaping Use   Vaping status: Never Used  Substance Use Topics   Alcohol use: Not Currently   Drug use: Not Currently    Family Medical History: Family History  Problem Relation Age of Onset   Diabetes Father    Hypertension Father    Diabetes Paternal Aunt    Cancer Maternal Aunt        breast   Diabetes Sister    Hypertension Sister    Diabetes Brother    Colon cancer Neg Hx    Colon polyps Neg Hx    Esophageal cancer Neg Hx    Stomach cancer Neg Hx    Rectal cancer Neg Hx     Physical Examination: Vitals:   03/20/24 0958  BP: 136/88    General: Patient is in no apparent distress. Attention to examination is appropriate.  Neck:   Supple.  Full range of motion.  Respiratory: Patient is breathing without any difficulty.   NEUROLOGICAL:     Awake, alert, oriented to person, place, and time.  Speech is clear  and fluent.   Cranial Nerves: Pupils equal round and reactive to light.  Facial tone is symmetric.  Facial sensation is symmetric. Shoulder shrug is symmetric. Tongue protrusion is midline.    Strength: Side Biceps Triceps Deltoid Interossei Grip Wrist Ext. Wrist Flex.  R 5 4+ 5 5 4+ 4+ 5  L 5 4- 5 5 4- 3 5   Decreased reflexes in the bilateral triceps compared to the bilateral biceps.  Sensation decreased on the left and a C6-7 distribution worse than C7.  On the right in a C6-7 distribution worse in C6.  Imaging: Narrative & Impression  CLINICAL DATA:  Initial evaluation for arm weakness.  EXAM: MRI CERVICAL SPINE WITHOUT CONTRAST   TECHNIQUE: Multiplanar, multisequence MR imaging of the cervical spine was performed. No intravenous contrast was administered.   COMPARISON:  None Available.   FINDINGS: Alignment: Straightening of the normal cervical lordosis. No listhesis.   Vertebrae: Vertebral body height maintained without acute or chronic fracture. Bone marrow signal intensity within normal limits. No worrisome osseous lesions or abnormal marrow edema.   Cord: Normal signal and morphology.   Posterior Fossa, vertebral arteries, paraspinal tissues: Unremarkable.   Disc levels:   C2-C3: Small central disc protrusion minimally indents the ventral thecal sac. Mild left-sided facet hypertrophy. No canal or foraminal stenosis.   C3-C4: Small central disc protrusion mildly indents the ventral thecal sac. No spinal stenosis. Left-sided uncovertebral spurring with mild left C4 foraminal stenosis. Right neural foramina remains patent.   C4-C5: Right paracentral disc protrusion indents the right ventral thecal sac (series 5, image 13). Minimal flattening of the right ventral cord without cord signal changes. Mild spinal stenosis. Uncovertebral spurring with moderate left C5 foraminal stenosis. Right neural foramina remains patent.   C5-C6: Right paracentral disc  osteophyte complex indents the right ventral thecal sac (series 5, image 17). Flattening of the right hemi cord without cord signal changes. Mild spinal stenosis. Uncovertebral spurring with moderate right and mild left C6 foraminal stenosis.   C6-C7: Right eccentric disc osteophyte complex. Flattening of the ventral thecal sac with resultant mild spinal stenosis. Severe right with moderate left C7 foraminal stenosis.   C7-T1: Minimal annular disc bulge. Mild facet hypertrophy. No canal or foraminal stenosis.   IMPRESSION: 1. Multilevel cervical spondylosis with resultant mild diffuse spinal stenosis at C4-5 through C6-7. 2. Multifactorial degenerative changes with resultant multilevel foraminal narrowing as above. Notable findings include moderate left C5 and right C6 foraminal stenosis, with severe right and moderate left C7 foraminal narrowing.     Electronically Signed   By: Morene Hoard M.D.   On: 01/08/2024 15:58   I have personally reviewed the images and agree with the above interpretation.  It appears to me that he has significant spondylosis at the C5-6 level and C6-7 level.  Although these findings do appear worse on the right they are still prominent on the left.  The C5-6 level shows a disc herniation/osteophytic complex with flattening of the hemicord but moderate foraminal stenosis, on the left shows more mild foraminal stenosis.  On the C6/7 level severe on the right and moderate on the left  [facility]  [address] [place], [address]  [place], [place] [address] Tel: [phone] Fax: [phone] Test Date:  09/30/2023   Patient: Jediah Horger [address] [facility] DOB: 1965-08-10 Physician: Tonita Blanch, DO  Sex: Male Height: 5' 11 Ref Phys: Tonita Blanch, DO  ID#: [id number]     Technician:      History: This is a 58 year old man referred for evaluation of bilateral hand weakness.   NCV & EMG Findings: Extensive electrodiagnostic  testing of the right upper extremity and additional studies of the left shows:  Bilateral median, bilateral ulnar, and left mixed palmar sensory responses are within normal limits.  Right mixed palmar sensory response shows prolonged latency. Bilateral median and ulnar motor responses are within normal limits. Chronic motor axonal loss changes are seen affecting C7 myotome bilaterally, without accompanied active denervation.   Impression: Chronic C7 radiculopathy affecting bilateral upper extremities, mild. Right median neuropathy at or distal to the wrist, consistent with a clinical diagnosis of carpal tunnel syndrome.  Overall, these findings are very mild in degree electrically.     ___________________________ Tonita Blanch, DO   Medical Decision Making/Assessment and Plan: Mr. Grunder is a pleasant 58 y.o. male with significant severe cervical radiculopathy.  He does have a history of myasthenia gravis, this is mostly located in his eyes at this point.  He has been evaluated for motor deficits and neck pain with cervicalgia and radiculopathy.  He was evaluated by Dr. Blanch who performed an EMG diagnosing him with significant bilateral C7 radiculopathy.  Clinically he shows symptoms consistent with a C6 and C7 radiculopathy, it is appears to be C7 predominant on the left and C6 predominant on the right but is present bilaterally.  Overall the left side is worse on the right side.  He has motor weakness of 4 - in the left tricep and 3 and left wrist extension.  His right side shows 4+ out of 5 in the right tricep and 4+ out of 5 in the right wrist extension.  He has sensory deficits noted in the C6-7 distribution bilaterally and decreased reflexes.  His imaging does show significant cervical spondylosis at C6-7 and C5-6 bilaterally, imaging appears to be worse on the right than on the left but it does show significant stenosis on the left as well.  These levels seem to correlate well with his  clinical symptomatology as well as with his positive EMG diagnosing him with a bilateral C7 radiculopathy.  Given his findings I do feel like he meets criteria for a cervical decompression.  He does not appear to have a extensive amount of cervical kyphosis and he does appear to have some preserved disc height so may be a candidate for a cervical disc arthroplasty.  Will plan on having him get flexion-extension x-rays to evaluate whether or not he has any pathological movement.  Should he not have any pathological movement we would consider a C5-C7 anterior cervical disc arthroplasty, should he have pathological movement then we would consider a C5-C6 anterior cervical discectomy and fusion.  These imagings are pending.  Thank you for involving me in the care of this patient.    Penne MICAEL Sharps MD/MSCR Neurosurgery

## 2024-03-20 ENCOUNTER — Ambulatory Visit (INDEPENDENT_AMBULATORY_CARE_PROVIDER_SITE_OTHER): Admitting: Neurosurgery

## 2024-03-20 ENCOUNTER — Ambulatory Visit
Admission: RE | Admit: 2024-03-20 | Discharge: 2024-03-20 | Disposition: A | Discharge status: Home / Self Care | Source: Ambulatory Visit | Attending: Neurosurgery | Admitting: Neurosurgery

## 2024-03-20 ENCOUNTER — Encounter: Payer: Self-pay | Admitting: Neurosurgery

## 2024-03-20 ENCOUNTER — Ambulatory Visit
Admission: RE | Admit: 2024-03-20 | Discharge: 2024-03-20 | Disposition: A | Discharge status: Home / Self Care | Attending: Neurosurgery | Admitting: Neurosurgery

## 2024-03-20 VITALS — BP 136/88 | Ht 70.0 in | Wt 191.4 lb

## 2024-03-20 DIAGNOSIS — M4722 Other spondylosis with radiculopathy, cervical region: Secondary | ICD-10-CM

## 2024-03-20 DIAGNOSIS — M47812 Spondylosis without myelopathy or radiculopathy, cervical region: Secondary | ICD-10-CM | POA: Diagnosis not present

## 2024-03-20 DIAGNOSIS — M5412 Radiculopathy, cervical region: Secondary | ICD-10-CM | POA: Insufficient documentation

## 2024-03-20 DIAGNOSIS — M50222 Other cervical disc displacement at C5-C6 level: Secondary | ICD-10-CM | POA: Diagnosis not present

## 2024-03-20 DIAGNOSIS — M4802 Spinal stenosis, cervical region: Secondary | ICD-10-CM | POA: Diagnosis not present

## 2024-03-29 ENCOUNTER — Ambulatory Visit: Admitting: Neurology

## 2024-03-30 ENCOUNTER — Ambulatory Visit (INDEPENDENT_AMBULATORY_CARE_PROVIDER_SITE_OTHER): Payer: Self-pay | Admitting: Neurosurgery

## 2024-03-30 DIAGNOSIS — M5010 Cervical disc disorder with radiculopathy, unspecified cervical region: Secondary | ICD-10-CM

## 2024-03-30 DIAGNOSIS — M5412 Radiculopathy, cervical region: Secondary | ICD-10-CM

## 2024-04-04 ENCOUNTER — Encounter: Payer: Self-pay | Admitting: Family Medicine

## 2024-04-04 ENCOUNTER — Other Ambulatory Visit (HOSPITAL_COMMUNITY): Payer: Self-pay | Admitting: Neurology

## 2024-04-04 ENCOUNTER — Ambulatory Visit (INDEPENDENT_AMBULATORY_CARE_PROVIDER_SITE_OTHER): Admitting: Family Medicine

## 2024-04-04 ENCOUNTER — Other Ambulatory Visit (HOSPITAL_COMMUNITY): Payer: Self-pay

## 2024-04-04 ENCOUNTER — Ambulatory Visit (INDEPENDENT_AMBULATORY_CARE_PROVIDER_SITE_OTHER): Admitting: Neurology

## 2024-04-04 VITALS — BP 133/85 | HR 92 | Ht 71.0 in | Wt 188.6 lb

## 2024-04-04 VITALS — BP 124/77 | HR 88 | Ht 70.0 in | Wt 189.0 lb

## 2024-04-04 DIAGNOSIS — G7000 Myasthenia gravis without (acute) exacerbation: Secondary | ICD-10-CM

## 2024-04-04 DIAGNOSIS — M4802 Spinal stenosis, cervical region: Secondary | ICD-10-CM

## 2024-04-04 DIAGNOSIS — G7001 Myasthenia gravis with (acute) exacerbation: Secondary | ICD-10-CM | POA: Diagnosis not present

## 2024-04-04 DIAGNOSIS — Z23 Encounter for immunization: Secondary | ICD-10-CM

## 2024-04-04 DIAGNOSIS — I10 Essential (primary) hypertension: Secondary | ICD-10-CM

## 2024-04-04 DIAGNOSIS — E039 Hypothyroidism, unspecified: Secondary | ICD-10-CM | POA: Diagnosis not present

## 2024-04-04 DIAGNOSIS — M4722 Other spondylosis with radiculopathy, cervical region: Secondary | ICD-10-CM

## 2024-04-04 DIAGNOSIS — F419 Anxiety disorder, unspecified: Secondary | ICD-10-CM | POA: Diagnosis not present

## 2024-04-04 DIAGNOSIS — Z7984 Long term (current) use of oral hypoglycemic drugs: Secondary | ICD-10-CM

## 2024-04-04 DIAGNOSIS — E119 Type 2 diabetes mellitus without complications: Secondary | ICD-10-CM

## 2024-04-04 LAB — POCT GLYCOSYLATED HEMOGLOBIN (HGB A1C): HbA1c, POC (controlled diabetic range): 5.5 % (ref 0.0–7.0)

## 2024-04-04 NOTE — Progress Notes (Signed)
 Follow-up Visit   Date: 04/04/24   Mark Black MRN: [id number] DOB: 06-Jun-1966   Interim History: Mark Black is a 58 y.o. 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  The patient was accompanied to the clinic by wife.   IMPRESSION/PLAN: Seropositive myasthenia gravis with exacerbation, thymoma negative (2023). MGFA 8.  Symptoms have improved with Vyvgart . - Continue Vyvgart  Hytrulo injections every 4 weeks - Continue prednisone  20mg  daily, plan to taper after symptoms stabilize better  2.  Cervical canal stenosis at C4-5 through C6-7 with multilevel foraminal stenosis.  He continues to have 4/5 with left wrist extension and finger extension as well as numbness over the C6-7 dermatome on the right.  NCS/EMG shows bilateral C7 radiculopathy.  Appreciate Dr. Theressa evaluation.  Patient would like to move forward with surgery and will notify his office.   Return to clinic in 4 months  ------------------------------------------- History of present illness: Starting around the fall of 2022, he began having intermittent droopiness of the left eyelid.  It can occur several days of the week and when present, it will be constant all day.  Rest does not seem to make any improvement, as he can wake up with it.  No double vision, difficulty swallowing/talking, jaw fatigue, or limb weakness.  He was seen by his ophthalmologist who referred him for evaluation of myasthenia gravis.    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    UPDATE 11/25/2022:  He was last seen in March at which time MG was very well controlled on prednisone  10mg  daily.  Unfortunately, over the past month, he reports generalized  weakness, worsening double vision and droopiness of the eyelids.  His eyelids get very droopy, especially on the left where it can remain completely closed.  His right eyelid is not as severe. Double vision improved with closing one eye.   He has jaw fatigue and intermittent difficulty with swallowing.  No shortness of breath or slurred speech.  He increased prednisone  to 20mg  daily but did not appreciate any benefit.  His HbA1c is 10 and is his currently not monitoring blood glucose.  He was recently started on Ozepmic.  No recent illnesses.   UPDATE 06/15/2023:  He was last seen in May and missed prior follow-up visit.  He reports after starting Vyvgart  Hytrulo his symptoms significantly improved.  He was on prednisone  15mg  and when he tapered down to 10mg  every other day, his symptoms returned.  Since October, he has been on prednisone  20mg  daily without any benefit.  He continues to have severe droopiness and double vision.  He has some weakness in the arms and legs.  No difficulty with swallowing/speech. He is not taking mestinon  due to GI side effects.   UPDATE 08/25/2023:  He is here for follow-up visit.  He received IVIG in January and started to see some improvement about two week later, but symptoms did not completely get better.  He continues to have droopiness of the eyelids daily, which does not completely close, but will get to where it covers half of his eye at times.  He is able to drive again.  He has not noticed any benefit on prednisone  20mg  and reports diabetes is getting worse with last HbA1c 8.8.  They would like  to consider lowering the dose.   For the past two months, he also complains of left hand weakness and numbness.  He has dropped things occasionally.    UPDATE 09/29/2023:  He is here for follow-up visit.  He continues to have droopiness of the eyelids, which fluctuate.  He had double vision a few weeks ago.  He had IVIG on 3/5 and reports that eyelids were not as droopy, but this  only lasted a few days and then went back to being droopy all the time.  He is on prednisone  10mg  daily.    UPDATE 11/10/2023:  He is here for follow-up visit.  He completed series of Vyvgart  Hytrulo on 4/16, and reports a few days of improved symptoms, but this was not lasting.  He continues to have constant double vision and ptosis.  He is also getting much more tired and unable to be active as previously.  He has not been to the gym in a month.  Arms and legs feel tired.  He has noticed skin can bruise easily.     UPDATE 12/15/2023:  He is here for follow-up visit.  Unfortunately, there has not been any significant change since his last visit.  He continues to have constant double vision and ptosis, worse on the right eye.  He is also having worsening left hand weakness and unable to hold objects or spread his fingers apart.  He has started PT, but not noticed a marked improvement.   UPDATE 02/02/2024:  Since his last series of Vyvgart  Hytrulo injection, his ptosis and double has slightly improved.  He has worsening ptosis when looking laterally and double vision when looking down.  If he is looking straight ahead, symptoms are better.  No problems with speech/swallow.   He has been having shooting pain that radiates into the right thumb, which is new.  Initially, he was having only numbness/tingling into the left arm. He continues to have left hand weakness, which has improved somewhat a with PT.  He needs to use his right hand to support his left hand when lifting.   UPDATE 04/04/2024:  He is here for follow-up visit.  His myasthenia gravis has been doing much better.  His ptosis has not resolved, but his eyes stay open for much longer periods of time.  He no longer has double vision.   He saw Dr. Claudene for second opinion for cervical radiculopathy who has recommended cervical fusion.  Patient would like to proceed with this. He is still having left hand weakness and worsening numbness over the right  thumb and lateral forearm.  HbA1c has significantly improved with last HbA1c 5.6 on.   Medications:  Current Outpatient Medications on File Prior to Visit  Medication Sig Dispense Refill   amLODipine -benazepril  (LOTREL ) 10-40 MG capsule TAKE 1 CAPSULE BY MOUTH ONCE DAILY 90 capsule 1   buPROPion  (WELLBUTRIN  XL) 150 MG 24 hr tablet Take 1 tablet (150 mg total) by mouth daily. 90 tablet 1   calcium  carbonate (OS-CAL) 600 MG TABS tablet Take 1 tablet (600 mg total) by mouth 2 (two) times daily with a meal. 60 tablet 5   Continuous Glucose Sensor (FREESTYLE LIBRE 3 SENSOR) MISC Please apply for 14 days and then switch to new sensor 2 each 3   dapagliflozin  propanediol (FARXIGA ) 5 MG TABS tablet Take 1 tablet (5 mg total) by mouth daily. 90 tablet 1   desvenlafaxine  (PRISTIQ ) 100 MG 24 hr tablet Take 1 tablet (100 mg total) by  mouth daily. 90 tablet 1   levothyroxine  (SYNTHROID ) 175 MCG tablet Take 1 tablet (175 mcg total) by mouth daily before breakfast. 90 tablet 0   metFORMIN  (GLUCOPHAGE -XR) 500 MG 24 hr tablet Take 3 tablets (1,500 mg total) by mouth daily with breakfast. 270 tablet 1   Multiple Vitamins-Minerals (MULTIVITAMIN ADULT PO) Take 1 tablet by mouth daily.     pantoprazole  (PROTONIX ) 40 MG tablet Take 1 tablet (40 mg total) by mouth daily. 90 tablet 2   predniSONE  (DELTASONE ) 10 MG tablet Take 2 tablets (20 mg total) by mouth daily. 120 tablet 0   tadalafil  (CIALIS ) 10 MG tablet Take 1-2 tablets (10-20 mg total) by mouth every other day as needed for erectile dysfunction. 15 tablet 1   tamsulosin  (FLOMAX ) 0.4 MG CAPS capsule Take 1 capsule (0.4 mg total) by mouth daily. 90 capsule 3   tirzepatide  (MOUNJARO ) 7.5 MG/0.5ML Pen Inject 7.5 mg into the skin once a week. 6 mL 0   Current Facility-Administered Medications on File Prior to Visit  Medication Dose Route Frequency Provider Last Rate Last Admin   0.9 %  sodium chloride  infusion  500 mL Intravenous Once Armbruster, Elspeth SQUIBB, MD         Allergies: No Known Allergies  Vital Signs:  BP 133/85   Pulse 92   Ht 5' 11 (1.803 m)   Wt 188 lb 9.6 oz (85.5 kg)   SpO2 98%   BMI 26.30 kg/m    Neurological Exam: MENTAL STATUS including orientation to time, place, person, recent and remote memory, attention span and concentration, language, and fund of knowledge is normal.  Speech is not dysarthric.  CRANIAL NERVES:   Left eye is blind.  Extraocular muscles are intact.  Moderate right and mild left ptosis, worse with sustained upgaze (improved) Face is symmetric. Palate elevates symmetrically.  Facial muscles are intact.   MOTOR:  Mild left triceps and extensor forearm atrophy.  No fasciculations or abnormal movements.  No pronator drift.   Upper Extremity:  Right  Left  Deltoid  5/5   5/5   Biceps  5/5   5/5   Triceps  5/5   5-/5   Wrist extensors  5/5   4/5   Wrist flexors  5/5   5/5   Finger extensors  5/5   4/5   Finger flexors  5/5   5/5   Dorsal interossei  5/5   4/5   Abductor pollicis  5/5   5/5   Tone (Ashworth scale)  0  0   Lower Extremity:  Right  Left  Hip flexors  5/5   5/5   Dorsiflexors  5/5   5/5   Tone (Ashworth scale)  0  0     MSRs:                                           Right        Left brachioradialis 2+  2+  biceps 2+  2+  triceps 2+  3+  patellar 3+  3+  ankle jerk 2+  2+   SENSATION:  Reduced temperature on the left hand, vibration intact throughout.   COORDINATION/GAIT:   Gait narrow based and stable.   Data: Myasthenia gravis panel 08/13/2021:  AChR blocking 40*, modulating 79*  Lab Results  Component Value Date   HGBA1C 5.5 04/04/2024  CT chest wwo contrast 09/12/2021:  Negative for thymoma  NCS/EMG of the right arm 09/30/2023: Chronic C7 radiculopathy affecting bilateral upper extremities, mild. Right median neuropathy at or distal to the wrist, consistent with a clinical diagnosis of carpal tunnel syndrome.  Overall, these findings are very mild in degree  electrically.  MRI cervical spine wo 01/08/2024: 1. Multilevel cervical spondylosis with resultant mild diffuse spinal stenosis at C4-5 through C6-7. 2. Multifactorial degenerative changes with resultant multilevel foraminal narrowing as above. Notable findings include moderate left C5 and right C6 foraminal stenosis, with severe right and moderate left C7 foraminal narrowing.   Thank you for allowing me to participate in patient's care.  If I can answer any additional questions, I would be pleased to do so.    Sincerely,    Scotti Motter K. Tobie, DO

## 2024-04-04 NOTE — Progress Notes (Signed)
Mark Black - 58 y.o. male MRN [id number]  Date of birth: December 18, 1965  Subjective Chief Complaint  Patient presents with   Myasthenia Gravis    HPI Mark Black is a 58 y.o. male here today for follow up visit.   He continues to struggle with control of his myasthenia.  He continues to see neurology.  Overall symptoms are better than they had been but he did notice a little diplopia and eye drooping recently.  He remains on prednisone  and recently completed Vyvgart  Hytrulo injection.  He is considering having cervical spine surgery.  Anterior cervical discectomy with fusion has been recommended by neurosurgeon.   His blood pressure has remained well controlled with amlodipine /benazepril .  Tolerating well at current strength.  He has not had chest pain, shortness of breath, palpitations, headache or vision changes.   Remains on metformin , farxiga  and mounjaro  for management of HTN.   Mood is stable with pristiq  and bupropion .    ROS:  A comprehensive ROS was completed and negative except as noted per HPI    No Known Allergies  Past Medical History:  Diagnosis Date   Allergic rhinitis 12/24/2017   Allergy    Alopecia totalis    Anxiety 12/24/2017   Asthma    as a child   Chicken pox    Diabetes mellitus without complication (HCC)    Elevated liver enzymes 10/13/2020   Essential hypertension 02/10/2013   Eyelid abnormality 04/23/2021   GERD (gastroesophageal reflux disease) 01/27/2019   Hypogonadism in male 06/26/2019   Hypothyroidism 02/10/2013   Lower urinary tract symptoms (LUTS) 12/24/2017   Screening for colon cancer 12/24/2017   Suspected COVID-19 virus infection 06/20/2020   T2DM (type 2 diabetes mellitus) (HCC) 03/21/2018   Vitiligo     Past Surgical History:  Procedure Laterality Date   CARPAL TUNNEL RELEASE     Bil   NASAL SINUS SURGERY  1994   cut and open air passages/nostrils    Social History   Socioeconomic History   Marital status: Married     Spouse name: Not on file   Number of children: 2   Years of education: Not on file   Highest education level: Not on file  Occupational History   Not on file  Tobacco Use   Smoking status: Never   Smokeless tobacco: Never  Vaping Use   Vaping status: Never Used  Substance and Sexual Activity   Alcohol use: Not Currently   Drug use: Not Currently   Sexual activity: Not on file  Other Topics Concern   Not on file  Social History Narrative   Right Handed   Lives in a one story home with wife and son   Caffeine 2 cups coffee     Father passed away in his 51's from drowning   Work with Holiday representative   Social Drivers of Health   Financial Resource Strain: Low Risk  (04/04/2024)   Overall Financial Resource Strain (CARDIA)    Difficulty of Paying Living Expenses: Not hard at all  Food Insecurity: No Food Insecurity (04/04/2024)   Hunger Vital Sign    Worried About Running Out of Food in the Last Year: Never true    Ran Out of Food in the Last Year: Never true  Transportation Needs: No Transportation Needs (04/04/2024)   PRAPARE - Administrator, Civil Service (Medical): No    Lack of Transportation (Non-Medical): No  Physical Activity: Sufficiently Active (04/04/2024)   Exercise Vital Sign  Days of Exercise per Week: 3 days    Minutes of Exercise per Session: 60 min  Stress: No Stress Concern Present (04/04/2024)   [employer] of Occupational Health - Occupational Stress Questionnaire    Feeling of Stress: Only a little  Social Connections: Moderately Isolated (04/04/2024)   Social Connection and Isolation Panel    Frequency of Communication with Friends and Family: Three times a week    Frequency of Social Gatherings with Friends and Family: Once a week    Attends Religious Services: Never    Database administrator or Organizations: No    Attends Engineer, structural: Never    Marital Status: Married    Family History  Problem Relation Age of  Onset   Diabetes Father    Hypertension Father    Diabetes Paternal Aunt    Cancer Maternal Aunt        breast   Diabetes Sister    Hypertension Sister    Diabetes Brother    Colon cancer Neg Hx    Colon polyps Neg Hx    Esophageal cancer Neg Hx    Stomach cancer Neg Hx    Rectal cancer Neg Hx     Health Maintenance  Topic Date Due   HIV Screening  Never done   Hepatitis C Screening  Never done   COVID-19 Vaccine (3 - 2025-26 season) 04/20/2025 (Originally 03/13/2024)   Diabetic kidney evaluation - Urine ACR  08/05/2024   FOOT EXAM  08/05/2024   OPHTHALMOLOGY EXAM  08/10/2024   HEMOGLOBIN A1C  10/02/2024   Diabetic kidney evaluation - eGFR measurement  11/24/2024   DTaP/Tdap/Td (2 - Td or Tdap) 01/27/2027   Colonoscopy  05/27/2028   Pneumococcal Vaccine: 50+ Years  Completed   Influenza Vaccine  Completed   Zoster Vaccines- Shingrix  Completed   Hepatitis B Vaccines 19-59 Average Risk  Aged Out   HPV VACCINES  Aged Out   Meningococcal B Vaccine  Aged Out     ----------------------------------------------------------------------------------------------------------------------------------------------------------------------------------------------------------------- Physical Exam BP 124/77 (BP Location: Left Arm, Patient Position: Sitting, Cuff Size: Normal)   Pulse 88   Ht 5' 10 (1.778 m)   Wt 189 lb (85.7 kg)   SpO2 99%   BMI 27.12 kg/m   Physical Exam Constitutional:      Appearance: Normal appearance.  Eyes:     General: No scleral icterus. Cardiovascular:     Rate and Rhythm: Normal rate and regular rhythm.  Pulmonary:     Effort: Pulmonary effort is normal.     Breath sounds: Normal breath sounds.  Neurological:     Mental Status: He is alert.  Psychiatric:        Mood and Affect: Mood normal.        Behavior: Behavior normal.      ------------------------------------------------------------------------------------------------------------------------------------------------------------------------------------------------------------------- Assessment and Plan  T2DM (type 2 diabetes mellitus) (HCC) Blood sugars are well-controlled at this time.  Recommend continuation of current medications for management of diabetes  Essential hypertension Blood pressure is well-controlled.  Recommend continuation of current medications.  Hypothyroidism Feels well with current strength of levothyroxine .  Continue at current strength.   Anxiety He continues to do well with combination of Pristiq  and bupropion .  Myasthenia gravis without acute exacerbation (HCC) Continues to have some symptoms related to myasthenia.  Overall this is improved.  He will continue management per neurology.   No orders of the defined types were placed in this encounter.   Return in about 6 months (  around 10/02/2024) for Type 2 Diabetes.

## 2024-04-04 NOTE — Assessment & Plan Note (Signed)
Feels well with current strength of levothyroxine.  Continue at current strength.

## 2024-04-04 NOTE — Assessment & Plan Note (Signed)
He continues to do well with combination of Pristiq and bupropion. ?

## 2024-04-04 NOTE — Assessment & Plan Note (Signed)
 Blood sugars are well-controlled at this time.  Recommend continuation of current medications for management of diabetes

## 2024-04-04 NOTE — Assessment & Plan Note (Signed)
Blood pressure is well-controlled.  Recommend continuation of current medications. 

## 2024-04-04 NOTE — Assessment & Plan Note (Signed)
 Continues to have some symptoms related to myasthenia.  Overall this is improved.  He will continue management per neurology.

## 2024-04-06 ENCOUNTER — Ambulatory Visit: Payer: Self-pay | Admitting: Neurosurgery

## 2024-04-06 DIAGNOSIS — M5010 Cervical disc disorder with radiculopathy, unspecified cervical region: Secondary | ICD-10-CM

## 2024-04-06 DIAGNOSIS — M5412 Radiculopathy, cervical region: Secondary | ICD-10-CM

## 2024-04-06 NOTE — Telephone Encounter (Signed)
 Neurosurgery surgical decision making note  **Patient Information** - Name: Mark Black - Age: 58 y.o. - MRN: [id number] - PCP: Alvia Bring, DO ---  **1. History and Physical Examination** - Chief Complaint: C6 and C7 radiculopathy secondary to disc herniation and foraminal stenosis - Duration of symptoms: Greater than 7 months - Neurological findings: Bilateral radiculopathy, bilateral upper extremity sensation loss in the C6 and C7 dermatome, bilateral weakness in the C6 and C7 myotome, no evidence of myelopathy - Functional limitations: Decreased strength and sensation in the bilateral upper extremities. - Prior spine surgeries: No prior cervical spine surgery  ---  **2. Imaging Reports** - MRI findings:  IMPRESSION: 1. Multilevel cervical spondylosis with resultant mild diffuse spinal stenosis at C4-5 through C6-7. 2. Multifactorial degenerative changes with resultant multilevel foraminal narrowing as above. Notable findings include moderate left C5 and right C6 foraminal stenosis, with severe right and moderate left C7 foraminal narrowing.     Electronically Signed   By: Morene Hoard M.D.   On: 01/08/2024 15:58  - X-ray findings:  Narrative & Impression  CLINICAL DATA:  Evaluate for motion.   EXAM: CERVICAL SPINE - COMPLETE 4+ VIEW   COMPARISON:  None Available.   FINDINGS: There is 2 mm of retrolisthesis at C5-C6 which is stable with flexion and extension. Alignment is otherwise anatomic. No acute fractures are seen. There is mild disc space narrowing and endplate osteophyte formation at C5-C6 and C6-C7 compatible with degenerative change. There is no prevertebral soft tissue swelling.   IMPRESSION: 1. 2 mm of retrolisthesis at C5-C6 which is stable with flexion and extension. 2. Mild degenerative changes at C5-C6 and C6-C7.     Electronically Signed   By: Greig Pique M.D.   On: 03/26/2024 23:16     ---  **3. Conservative  Treatment History** - Physical therapy: Completed   - Duration: 11/22/2023 to 01/05/2024   - Outcome: Continued weakness - Medications tried: Muscle relaxants, NSAIDs, pain relievers, neuropathic pain medication   - Response: No significant long-term resolution. ---  **4. Surgical Plan** - Procedure: C5-C6 anterior cervical discectomy and fusion - Rationale for surgery: EMG proven C6/C7 radiculopathy with MRI evidence of compression at the C6 and C7 nerve root bilaterally.

## 2024-04-07 ENCOUNTER — Telehealth: Payer: Self-pay

## 2024-04-07 DIAGNOSIS — M5010 Cervical disc disorder with radiculopathy, unspecified cervical region: Secondary | ICD-10-CM

## 2024-04-07 DIAGNOSIS — M5412 Radiculopathy, cervical region: Secondary | ICD-10-CM

## 2024-04-08 ENCOUNTER — Other Ambulatory Visit: Payer: Self-pay | Admitting: Family Medicine

## 2024-04-09 NOTE — Progress Notes (Signed)
 This was a no charge encounter.

## 2024-04-10 ENCOUNTER — Other Ambulatory Visit (HOSPITAL_COMMUNITY): Payer: Self-pay

## 2024-04-10 ENCOUNTER — Other Ambulatory Visit: Payer: Self-pay

## 2024-04-10 DIAGNOSIS — M5412 Radiculopathy, cervical region: Secondary | ICD-10-CM

## 2024-04-10 DIAGNOSIS — Z01818 Encounter for other preprocedural examination: Secondary | ICD-10-CM

## 2024-04-10 DIAGNOSIS — M5010 Cervical disc disorder with radiculopathy, unspecified cervical region: Secondary | ICD-10-CM

## 2024-04-10 MED ORDER — BUPROPION HCL ER (XL) 150 MG PO TB24
150.0000 mg | ORAL_TABLET | Freq: Every day | ORAL | 1 refills | Status: AC
  Filled 2024-04-10: qty 90, 90d supply, fill #0
  Filled 2024-07-17: qty 90, 90d supply, fill #1

## 2024-04-10 NOTE — Telephone Encounter (Signed)
 Planned surgery: C5-7 anterior cervical discectomy and fusion   Surgery date: 05/11/2024 at [facility]: [address], [place], [place] [address]) - you will find out your arrival time the business day before your surgery.   Pre-op appointment at [facility] Pre-admit Testing: you will receive a call with a date/time for this appointment. If you are scheduled for an in person appointment, Pre-admit Testing is located on the first floor of the [facility] building, [address], [address]. During this appointment, they will advise you which medications you can take the morning of surgery, and which medications you will need to hold for surgery. Labs (such as blood work, EKG) may be done at your pre-op appointment. You are not required to fast for these labs. Should you need to change your pre-op appointment, please call Pre-admit testing at [phone].     Diabetes/heart failure/kidney disease/weight loss medications that require an extended hold: Per anesthesia guidelines (due to the increased risk of aspiration caused by delayed gastric emptying):  Mounjaro  injections: hold for 7 days prior to surgery Dapagliflozin  (Farxiga ): hold for 3 days prior to surgery Metformin : hold for 2 days prior to surgery    Surgical clearance: we will send a clearance form to Dr Velma Ku. They may wish to see you in their office prior to signing the clearance form. If so, they may call you to schedule an appointment.    NSAIDS (Non-steroidal anti-inflammatory drugs): because you are having a fusion, please avoid taking any NSAIDS (examples: ibuprofen, motrin, aleve, naproxen, meloxicam, diclofenac) for 3 months after surgery. Celebrex is an exception and is OK to take, if prescribed. Tylenol  is not an NSAID.    Common restrictions after spine surgery: No bending, lifting, or twisting ("BLT"). Avoid lifting objects heavier than 10 pounds for the  first 6 weeks after surgery. Where possible, avoid household activities that involve lifting, bending, reaching, pushing, or pulling such as laundry, vacuuming, grocery shopping, and childcare. Try to arrange for help from friends and family for these activities while you heal. Do not drive while taking prescription pain medication. Weeks 6 through 12 after surgery: avoid lifting more than 25 pounds.    X-rays after surgery: Because you are having a fusion: for appointments after your 2 week follow-up: please arrive at the [facility] outpatient imaging center ([address], [place]) or [facility] one hour prior to your appointment for x-rays. This applies to every appointment after your 2 week follow-up. Failure to do so may result in your appointment being rescheduled. *We recently started construction to have x-ray in our office. This may be completed by the time you come in for your 6 week post-op appointment. Please check with us  closer to that time to see if you can have your x-rays at our office*    How to contact us :  If you have any questions/concerns before or after surgery, you can reach us  at [phone], or you can send a mychart message. We can be reached by phone or mychart 8am-4pm, Monday-Friday.  *Please note: Calls after 4pm are forwarded to a third party answering service. Mychart messages are not routinely monitored during evenings, weekends, and holidays. Please call our office to contact the answering service for urgent concerns during non-business hours.   If you have FMLA/disability paperwork, please drop it off or fax it to [phone]   Appointments/FMLA & disability paperwork: [employer] Registered Nurse/Surgery scheduler: Ruthella Kirchman,  RN Certified Medical Assistants: Don, CMA, Elenor, CMA, & Damien, [place] Physician Assistants: Lyle Decamp, PA-C, Edsel Goods, PA-C & Glade Boys, PA-C Surgeons: Penne Sharps, MD & Reeves Daisy, MD   [facility] PREADMIT TESTING VISIT and SURGERY INFORMATION SHEET   Now that surgery has been scheduled you can anticipate several phone calls from [facility] services. A pharmacy technician will call you to verify your current list of medications taken at home.               The Pre-Service Center will call to verify your insurance information and to give you billing estimates and information.             The Preadmit Testing Office will be calling to schedule a visit to obtain information for the anesthesia team and provide instructions on preparation for surgery.  What can you expect for the Preadmit Testing Visit: Appointments may be scheduled in-person or by telephone.  If a telephone visit is scheduled, you may be asked to come into the office to have lab tests or other studies performed.   This visit will not be completed any greater than 14 days prior to your surgery.  If your surgery has been scheduled for a future date, please do not be alarmed if we have not contacted you to schedule an appointment more than a month prior to the surgery date.    Please be prepared to provide the following information during this appointment:            -Personal medical history                                               -Medication and allergy list            -Any history of problems with anesthesia              -Recent lab work or diagnostic studies            -Please notify us  of any needs we should be aware of to provide the best care possible           -You will be provided with instructions on how to prepare for your surgery.    On The Day of Surgery:  You must have a driver to take you home after surgery, you will be asked not to drive for 24 hours following surgery.  Taxi, Gisele and non-medical transport will not be acceptable means of transportation unless you have a responsible individual who will be traveling with you.  Visitors in the surgical  area:   2 people will be able to visit you in your room once your preparation for surgery has been completed. During surgery, your visitors will be asked to wait in the Surgery Waiting Area.  It is not a requirement for them to stay, if they prefer to leave and come back.  Your visitor(s) will be given an update once the surgery has been completed.  No visitors are allowed in the initial recovery room to respect patient privacy and safety.  Once you are more awake and transfer to the secondary recovery area, or are transferred to an inpatient room, visitors will again be able to see you.  To respect and protect your privacy: We will ask on the day of surgery who your driver  will be and what the contact number for that individual will be. We will ask if it is okay to share information with this individual, or if there is an alternative individual that we, or the surgeon, should contact to provide updates and information. If family or friends come to the surgical information desk requesting information about you, who you have not listed with us , no information will be given.   It may be helpful to designate someone as the main contact who will be responsible for updating your other friends and family.    PREADMIT TESTING OFFICE: [phone] SAME DAY SURGERY: [phone] We look forward to caring for you before and throughout the process of your surgery.

## 2024-04-10 NOTE — Telephone Encounter (Signed)
 Error

## 2024-04-11 ENCOUNTER — Other Ambulatory Visit (HOSPITAL_COMMUNITY): Payer: Self-pay

## 2024-04-14 ENCOUNTER — Telehealth: Payer: Self-pay | Admitting: Pharmacy Technician

## 2024-04-14 NOTE — Telephone Encounter (Addendum)
 Auth Submission: APPROVED Site of care: Site of care: [facility] Payer: [employer] Medication & CPT/J Code(s) submitted: vyvgart  hytrulo Diagnosis Code: g70.00 Route of submission (phone, fax, portal):  Phone # Fax # Auth type: Buy/Bill PB  Units/visits requested: wkly x 4 weeks (4wks on - 4wks off- repeat cycle) Based on medical necessity. Rep: F.C. 4p 05/24/24  Reference number: [id number] Approval from: 04/05/24 to 04/04/25

## 2024-04-17 ENCOUNTER — Ambulatory Visit

## 2024-04-17 ENCOUNTER — Encounter: Payer: Self-pay | Admitting: Neurology

## 2024-04-17 VITALS — BP 135/79 | HR 71 | Temp 97.5°F | Resp 20 | Ht 70.0 in | Wt 191.4 lb

## 2024-04-17 DIAGNOSIS — G7000 Myasthenia gravis without (acute) exacerbation: Secondary | ICD-10-CM | POA: Diagnosis not present

## 2024-04-17 MED ORDER — EFGARTIGIMOD ALFA-HYALUR-QVFC 180-2000 MG-UNIT/ML ~~LOC~~ SOLN
1008.0000 mg | Freq: Once | SUBCUTANEOUS | Status: AC
  Administered 2024-04-17: 1008 mg via SUBCUTANEOUS
  Filled 2024-04-17: qty 5.6

## 2024-04-17 NOTE — Progress Notes (Signed)
 Diagnosis: Myasthenia Gravis  Provider:  Mannam, Praveen MD  Procedure: Injection  Vyvgart  Hytrulo , Dose: 1008mg , Site: subcutaneous, Number of injections: 1  Injection Site(s): Left lower quad. abdomen  Post Care: Observation period completed and Subcutaneous infusion needle discontinued  Discharge: Condition: Good, Destination: Home . AVS Declined  Performed by:  Leita FORBES Miles, LPN

## 2024-04-20 NOTE — Telephone Encounter (Signed)
 Left message for pt to return call.

## 2024-04-24 ENCOUNTER — Ambulatory Visit

## 2024-04-24 VITALS — BP 127/86 | HR 86 | Temp 97.7°F | Resp 14 | Ht 70.0 in | Wt 192.8 lb

## 2024-04-24 DIAGNOSIS — G7000 Myasthenia gravis without (acute) exacerbation: Secondary | ICD-10-CM | POA: Diagnosis not present

## 2024-04-24 MED ORDER — EFGARTIGIMOD ALFA-HYALUR-QVFC 180-2000 MG-UNIT/ML ~~LOC~~ SOLN
1008.0000 mg | Freq: Once | SUBCUTANEOUS | Status: AC
  Administered 2024-04-24: 1008 mg via SUBCUTANEOUS
  Filled 2024-04-24: qty 5.6

## 2024-04-24 NOTE — Progress Notes (Signed)
 Diagnosis: Myasthenia Gravis  Provider:  Mannam, Praveen MD  Procedure: Injection  efgartigimod alfa  & hyaluronidase -qvfc (Vyvgart  Hytrulo) , Dose: 1008 mg, Site: subcutaneous, Number of injections: 1  Injection Site(s): Right upper quad. abdomen  Post Care: Observation period completed  Discharge: Condition: Stable, Destination: Home . AVS Declined  Performed by:  Rocky FORBES Sar, RN

## 2024-05-01 ENCOUNTER — Ambulatory Visit (INDEPENDENT_AMBULATORY_CARE_PROVIDER_SITE_OTHER)

## 2024-05-01 VITALS — BP 124/82 | HR 93 | Temp 98.0°F | Resp 16 | Ht 70.0 in | Wt 192.4 lb

## 2024-05-01 DIAGNOSIS — G7000 Myasthenia gravis without (acute) exacerbation: Secondary | ICD-10-CM

## 2024-05-01 MED ORDER — EFGARTIGIMOD ALFA-HYALUR-QVFC 180-2000 MG-UNIT/ML ~~LOC~~ SOLN
1008.0000 mg | Freq: Once | SUBCUTANEOUS | Status: AC
  Administered 2024-05-01: 1008 mg via SUBCUTANEOUS

## 2024-05-01 NOTE — Progress Notes (Signed)
 Diagnosis: Myasthenia Gravis  Provider:  Mannam, Praveen MD  Procedure: Injection  Vyvgart  Hytrulo, Dose: 1008 mg, Site: subcutaneous, Number of injections: 1  Injection Site(s): Left lower quad. abdomen  Post Care: left lower quad abdominal injection  Discharge: Condition: Good, Destination: Home . AVS Declined  Performed by:  Maximiano JONELLE Pouch, LPN

## 2024-05-04 ENCOUNTER — Other Ambulatory Visit

## 2024-05-08 ENCOUNTER — Ambulatory Visit (INDEPENDENT_AMBULATORY_CARE_PROVIDER_SITE_OTHER)

## 2024-05-08 VITALS — BP 150/83 | HR 100 | Temp 98.1°F | Resp 16 | Ht 71.0 in | Wt 192.0 lb

## 2024-05-08 DIAGNOSIS — G7000 Myasthenia gravis without (acute) exacerbation: Secondary | ICD-10-CM | POA: Diagnosis not present

## 2024-05-08 MED ORDER — EFGARTIGIMOD ALFA-HYALUR-QVFC 180-2000 MG-UNIT/ML ~~LOC~~ SOLN
1008.0000 mg | Freq: Once | SUBCUTANEOUS | Status: AC
  Administered 2024-05-08: 1008 mg via SUBCUTANEOUS
  Filled 2024-05-08: qty 5.6

## 2024-05-08 NOTE — Progress Notes (Signed)
 Diagnosis: Myasthenia Gravis  Provider:  Mannam, Praveen MD  Procedure: Injection  Vyvgart  Hytrulo, Dose: 1008 mg, Site: subcutaneous, Number of injections: 1  Injection Site(s): Right lower quad. abdomne  Post Care: Observation period completed  Discharge: Condition: Good, Destination: Home . AVS Declined  Performed by:  Maximiano JONELLE Pouch, LPN

## 2024-05-10 ENCOUNTER — Other Ambulatory Visit (HOSPITAL_COMMUNITY): Payer: Self-pay

## 2024-05-10 ENCOUNTER — Other Ambulatory Visit: Payer: Self-pay | Admitting: Family Medicine

## 2024-05-10 ENCOUNTER — Other Ambulatory Visit: Payer: Self-pay | Admitting: Neurology

## 2024-05-10 DIAGNOSIS — E039 Hypothyroidism, unspecified: Secondary | ICD-10-CM

## 2024-05-10 MED ORDER — PREDNISONE 10 MG PO TABS
20.0000 mg | ORAL_TABLET | Freq: Every day | ORAL | 1 refills | Status: AC
  Filled 2024-05-11: qty 120, 60d supply, fill #0
  Filled 2024-07-15: qty 120, 60d supply, fill #1
  Filled ????-??-??: fill #0

## 2024-05-10 MED ORDER — LEVOTHYROXINE SODIUM 175 MCG PO TABS
175.0000 ug | ORAL_TABLET | Freq: Every day | ORAL | 3 refills | Status: AC
  Filled 2024-05-11: qty 90, 90d supply, fill #0
  Filled 2024-08-14 – 2024-08-17 (×2): qty 90, 90d supply, fill #1
  Filled ????-??-??: fill #0

## 2024-05-11 ENCOUNTER — Other Ambulatory Visit (HOSPITAL_COMMUNITY): Payer: Self-pay

## 2024-05-11 DIAGNOSIS — M5412 Radiculopathy, cervical region: Secondary | ICD-10-CM | POA: Insufficient documentation

## 2024-05-11 DIAGNOSIS — M5010 Cervical disc disorder with radiculopathy, unspecified cervical region: Secondary | ICD-10-CM | POA: Insufficient documentation

## 2024-05-15 ENCOUNTER — Other Ambulatory Visit: Payer: Self-pay

## 2024-05-15 ENCOUNTER — Encounter
Admission: RE | Admit: 2024-05-15 | Discharge: 2024-05-15 | Disposition: A | Discharge status: Home / Self Care | Source: Ambulatory Visit | Attending: Neurosurgery | Admitting: Neurosurgery

## 2024-05-15 VITALS — BP 148/91 | HR 99 | Temp 98.1°F | Resp 18 | Ht 71.0 in | Wt 190.0 lb

## 2024-05-15 DIAGNOSIS — Z01810 Encounter for preprocedural cardiovascular examination: Secondary | ICD-10-CM | POA: Diagnosis not present

## 2024-05-15 DIAGNOSIS — Z01818 Encounter for other preprocedural examination: Secondary | ICD-10-CM | POA: Insufficient documentation

## 2024-05-15 DIAGNOSIS — I10 Essential (primary) hypertension: Secondary | ICD-10-CM | POA: Insufficient documentation

## 2024-05-15 DIAGNOSIS — Z01812 Encounter for preprocedural laboratory examination: Secondary | ICD-10-CM

## 2024-05-15 DIAGNOSIS — E119 Type 2 diabetes mellitus without complications: Secondary | ICD-10-CM | POA: Insufficient documentation

## 2024-05-15 HISTORY — DX: Unspecified asthma, uncomplicated: J45.909

## 2024-05-15 LAB — CBC
HCT: 45 % (ref 39.0–52.0)
Hemoglobin: 16.3 g/dL (ref 13.0–17.0)
MCH: 30.9 pg (ref 26.0–34.0)
MCHC: 36.2 g/dL — ABNORMAL HIGH (ref 30.0–36.0)
MCV: 85.4 fL (ref 80.0–100.0)
Platelets: 143 K/uL — ABNORMAL LOW (ref 150–400)
RBC: 5.27 MIL/uL (ref 4.22–5.81)
RDW: 12.8 % (ref 11.5–15.5)
WBC: 12.5 K/uL — ABNORMAL HIGH (ref 4.0–10.5)
nRBC: 0 % (ref 0.0–0.2)

## 2024-05-15 LAB — TYPE AND SCREEN
ABO/RH(D): O POS
Antibody Screen: NEGATIVE
Extend sample reason: TRANSFUSED

## 2024-05-15 LAB — SURGICAL PCR SCREEN
MRSA, PCR: NEGATIVE
Staphylococcus aureus: NEGATIVE

## 2024-05-15 LAB — BASIC METABOLIC PANEL WITH GFR
Anion gap: 14 (ref 5–15)
BUN: 19 mg/dL (ref 6–20)
CO2: 22 mmol/L (ref 22–32)
Calcium: 9.4 mg/dL (ref 8.9–10.3)
Chloride: 99 mmol/L (ref 98–111)
Creatinine, Ser: 0.66 mg/dL (ref 0.61–1.24)
GFR, Estimated: >60 mL/min (ref >60)
Glucose, Bld: 158 mg/dL — ABNORMAL HIGH (ref 70–99)
Potassium: 3.7 mmol/L (ref 3.5–5.1)
Sodium: 135 mmol/L (ref 135–145)

## 2024-05-15 NOTE — Patient Instructions (Addendum)
 Your procedure is scheduled on:    TUESDAY  NOVEMBER 11 Report to the Registration Desk on the 1st floor of the [facility] To find out your arrival time, please call [phone] between 1PM - 3PM on:  MONDAY  NOVEMBER 10  If your arrival time is 6:00 am, do not arrive before that time as the [facility] entrance doors do not open until 6:00 am.  REMEMBER: Instructions that are not followed completely may result in serious medical risk, up to and including death; or upon the discretion of your surgeon and anesthesiologist your surgery may need to be rescheduled.  Do not eat food after midnight the night before surgery.  No gum chewing or hard candies.  You may however, drink WATER up to 2 hours before you are scheduled to arrive for your surgery. Do not drink anything within 2 hours of your scheduled arrival time.   One week prior to surgery:TUESDAY NOVEMBER 4  Stop Anti-inflammatories (NSAIDS) such as Advil, Aleve, Ibuprofen, Motrin, Naproxen, Naprosyn and Aspirin based products such as Excedrin, Goody's Powder, BC Powder. Stop ANY OVER THE COUNTER supplements until after surgery.  You may however, continue to take Tylenol  if needed for pain up until the day of surgery.  **Follow guidelines for insulin and diabetes medications.** dapagliflozin  propanediol (FARXIGA ) hold 3 days prior to surgery, last dose FRIDAY NOVEMBER 7  metFORMIN  (GLUCOPHAGE -XR) hold 2 days prior to surgery. Last dose SATURDAY NOVEMBER 8  tirzepatide  (MOUNJARO ) hold 7 days prior to surgery, last dose MONDAY NOVEMBER 3   Continue taking all of your other prescription medications up until the day of surgery.  ON THE DAY OF SURGERY ONLY TAKE THESE MEDICATIONS WITH SIPS OF WATER:  buPROPion  (WELLBUTRIN  XL)  desvenlafaxine  (PRISTIQ )   No Alcohol for 24 hours before or after surgery.  No Smoking including e-cigarettes for 24 hours before surgery.  No chewable tobacco products for at least 6 hours before  surgery.  No nicotine patches on the day of surgery.  Do not use any recreational drugs for at least a week (preferably 2 weeks) before your surgery.  Please be advised that the combination of cocaine and anesthesia may have negative outcomes, up to and including death. If you test positive for cocaine, your surgery will be cancelled.  On the morning of surgery brush your teeth with toothpaste and water, you may rinse your mouth with mouthwash if you wish. Do not swallow any toothpaste or mouthwash.  Use CHG Soap or wipes as directed on instruction sheet.  Do not wear jewelry, make-up, hairpins, clips or nail polish.  For welded (permanent) jewelry: bracelets, anklets, waist bands, etc.  Please have this removed prior to surgery.  If it is not removed, there is a chance that hospital personnel will need to cut it off on the day of surgery.  Do not wear lotions, powders, or perfumes.   Do not shave body hair from the neck down 48 hours before surgery.  Do not bring valuables to the hospital. [facility] is not responsible for any missing/lost belongings or valuables.   Notify your doctor if there is any change in your medical condition (cold, fever, infection).  Wear comfortable clothing (specific to your surgery type) to the hospital.  After surgery, you can help prevent lung complications by doing breathing exercises.  Take deep breaths and cough every 1-2 hours.  If you are being admitted to the hospital overnight, leave your suitcase in the car. After surgery it may  be brought to your room.  In case of increased patient census, it may be necessary for you, the patient, to continue your postoperative care in the Same Day Surgery department.  If you are being discharged the day of surgery, you will not be allowed to drive home. You will need a responsible individual to drive you home and stay with you for 24 hours after surgery.   If you are taking public transportation, you  will need to have a responsible individual with you.  Please call the Pre-admissions Testing Dept. at [phone] if you have any questions about these instructions.  Surgery Visitation Policy:  Patients having surgery or a procedure may have two visitors.  Children under the age of 86 must have an adult with them who is not the patient.  Inpatient Visitation:    Visiting hours are 7 a.m. to 8 p.m. Up to four visitors are allowed at one time in a patient room. The visitors may rotate out with other people during the day.  One visitor age 58 or older may stay with the patient overnight and must be in the room by 8 p.m.   Merchandiser, Retail to address health-related social needs:  [email or web address]     Pre-operative 4 CHG Bath Instructions   You can play a key role in reducing the risk of infection after surgery. Your skin needs to be as free of germs as possible. You can reduce the number of germs on your skin by washing with CHG (chlorhexidine gluconate) soap before surgery. CHG is an antiseptic soap that kills germs and continues to kill germs even after washing.   DO NOT use if you have an allergy to chlorhexidine/CHG or antibacterial soaps. If your skin becomes reddened or irritated, stop using the CHG and notify one of our RNs at [phone].   Please shower with the CHG soap starting 4 days before surgery using the following schedule:    STARTING FRIDAY NOVEMBER 7     Please keep in mind the following:  DO NOT shave, including legs and underarms, starting the day of your first shower.   You may shave your face at any point before/day of surgery.  Place clean sheets on your bed the day you start using CHG soap. Use a clean washcloth (not used since being washed) for each shower. DO NOT sleep with pets once you start using the CHG.   CHG Shower Instructions:  If you choose to wash your hair and private area, wash first with your normal  shampoo/soap.  After you use shampoo/soap, rinse your hair and body thoroughly to remove shampoo/soap residue.  Turn the water OFF and apply about 3 tablespoons (45 ml) of CHG soap to a CLEAN washcloth.  Apply CHG soap ONLY FROM YOUR NECK DOWN TO YOUR TOES (washing for 3-5 minutes)  DO NOT use CHG soap on face, private areas, open wounds, or sores.  Pay special attention to the area where your surgery is being performed.  If you are having back surgery, having someone wash your back for you may be helpful. Wait 2 minutes after CHG soap is applied, then you may rinse off the CHG soap.  Pat dry with a clean towel  Put on clean clothes/pajamas   If you choose to wear lotion, please use ONLY the CHG-compatible lotions on the back of this paper.     Additional instructions for the day of surgery: DO NOT APPLY any lotions, deodorants, cologne, or perfumes.  Put on clean/comfortable clothes.  Brush your teeth.  Ask your nurse before applying any prescription medications to the skin.      CHG Compatible Lotions   Aveeno Moisturizing lotion  Cetaphil Moisturizing Cream  Cetaphil Moisturizing Lotion  Clairol Herbal Essence Moisturizing Lotion, Dry Skin  Clairol Herbal Essence Moisturizing Lotion, Extra Dry Skin  Clairol Herbal Essence Moisturizing Lotion, Normal Skin  Curel Age Defying Therapeutic Moisturizing Lotion with Alpha Hydroxy  Curel Extreme Care Body Lotion  Curel Soothing Hands Moisturizing Hand Lotion  Curel Therapeutic Moisturizing Cream, Fragrance-Free  Curel Therapeutic Moisturizing Lotion, Fragrance-Free  Curel Therapeutic Moisturizing Lotion, Original Formula  Eucerin Daily Replenishing Lotion  Eucerin Dry Skin Therapy Plus Alpha Hydroxy Crme  Eucerin Dry Skin Therapy Plus Alpha Hydroxy Lotion  Eucerin Original Crme  Eucerin Original Lotion  Eucerin Plus Crme Eucerin Plus Lotion  Eucerin TriLipid Replenishing Lotion  Keri Anti-Bacterial Hand Lotion  Keri Deep  Conditioning Original Lotion Dry Skin Formula Softly Scented  Keri Deep Conditioning Original Lotion, Fragrance Free Sensitive Skin Formula  Keri Lotion Fast Absorbing Fragrance Free Sensitive Skin Formula  Keri Lotion Fast Absorbing Softly Scented Dry Skin Formula  Keri Original Lotion  Keri Skin Renewal Lotion Keri Silky Smooth Lotion  Keri Silky Smooth Sensitive Skin Lotion  Nivea Body Creamy Conditioning Oil  Nivea Body Extra Enriched Teacher, Adult Education Moisturizing Lotion Nivea Crme  Nivea Skin Firming Lotion  NutraDerm 30 Skin Lotion  NutraDerm Skin Lotion  NutraDerm Therapeutic Skin Cream  NutraDerm Therapeutic Skin Lotion  ProShield Protective Hand Cream  Provon moisturizing lotion

## 2024-05-23 ENCOUNTER — Observation Stay
Admission: RE | Admit: 2024-05-23 | Discharge: 2024-05-24 | Disposition: A | Discharge status: Home / Self Care | Attending: Neurosurgery | Admitting: Neurosurgery

## 2024-05-23 ENCOUNTER — Other Ambulatory Visit: Payer: Self-pay

## 2024-05-23 ENCOUNTER — Ambulatory Visit: Payer: Self-pay | Admitting: Urgent Care

## 2024-05-23 ENCOUNTER — Encounter: Payer: Self-pay | Admitting: Neurosurgery

## 2024-05-23 ENCOUNTER — Ambulatory Visit: Admitting: Certified Registered"

## 2024-05-23 ENCOUNTER — Ambulatory Visit

## 2024-05-23 ENCOUNTER — Encounter
Admission: RE | Disposition: A | Discharge status: Home / Self Care | Payer: Self-pay | Source: Home / Self Care | Attending: Neurosurgery

## 2024-05-23 DIAGNOSIS — M50123 Cervical disc disorder at C6-C7 level with radiculopathy: Secondary | ICD-10-CM | POA: Diagnosis not present

## 2024-05-23 DIAGNOSIS — Z4789 Encounter for other orthopedic aftercare: Secondary | ICD-10-CM | POA: Diagnosis not present

## 2024-05-23 DIAGNOSIS — J45909 Unspecified asthma, uncomplicated: Secondary | ICD-10-CM | POA: Insufficient documentation

## 2024-05-23 DIAGNOSIS — M4802 Spinal stenosis, cervical region: Secondary | ICD-10-CM | POA: Insufficient documentation

## 2024-05-23 DIAGNOSIS — I10 Essential (primary) hypertension: Secondary | ICD-10-CM | POA: Insufficient documentation

## 2024-05-23 DIAGNOSIS — E039 Hypothyroidism, unspecified: Secondary | ICD-10-CM | POA: Insufficient documentation

## 2024-05-23 DIAGNOSIS — Z79899 Other long term (current) drug therapy: Secondary | ICD-10-CM | POA: Diagnosis not present

## 2024-05-23 DIAGNOSIS — Z01812 Encounter for preprocedural laboratory examination: Secondary | ICD-10-CM

## 2024-05-23 DIAGNOSIS — M5412 Radiculopathy, cervical region: Principal | ICD-10-CM | POA: Diagnosis present

## 2024-05-23 DIAGNOSIS — M5116 Intervertebral disc disorders with radiculopathy, lumbar region: Principal | ICD-10-CM | POA: Insufficient documentation

## 2024-05-23 DIAGNOSIS — Z7984 Long term (current) use of oral hypoglycemic drugs: Secondary | ICD-10-CM | POA: Diagnosis not present

## 2024-05-23 DIAGNOSIS — Z01818 Encounter for other preprocedural examination: Secondary | ICD-10-CM

## 2024-05-23 DIAGNOSIS — E119 Type 2 diabetes mellitus without complications: Secondary | ICD-10-CM | POA: Diagnosis not present

## 2024-05-23 DIAGNOSIS — M5010 Cervical disc disorder with radiculopathy, unspecified cervical region: Secondary | ICD-10-CM | POA: Insufficient documentation

## 2024-05-23 HISTORY — PX: ANTERIOR CERVICAL DECOMP/DISCECTOMY FUSION: SHX1161

## 2024-05-23 LAB — TYPE AND SCREEN
ABO/RH(D): O POS
Antibody Screen: NEGATIVE

## 2024-05-23 LAB — GLUCOSE, CAPILLARY
Glucose-Capillary: 124 mg/dL — ABNORMAL HIGH (ref 70–99)
Glucose-Capillary: 196 mg/dL — ABNORMAL HIGH (ref 70–99)

## 2024-05-23 SURGERY — ANTERIOR CERVICAL DECOMPRESSION/DISCECTOMY FUSION 2 LEVELS
Anesthesia: General

## 2024-05-23 MED ORDER — KETOROLAC TROMETHAMINE 15 MG/ML IJ SOLN
15.0000 mg | Freq: Four times a day (QID) | INTRAMUSCULAR | Status: DC
  Administered 2024-05-23 – 2024-05-24 (×3): 15 mg via INTRAVENOUS
  Filled 2024-05-23 (×3): qty 1

## 2024-05-23 MED ORDER — CHLORHEXIDINE GLUCONATE 0.12 % MT SOLN
OROMUCOSAL | Status: AC
  Filled 2024-05-23: qty 15

## 2024-05-23 MED ORDER — SODIUM CHLORIDE (PF) 0.9 % IJ SOLN
INTRAMUSCULAR | Status: AC
  Filled 2024-05-23: qty 20

## 2024-05-23 MED ORDER — AMLODIPINE BESYLATE 10 MG PO TABS
10.0000 mg | ORAL_TABLET | Freq: Every day | ORAL | Status: DC
  Administered 2024-05-23 – 2024-05-24 (×2): 10 mg via ORAL
  Filled 2024-05-23 (×2): qty 1

## 2024-05-23 MED ORDER — SUCCINYLCHOLINE CHLORIDE 200 MG/10ML IV SOSY
PREFILLED_SYRINGE | INTRAVENOUS | Status: AC
  Filled 2024-05-23: qty 10

## 2024-05-23 MED ORDER — HYDROCODONE-ACETAMINOPHEN 5-325 MG PO TABS: 2.0000 | ORAL_TABLET | ORAL | Status: DC | PRN

## 2024-05-23 MED ORDER — EPHEDRINE 5 MG/ML INJ
INTRAVENOUS | Status: AC
  Filled 2024-05-23: qty 5

## 2024-05-23 MED ORDER — PHENYLEPHRINE HCL (PRESSORS) 10 MG/ML IV SOLN
INTRAVENOUS | Status: AC
  Filled 2024-05-23: qty 1

## 2024-05-23 MED ORDER — FENTANYL CITRATE (PF) 100 MCG/2ML IJ SOLN
INTRAMUSCULAR | Status: AC
  Filled 2024-05-23: qty 2

## 2024-05-23 MED ORDER — PROPOFOL 10 MG/ML IV BOLUS
INTRAVENOUS | Status: AC
  Filled 2024-05-23: qty 20

## 2024-05-23 MED ORDER — SODIUM CHLORIDE 0.9 % IV SOLN: INTRAVENOUS | Status: DC

## 2024-05-23 MED ORDER — ONDANSETRON HCL 4 MG/2ML IJ SOLN: 4.0000 mg | Freq: Four times a day (QID) | INTRAMUSCULAR | Status: DC | PRN

## 2024-05-23 MED ORDER — VENLAFAXINE HCL ER 150 MG PO CP24
150.0000 mg | ORAL_CAPSULE | Freq: Every day | ORAL | Status: DC
  Administered 2024-05-24: 150 mg via ORAL
  Filled 2024-05-23: qty 1

## 2024-05-23 MED ORDER — LIDOCAINE HCL (CARDIAC) PF 100 MG/5ML IV SOSY
PREFILLED_SYRINGE | INTRAVENOUS | Status: DC | PRN
Start: 2024-05-23 — End: 2024-05-23
  Administered 2024-05-23: 100 mg via INTRAVENOUS

## 2024-05-23 MED ORDER — ONDANSETRON HCL 4 MG/2ML IJ SOLN
INTRAMUSCULAR | Status: AC
  Filled 2024-05-23: qty 2

## 2024-05-23 MED ORDER — KETAMINE HCL 50 MG/5ML IJ SOSY
PREFILLED_SYRINGE | INTRAMUSCULAR | Status: DC | PRN
  Administered 2024-05-23: 30 mg via INTRAVENOUS

## 2024-05-23 MED ORDER — ONDANSETRON HCL 4 MG/2ML IJ SOLN
INTRAMUSCULAR | Status: DC | PRN
  Administered 2024-05-23: 4 mg via INTRAVENOUS

## 2024-05-23 MED ORDER — PHENOL 1.4 % MT LIQD: 1.0000 | OROMUCOSAL | Status: DC | PRN

## 2024-05-23 MED ORDER — MENTHOL 3 MG MT LOZG
1.0000 | LOZENGE | OROMUCOSAL | Status: DC | PRN
  Administered 2024-05-23 – 2024-05-24 (×2): 3 mg via ORAL
  Filled 2024-05-23: qty 9

## 2024-05-23 MED ORDER — PREDNISONE 20 MG PO TABS
20.0000 mg | ORAL_TABLET | Freq: Every day | ORAL | Status: DC
  Administered 2024-05-23 – 2024-05-24 (×2): 20 mg via ORAL
  Filled 2024-05-23 (×2): qty 1

## 2024-05-23 MED ORDER — REMIFENTANIL HCL 1 MG IV SOLR
INTRAVENOUS | Status: AC
  Filled 2024-05-23: qty 1000

## 2024-05-23 MED ORDER — ACETAMINOPHEN 10 MG/ML IV SOLN
INTRAVENOUS | Status: DC | PRN
  Administered 2024-05-23: 1000 mg via INTRAVENOUS

## 2024-05-23 MED ORDER — MIDAZOLAM HCL (PF) 2 MG/2ML IJ SOLN
INTRAMUSCULAR | Status: DC | PRN
  Administered 2024-05-23: 2 mg via INTRAVENOUS

## 2024-05-23 MED ORDER — SUCCINYLCHOLINE CHLORIDE 200 MG/10ML IV SOSY
PREFILLED_SYRINGE | INTRAVENOUS | Status: DC | PRN
  Administered 2024-05-23: 120 mg via INTRAVENOUS

## 2024-05-23 MED ORDER — BENAZEPRIL HCL 20 MG PO TABS
40.0000 mg | ORAL_TABLET | Freq: Every day | ORAL | Status: DC
  Administered 2024-05-23 – 2024-05-24 (×2): 40 mg via ORAL
  Filled 2024-05-23 (×2): qty 2

## 2024-05-23 MED ORDER — SODIUM CHLORIDE 0.9% FLUSH
3.0000 mL | Freq: Two times a day (BID) | INTRAVENOUS | Status: DC
  Administered 2024-05-23 – 2024-05-24 (×2): 3 mL via INTRAVENOUS

## 2024-05-23 MED ORDER — FENTANYL CITRATE (PF) 100 MCG/2ML IJ SOLN
25.0000 ug | INTRAMUSCULAR | Status: DC | PRN
  Administered 2024-05-23: 50 ug via INTRAVENOUS

## 2024-05-23 MED ORDER — SENNA 8.6 MG PO TABS
1.0000 | ORAL_TABLET | Freq: Two times a day (BID) | ORAL | Status: DC
  Administered 2024-05-23 – 2024-05-24 (×2): 8.6 mg via ORAL
  Filled 2024-05-23 (×2): qty 1

## 2024-05-23 MED ORDER — DOCUSATE SODIUM 100 MG PO CAPS
100.0000 mg | ORAL_CAPSULE | Freq: Two times a day (BID) | ORAL | Status: DC
  Administered 2024-05-23 – 2024-05-24 (×2): 100 mg via ORAL
  Filled 2024-05-23 (×2): qty 1

## 2024-05-23 MED ORDER — DROPERIDOL 2.5 MG/ML IJ SOLN
INTRAMUSCULAR | Status: AC
  Filled 2024-05-23: qty 2

## 2024-05-23 MED ORDER — METFORMIN HCL ER 750 MG PO TB24
1500.0000 mg | ORAL_TABLET | Freq: Every day | ORAL | Status: DC
  Administered 2024-05-24: 1500 mg via ORAL
  Filled 2024-05-23: qty 2

## 2024-05-23 MED ORDER — KETAMINE HCL 50 MG/5ML IJ SOSY
PREFILLED_SYRINGE | INTRAMUSCULAR | Status: AC
  Filled 2024-05-23: qty 5

## 2024-05-23 MED ORDER — TAMSULOSIN HCL 0.4 MG PO CAPS
0.4000 mg | ORAL_CAPSULE | Freq: Every day | ORAL | Status: DC
  Administered 2024-05-23 – 2024-05-24 (×2): 0.4 mg via ORAL
  Filled 2024-05-23 (×2): qty 1

## 2024-05-23 MED ORDER — LORATADINE 10 MG PO TABS
10.0000 mg | ORAL_TABLET | Freq: Every day | ORAL | Status: DC
  Administered 2024-05-23 – 2024-05-24 (×2): 10 mg via ORAL
  Filled 2024-05-23 (×2): qty 1

## 2024-05-23 MED ORDER — HYDROMORPHONE HCL 1 MG/ML IJ SOLN
INTRAMUSCULAR | Status: DC | PRN
  Administered 2024-05-23 (×2): .5 mg via INTRAVENOUS

## 2024-05-23 MED ORDER — LIDOCAINE HCL (PF) 2 % IJ SOLN
INTRAMUSCULAR | Status: AC
  Filled 2024-05-23: qty 5

## 2024-05-23 MED ORDER — METHOCARBAMOL 500 MG PO TABS: 500.0000 mg | ORAL_TABLET | Freq: Four times a day (QID) | ORAL | Status: DC | PRN

## 2024-05-23 MED ORDER — CHLORHEXIDINE GLUCONATE 0.12 % MT SOLN
15.0000 mL | Freq: Once | OROMUCOSAL | Status: AC
  Administered 2024-05-23: 15 mL via OROMUCOSAL

## 2024-05-23 MED ORDER — SODIUM CHLORIDE 0.9% FLUSH: 3.0000 mL | INTRAVENOUS | Status: DC | PRN

## 2024-05-23 MED ORDER — HYDROMORPHONE HCL 1 MG/ML IJ SOLN
INTRAMUSCULAR | Status: AC
  Filled 2024-05-23: qty 1

## 2024-05-23 MED ORDER — DEXAMETHASONE SOD PHOSPHATE PF 10 MG/ML IJ SOLN
INTRAMUSCULAR | Status: DC | PRN
  Administered 2024-05-23: 10 mg via INTRAVENOUS

## 2024-05-23 MED ORDER — REMIFENTANIL HCL 1 MG IV SOLR
INTRAVENOUS | Status: DC | PRN
  Administered 2024-05-23: .14 ug/kg/min via INTRAVENOUS

## 2024-05-23 MED ORDER — DEXMEDETOMIDINE HCL IN NACL 80 MCG/20ML IV SOLN
INTRAVENOUS | Status: DC | PRN
  Administered 2024-05-23 (×2): 8 ug via INTRAVENOUS
  Administered 2024-05-23: 4 ug via INTRAVENOUS

## 2024-05-23 MED ORDER — METHOCARBAMOL 1000 MG/10ML IJ SOLN: 500.0000 mg | Freq: Four times a day (QID) | INTRAMUSCULAR | Status: DC | PRN

## 2024-05-23 MED ORDER — DAPAGLIFLOZIN PROPANEDIOL 5 MG PO TABS
5.0000 mg | ORAL_TABLET | Freq: Every day | ORAL | Status: DC
  Administered 2024-05-23 – 2024-05-24 (×2): 5 mg via ORAL
  Filled 2024-05-23 (×2): qty 1

## 2024-05-23 MED ORDER — BUPROPION HCL ER (XL) 150 MG PO TB24
150.0000 mg | ORAL_TABLET | Freq: Every day | ORAL | Status: DC
  Administered 2024-05-23 – 2024-05-24 (×2): 150 mg via ORAL
  Filled 2024-05-23 (×2): qty 1

## 2024-05-23 MED ORDER — CEFAZOLIN SODIUM-DEXTROSE 2-4 GM/100ML-% IV SOLN
2.0000 g | Freq: Once | INTRAVENOUS | Status: AC
  Administered 2024-05-23: 2 g via INTRAVENOUS

## 2024-05-23 MED ORDER — SODIUM CHLORIDE 0.9 % IV SOLN
250.0000 mL | INTRAVENOUS | Status: DC
  Administered 2024-05-23: 250 mL via INTRAVENOUS

## 2024-05-23 MED ORDER — ENOXAPARIN SODIUM 40 MG/0.4ML IJ SOSY
40.0000 mg | PREFILLED_SYRINGE | INTRAMUSCULAR | Status: DC
  Administered 2024-05-24: 40 mg via SUBCUTANEOUS
  Filled 2024-05-23: qty 0.4

## 2024-05-23 MED ORDER — PANTOPRAZOLE SODIUM 40 MG PO TBEC
40.0000 mg | DELAYED_RELEASE_TABLET | Freq: Every day | ORAL | Status: DC
  Administered 2024-05-23 – 2024-05-24 (×2): 40 mg via ORAL
  Filled 2024-05-23 (×2): qty 1

## 2024-05-23 MED ORDER — HYDROMORPHONE HCL 1 MG/ML IJ SOLN: 0.5000 mg | INTRAMUSCULAR | Status: AC | PRN

## 2024-05-23 MED ORDER — PHENYLEPHRINE HCL-NACL 20-0.9 MG/250ML-% IV SOLN
INTRAVENOUS | Status: DC | PRN
  Administered 2024-05-23: 80 ug/min via INTRAVENOUS

## 2024-05-23 MED ORDER — BUPIVACAINE-EPINEPHRINE (PF) 0.5% -1:200000 IJ SOLN
INTRAMUSCULAR | Status: DC | PRN
Start: 2024-05-23 — End: 2024-05-23
  Administered 2024-05-23: 6 mL via PERINEURAL

## 2024-05-23 MED ORDER — SURGIFLO WITH THROMBIN (HEMOSTATIC MATRIX KIT) OPTIME
TOPICAL | Status: DC | PRN
Start: 2024-05-23 — End: 2024-05-23
  Administered 2024-05-23: 1 via TOPICAL

## 2024-05-23 MED ORDER — GLYCOPYRROLATE 0.2 MG/ML IJ SOLN
INTRAMUSCULAR | Status: AC
Start: 2024-05-23 — End: 2024-05-23
  Filled 2024-05-23: qty 1

## 2024-05-23 MED ORDER — 0.9 % SODIUM CHLORIDE (POUR BTL) OPTIME
TOPICAL | Status: DC | PRN
  Administered 2024-05-23: 325 mL

## 2024-05-23 MED ORDER — DROPERIDOL 2.5 MG/ML IJ SOLN
0.6250 mg | Freq: Once | INTRAMUSCULAR | Status: AC
  Administered 2024-05-23: 0.625 mg via INTRAVENOUS

## 2024-05-23 MED ORDER — GLYCOPYRROLATE 0.2 MG/ML IJ SOLN
INTRAMUSCULAR | Status: DC | PRN
  Administered 2024-05-23: .2 mg via INTRAVENOUS

## 2024-05-23 MED ORDER — FENTANYL CITRATE (PF) 100 MCG/2ML IJ SOLN
INTRAMUSCULAR | Status: DC | PRN
  Administered 2024-05-23: 100 ug via INTRAVENOUS

## 2024-05-23 MED ORDER — ORAL CARE MOUTH RINSE: 15.0000 mL | Freq: Once | OROMUCOSAL | Status: AC

## 2024-05-23 MED ORDER — PROPOFOL 10 MG/ML IV BOLUS
INTRAVENOUS | Status: DC | PRN
Start: 2024-05-23 — End: 2024-05-23
  Administered 2024-05-23: 50 mg via INTRAVENOUS
  Administered 2024-05-23: 100 mg via INTRAVENOUS

## 2024-05-23 MED ORDER — LEVOTHYROXINE SODIUM 50 MCG PO TABS
175.0000 ug | ORAL_TABLET | Freq: Every day | ORAL | Status: DC
  Administered 2024-05-24: 175 ug via ORAL
  Filled 2024-05-23: qty 1

## 2024-05-23 MED ORDER — SORBITOL 70 % SOLN: 30.0000 mL | Freq: Every day | Status: DC | PRN

## 2024-05-23 MED ORDER — CEFAZOLIN SODIUM-DEXTROSE 2-4 GM/100ML-% IV SOLN
INTRAVENOUS | Status: AC
  Filled 2024-05-23: qty 100

## 2024-05-23 MED ORDER — ONDANSETRON HCL 4 MG PO TABS: 4.0000 mg | ORAL_TABLET | Freq: Four times a day (QID) | ORAL | Status: DC | PRN

## 2024-05-23 MED ORDER — ACETAMINOPHEN 10 MG/ML IV SOLN
INTRAVENOUS | Status: AC
  Filled 2024-05-23: qty 100

## 2024-05-23 MED ORDER — HYDROCODONE-ACETAMINOPHEN 5-325 MG PO TABS
ORAL_TABLET | ORAL | Status: AC
  Filled 2024-05-23: qty 1

## 2024-05-23 MED ORDER — POLYETHYLENE GLYCOL 3350 17 G PO PACK: 17.0000 g | PACK | Freq: Every day | ORAL | Status: DC | PRN

## 2024-05-23 MED ORDER — MAGNESIUM CITRATE PO SOLN: 1.0000 | Freq: Once | ORAL | Status: DC | PRN

## 2024-05-23 MED ORDER — EPHEDRINE SULFATE-NACL 50-0.9 MG/10ML-% IV SOSY
PREFILLED_SYRINGE | INTRAVENOUS | Status: DC | PRN
  Administered 2024-05-23: 20 mg via INTRAVENOUS

## 2024-05-23 MED ORDER — MIDAZOLAM HCL 2 MG/2ML IJ SOLN
INTRAMUSCULAR | Status: AC
  Filled 2024-05-23: qty 2

## 2024-05-23 MED ORDER — HYDROCODONE-ACETAMINOPHEN 5-325 MG PO TABS
1.0000 | ORAL_TABLET | ORAL | Status: DC | PRN
  Administered 2024-05-23: 1 via ORAL

## 2024-05-23 SURGICAL SUPPLY — 33 items
BASIN KIT SINGLE STR (MISCELLANEOUS) ×1 IMPLANT
BIT DRILL ACP 13 (DRILL) IMPLANT
BRUSH SCRUB EZ 4% CHG (MISCELLANEOUS) ×1 IMPLANT
BUR NEURO DRILL SOFT 3.0X3.8M (BURR) ×1 IMPLANT
DERMABOND ADVANCED .7 DNX12 (GAUZE/BANDAGES/DRESSINGS) ×1 IMPLANT
DRAIN CHANNEL JP 10F RND 20C F (MISCELLANEOUS) IMPLANT
DRAPE C-ARM XRAY 36X54 (DRAPES) ×2 IMPLANT
DRAPE LAPAROTOMY 77X122 PED (DRAPES) ×1 IMPLANT
DRSG TEGADERM 2-3/8X2-3/4 SM (GAUZE/BANDAGES/DRESSINGS) IMPLANT
ELECTRODE REM PT RTRN 9FT ADLT (ELECTROSURGICAL) ×1 IMPLANT
EVACUATOR SILICONE 100CC (DRAIN) IMPLANT
GLOVE BIOGEL PI IND STRL 7.0 (GLOVE) ×1 IMPLANT
GLOVE BIOGEL PI IND STRL 8 (GLOVE) ×1 IMPLANT
GLOVE SURG SYN 7.0 PF PI (GLOVE) ×1 IMPLANT
GLOVE SURG SYN 7.5 PF PI (GLOVE) ×1 IMPLANT
GOWN SRG XL LVL 3 NONREINFORCE (GOWNS) ×1 IMPLANT
GOWN STRL REUS W/ TWL XL LVL3 (GOWN DISPOSABLE) ×1 IMPLANT
KIT TURNOVER KIT A (KITS) ×1 IMPLANT
MANIFOLD NEPTUNE II (INSTRUMENTS) ×1 IMPLANT
NS IRRIG 500ML POUR BTL (IV SOLUTION) ×1 IMPLANT
PACK LAMINECTOMY ARMC (PACKS) ×1 IMPLANT
PAD ARMBOARD POSITIONER FOAM (MISCELLANEOUS) ×2 IMPLANT
PIN CASPAR 14 (PIN) ×1 IMPLANT
PLATE ACP 1.6X36 2LVL (Plate) IMPLANT
SCREW ACP 3.5X17 (Screw) IMPLANT
SPACER CERVICAL FRGE 12X14X6-7 (Spacer) IMPLANT
SPONGE KITTNER 5P (MISCELLANEOUS) ×1 IMPLANT
SURGIFLO W/THROMBIN 8M KIT (HEMOSTASIS) ×1 IMPLANT
SUT STRATA 3-0 30 PS-1 (SUTURE) IMPLANT
SUT VIC AB 3-0 SH 8-18 (SUTURE) ×1 IMPLANT
SYR 20ML LL LF (SYRINGE) ×1 IMPLANT
TAPE CLOTH 3X10 WHT NS LF (GAUZE/BANDAGES/DRESSINGS) ×2 IMPLANT
TRAP FLUID SMOKE EVACUATOR (MISCELLANEOUS) ×1 IMPLANT

## 2024-05-23 NOTE — Anesthesia Preprocedure Evaluation (Signed)
 Anesthesia Evaluation  Patient identified by MRN, date of birth, ID band Patient awake    Reviewed: Allergy & Precautions, NPO status , Patient's Chart, lab work & pertinent test results  Airway Mallampati: III  TM Distance: <3 FB Neck ROM: full    Dental  (+) Chipped   Pulmonary neg shortness of breath, asthma    Pulmonary exam normal        Cardiovascular Exercise Tolerance: Good hypertension, (-) angina (-) Past MI and (-) DOE Normal cardiovascular exam     Neuro/Psych  Neuromuscular disease  negative psych ROS   GI/Hepatic Neg liver ROS,GERD  Controlled,,  Endo/Other  diabetes, Type 2Hypothyroidism    Renal/GU      Musculoskeletal   Abdominal   Peds  Hematology negative hematology ROS (+)   Anesthesia Other Findings Past Medical History: 12/24/2017: Allergic rhinitis No date: Allergy No date: Alopecia totalis 12/24/2017: Anxiety No date: Chicken pox No date: Childhood asthma 10/13/2020: Elevated liver enzymes 02/10/2013: Essential hypertension 04/23/2021: Eyelid abnormality 01/27/2019: GERD (gastroesophageal reflux disease) 06/26/2019: Hypogonadism in male 02/10/2013: Hypothyroidism 12/24/2017: Lower urinary tract symptoms (LUTS) 06/20/2020: Suspected COVID-19 virus infection 03/21/2018: T2DM (type 2 diabetes mellitus) (HCC) No date: Vitiligo  Past Surgical History: No date: CARPAL TUNNEL RELEASE     Comment:  Bil 1994: NASAL SINUS SURGERY     Comment:  cut and open air passages/nostrils     Reproductive/Obstetrics negative OB ROS                              Anesthesia Physical Anesthesia Plan  ASA: 3  Anesthesia Plan: General ETT   Post-op Pain Management:    Induction: Intravenous  PONV Risk Score and Plan: Ondansetron , Dexamethasone, Midazolam and Treatment may vary due to age or medical condition  Airway Management Planned: Oral ETT  Additional  Equipment:   Intra-op Plan:   Post-operative Plan: Extubation in OR  Informed Consent: I have reviewed the patients History and Physical, chart, labs and discussed the procedure including the risks, benefits and alternatives for the proposed anesthesia with the patient or authorized representative who has indicated his/her understanding and acceptance.     Dental Advisory Given  Plan Discussed with: Anesthesiologist, CRNA and Surgeon  Anesthesia Plan Comments: (Patient consented for risks of anesthesia including but not limited to:  - adverse reactions to medications - damage to eyes, teeth, lips or other oral mucosa - nerve damage due to positioning  - sore throat or hoarseness - Damage to heart, brain, nerves, lungs, other parts of body or loss of life  Patient voiced understanding and assent.)        Anesthesia Quick Evaluation

## 2024-05-23 NOTE — Progress Notes (Signed)
Referring Physician:  No referring provider defined for this encounter.  Primary Physician:  Alvia Bring, DO  History of Present Illness: 05/23/2024 [name] is here today with a chief complaint of bilateral cervical radiculopathy.  He has bilateral neck pain that radiates up to the back of his head and down the bilateral arms.  On the left it radiates down his neck to the posterior lateral aspect of his arm down to the back of his hand.  It also can get somewhat of his lateral aspect of his hand and forearm.  On the right side he feels the pain radiating from his neck predominantly down the lateral aspect of his distal arm into the lateral aspect of his hand and to a lesser degree down the posterior aspect of his arm.  He has had severe weakness in his left hand right upper extremity.  His left upper extremity has significant wrist drop and tricep weakness.  He feels weakness on the right side mostly in his hand grip but it continues to be much stronger than his left.  He does have a history of myasthenia gravis which is notably mostly in his eyes currently.  He continues to try to be physically active but has had difficulty lifting weights with the left arm predominantly given his weakness.  He has had previous physical therapy.  He has not had any epidural steroid injections.  His predominant issues are his weakness.  He does have some neck pain and posterior cervical genic headaches.  Conservative measures:  Physical therapy:  has participated in with [facility] therapy including regular antiinflammatories: not for the current issue Injections: no epidural steroid injections  Past Surgery: no spinal surgeries  The symptoms are causing a significant impact on the patient's life.   I have utilized the care everywhere function in epic to review the outside records available from external health systems.  Review of Systems:  A 10 point review of systems is  negative, except for the pertinent positives and negatives detailed in the HPI.  Past Medical History: Past Medical History:  Diagnosis Date   Allergic rhinitis 12/24/2017   Allergy    Alopecia totalis    Anxiety 12/24/2017   Chicken pox    Childhood asthma    Elevated liver enzymes 10/13/2020   Essential hypertension 02/10/2013   Eyelid abnormality 04/23/2021   GERD (gastroesophageal reflux disease) 01/27/2019   Hypogonadism in male 06/26/2019   Hypothyroidism 02/10/2013   Lower urinary tract symptoms (LUTS) 12/24/2017   Suspected COVID-19 virus infection 06/20/2020   T2DM (type 2 diabetes mellitus) (HCC) 03/21/2018   Vitiligo     Past Surgical History: Past Surgical History:  Procedure Laterality Date   CARPAL TUNNEL RELEASE     Bil   NASAL SINUS SURGERY  1994   cut and open air passages/nostrils    Allergies: Allergies as of 04/10/2024   (No Known Allergies)    Medications:  Current Facility-Administered Medications:    0.9 %  sodium chloride  infusion, , Intravenous, Continuous, Dario Barter, MD, Last Rate: 10 mL/hr at 05/23/24 1425, New Bag at 05/23/24 1425   ceFAZolin (ANCEF) IVPB 2g/100 mL premix, 2 g, Intravenous, Once, Claudene Penne ORN, MD  Social History: Social History   Tobacco Use   Smoking status: Never   Smokeless tobacco: Never  Vaping Use   Vaping status: Never Used  Substance Use Topics   Alcohol use: Not Currently   Drug use: Not Currently  Family Medical History: Family History  Problem Relation Age of Onset   Diabetes Father    Hypertension Father    Diabetes Paternal Aunt    Cancer Maternal Aunt        breast   Diabetes Sister    Hypertension Sister    Diabetes Brother    Colon cancer Neg Hx    Colon polyps Neg Hx    Esophageal cancer Neg Hx    Stomach cancer Neg Hx    Rectal cancer Neg Hx     Physical Examination: Vitals:   05/23/24 1253  BP: (!) 135/98  Pulse: (!) 109  Resp: 15  Temp: 97.8 F (36.6 C)  SpO2:  99%    General: Patient is in no apparent distress. Attention to examination is appropriate.  Neck:   Supple.  Full range of motion.  Respiratory: Patient is breathing without any difficulty.   NEUROLOGICAL:     Awake, alert, oriented to person, place, and time.  Speech is clear and fluent.   Cranial Nerves: Pupils equal round and reactive to light.  Facial tone is symmetric.  Facial sensation is symmetric. Shoulder shrug is symmetric. Tongue protrusion is midline.    Strength: Side Biceps Triceps Deltoid Interossei Grip Wrist Ext. Wrist Flex.  R 5 4+ 5 5 4+ 4+ 5  L 5 4- 5 5 4- 3 5   Decreased reflexes in the bilateral triceps compared to the bilateral biceps.  Sensation decreased on the left and a C6-7 distribution worse than C7.  On the right in a C6-7 distribution worse in C6.  Imaging: Narrative & Impression  CLINICAL DATA:  Initial evaluation for arm weakness.   EXAM: MRI CERVICAL SPINE WITHOUT CONTRAST   TECHNIQUE: Multiplanar, multisequence MR imaging of the cervical spine was performed. No intravenous contrast was administered.   COMPARISON:  None Available.   FINDINGS: Alignment: Straightening of the normal cervical lordosis. No listhesis.   Vertebrae: Vertebral body height maintained without acute or chronic fracture. Bone marrow signal intensity within normal limits. No worrisome osseous lesions or abnormal marrow edema.   Cord: Normal signal and morphology.   Posterior Fossa, vertebral arteries, paraspinal tissues: Unremarkable.   Disc levels:   C2-C3: Small central disc protrusion minimally indents the ventral thecal sac. Mild left-sided facet hypertrophy. No canal or foraminal stenosis.   C3-C4: Small central disc protrusion mildly indents the ventral thecal sac. No spinal stenosis. Left-sided uncovertebral spurring with mild left C4 foraminal stenosis. Right neural foramina remains patent.   C4-C5: Right paracentral disc protrusion indents  the right ventral thecal sac (series 5, image 13). Minimal flattening of the right ventral cord without cord signal changes. Mild spinal stenosis. Uncovertebral spurring with moderate left C5 foraminal stenosis. Right neural foramina remains patent.   C5-C6: Right paracentral disc osteophyte complex indents the right ventral thecal sac (series 5, image 17). Flattening of the right hemi cord without cord signal changes. Mild spinal stenosis. Uncovertebral spurring with moderate right and mild left C6 foraminal stenosis.   C6-C7: Right eccentric disc osteophyte complex. Flattening of the ventral thecal sac with resultant mild spinal stenosis. Severe right with moderate left C7 foraminal stenosis.   C7-T1: Minimal annular disc bulge. Mild facet hypertrophy. No canal or foraminal stenosis.   IMPRESSION: 1. Multilevel cervical spondylosis with resultant mild diffuse spinal stenosis at C4-5 through C6-7. 2. Multifactorial degenerative changes with resultant multilevel foraminal narrowing as above. Notable findings include moderate left C5 and right C6 foraminal stenosis, with severe  right and moderate left C7 foraminal narrowing.     Electronically Signed   By: Morene Hoard M.D.   On: 01/08/2024 15:58   I have personally reviewed the images and agree with the above interpretation.  It appears to me that he has significant spondylosis at the C5-6 level and C6-7 level.  Although these findings do appear worse on the right they are still prominent on the left.  The C5-6 level shows a disc herniation/osteophytic complex with flattening of the hemicord but moderate foraminal stenosis, on the left shows more mild foraminal stenosis.  On the C6/7 level severe on the right and moderate on the left  [facility]  [address] [place], [address]  [place], [place] [address] Tel: [phone] Fax: [phone] Test Date:  09/30/2023   Patient: Mark Black  [facility] DOB: September 01, 1965 Physician: Tonita Blanch, DO  Sex: Male Height: 5' 11 Ref Phys: Tonita Blanch, DO  ID#: [id number]     Technician:      History: This is a 58 year old man referred for evaluation of bilateral hand weakness.   NCV & EMG Findings: Extensive electrodiagnostic testing of the right upper extremity and additional studies of the left shows:  Bilateral median, bilateral ulnar, and left mixed palmar sensory responses are within normal limits.  Right mixed palmar sensory response shows prolonged latency. Bilateral median and ulnar motor responses are within normal limits. Chronic motor axonal loss changes are seen affecting C7 myotome bilaterally, without accompanied active denervation.   Impression: Chronic C7 radiculopathy affecting bilateral upper extremities, mild. Right median neuropathy at or distal to the wrist, consistent with a clinical diagnosis of carpal tunnel syndrome.  Overall, these findings are very mild in degree electrically.     ___________________________ Tonita Blanch, DO   Medical Decision Making/Assessment and Plan: Mr. Stairs is a pleasant 58 y.o. male with significant severe cervical radiculopathy.  He does have a history of myasthenia gravis, this is mostly located in his eyes at this point.  He has been evaluated for motor deficits and neck pain with cervicalgia and radiculopathy.  He was evaluated by Dr. Blanch who performed an EMG diagnosing him with significant bilateral C7 radiculopathy.  Clinically he shows symptoms consistent with a C6 and C7 radiculopathy, it is appears to be C7 predominant on the left and C6 predominant on the right but is present bilaterally.  Overall the left side is worse on the right side.  He has motor weakness of 4 - in the left tricep and 3 and left wrist extension.  His right side shows 4+ out of 5 in the right tricep and 4+ out of 5 in the right wrist extension.  He has sensory deficits noted in the C6-7 distribution  bilaterally and decreased reflexes.  His imaging does show significant cervical spondylosis at C6-7 and C5-6 bilaterally, imaging appears to be worse on the right than on the left but it does show significant stenosis on the left as well.  These levels seem to correlate well with his clinical symptomatology as well as with his positive EMG diagnosing him with a bilateral C7 radiculopathy.  Given his motion at C5-6 we will plan for an anterior cervical discectomy and fusion at C5-C7 for decompression of the C6 nerve roots bilaterally as well as the C7 nerve roots bilaterally and in order to address his C5-6 listhesis patient would like to go forward with the surgery.  Risk benefits were discussed  Thank you for involving me  in the care of this patient.    Penne MICAEL Sharps MD/MSCR Neurosurgery

## 2024-05-23 NOTE — H&P (View-Only) (Signed)
Referring Physician:  No referring provider defined for this encounter.  Primary Physician:  Alvia Bring, DO  History of Present Illness: 05/23/2024 [name] is here today with a chief complaint of bilateral cervical radiculopathy.  He has bilateral neck pain that radiates up to the back of his head and down the bilateral arms.  On the left it radiates down his neck to the posterior lateral aspect of his arm down to the back of his hand.  It also can get somewhat of his lateral aspect of his hand and forearm.  On the right side he feels the pain radiating from his neck predominantly down the lateral aspect of his distal arm into the lateral aspect of his hand and to a lesser degree down the posterior aspect of his arm.  He has had severe weakness in his left hand right upper extremity.  His left upper extremity has significant wrist drop and tricep weakness.  He feels weakness on the right side mostly in his hand grip but it continues to be much stronger than his left.  He does have a history of myasthenia gravis which is notably mostly in his eyes currently.  He continues to try to be physically active but has had difficulty lifting weights with the left arm predominantly given his weakness.  He has had previous physical therapy.  He has not had any epidural steroid injections.  His predominant issues are his weakness.  He does have some neck pain and posterior cervical genic headaches.  Conservative measures:  Physical therapy:  has participated in with [facility] therapy including regular antiinflammatories: not for the current issue Injections: no epidural steroid injections  Past Surgery: no spinal surgeries  The symptoms are causing a significant impact on the patient's life.   I have utilized the care everywhere function in epic to review the outside records available from external health systems.  Review of Systems:  A 10 point review of systems is  negative, except for the pertinent positives and negatives detailed in the HPI.  Past Medical History: Past Medical History:  Diagnosis Date   Allergic rhinitis 12/24/2017   Allergy    Alopecia totalis    Anxiety 12/24/2017   Chicken pox    Childhood asthma    Elevated liver enzymes 10/13/2020   Essential hypertension 02/10/2013   Eyelid abnormality 04/23/2021   GERD (gastroesophageal reflux disease) 01/27/2019   Hypogonadism in male 06/26/2019   Hypothyroidism 02/10/2013   Lower urinary tract symptoms (LUTS) 12/24/2017   Suspected COVID-19 virus infection 06/20/2020   T2DM (type 2 diabetes mellitus) (HCC) 03/21/2018   Vitiligo     Past Surgical History: Past Surgical History:  Procedure Laterality Date   CARPAL TUNNEL RELEASE     Bil   NASAL SINUS SURGERY  1994   cut and open air passages/nostrils    Allergies: Allergies as of 04/10/2024   (No Known Allergies)    Medications:  Current Facility-Administered Medications:    0.9 %  sodium chloride  infusion, , Intravenous, Continuous, Dario Barter, MD, Last Rate: 10 mL/hr at 05/23/24 1425, New Bag at 05/23/24 1425   ceFAZolin (ANCEF) IVPB 2g/100 mL premix, 2 g, Intravenous, Once, Claudene Penne ORN, MD  Social History: Social History   Tobacco Use   Smoking status: Never   Smokeless tobacco: Never  Vaping Use   Vaping status: Never Used  Substance Use Topics   Alcohol use: Not Currently   Drug use: Not Currently  Family Medical History: Family History  Problem Relation Age of Onset   Diabetes Father    Hypertension Father    Diabetes Paternal Aunt    Cancer Maternal Aunt        breast   Diabetes Sister    Hypertension Sister    Diabetes Brother    Colon cancer Neg Hx    Colon polyps Neg Hx    Esophageal cancer Neg Hx    Stomach cancer Neg Hx    Rectal cancer Neg Hx     Physical Examination: Vitals:   05/23/24 1253  BP: (!) 135/98  Pulse: (!) 109  Resp: 15  Temp: 97.8 F (36.6 C)  SpO2:  99%    General: Patient is in no apparent distress. Attention to examination is appropriate.  Neck:   Supple.  Full range of motion.  Respiratory: Patient is breathing without any difficulty.   NEUROLOGICAL:     Awake, alert, oriented to person, place, and time.  Speech is clear and fluent.   Cranial Nerves: Pupils equal round and reactive to light.  Facial tone is symmetric.  Facial sensation is symmetric. Shoulder shrug is symmetric. Tongue protrusion is midline.    Strength: Side Biceps Triceps Deltoid Interossei Grip Wrist Ext. Wrist Flex.  R 5 4+ 5 5 4+ 4+ 5  L 5 4- 5 5 4- 3 5   Decreased reflexes in the bilateral triceps compared to the bilateral biceps.  Sensation decreased on the left and a C6-7 distribution worse than C7.  On the right in a C6-7 distribution worse in C6.  Imaging: Narrative & Impression  CLINICAL DATA:  Initial evaluation for arm weakness.   EXAM: MRI CERVICAL SPINE WITHOUT CONTRAST   TECHNIQUE: Multiplanar, multisequence MR imaging of the cervical spine was performed. No intravenous contrast was administered.   COMPARISON:  None Available.   FINDINGS: Alignment: Straightening of the normal cervical lordosis. No listhesis.   Vertebrae: Vertebral body height maintained without acute or chronic fracture. Bone marrow signal intensity within normal limits. No worrisome osseous lesions or abnormal marrow edema.   Cord: Normal signal and morphology.   Posterior Fossa, vertebral arteries, paraspinal tissues: Unremarkable.   Disc levels:   C2-C3: Small central disc protrusion minimally indents the ventral thecal sac. Mild left-sided facet hypertrophy. No canal or foraminal stenosis.   C3-C4: Small central disc protrusion mildly indents the ventral thecal sac. No spinal stenosis. Left-sided uncovertebral spurring with mild left C4 foraminal stenosis. Right neural foramina remains patent.   C4-C5: Right paracentral disc protrusion indents  the right ventral thecal sac (series 5, image 13). Minimal flattening of the right ventral cord without cord signal changes. Mild spinal stenosis. Uncovertebral spurring with moderate left C5 foraminal stenosis. Right neural foramina remains patent.   C5-C6: Right paracentral disc osteophyte complex indents the right ventral thecal sac (series 5, image 17). Flattening of the right hemi cord without cord signal changes. Mild spinal stenosis. Uncovertebral spurring with moderate right and mild left C6 foraminal stenosis.   C6-C7: Right eccentric disc osteophyte complex. Flattening of the ventral thecal sac with resultant mild spinal stenosis. Severe right with moderate left C7 foraminal stenosis.   C7-T1: Minimal annular disc bulge. Mild facet hypertrophy. No canal or foraminal stenosis.   IMPRESSION: 1. Multilevel cervical spondylosis with resultant mild diffuse spinal stenosis at C4-5 through C6-7. 2. Multifactorial degenerative changes with resultant multilevel foraminal narrowing as above. Notable findings include moderate left C5 and right C6 foraminal stenosis, with severe  right and moderate left C7 foraminal narrowing.     Electronically Signed   By: Morene Hoard M.D.   On: 01/08/2024 15:58   I have personally reviewed the images and agree with the above interpretation.  It appears to me that he has significant spondylosis at the C5-6 level and C6-7 level.  Although these findings do appear worse on the right they are still prominent on the left.  The C5-6 level shows a disc herniation/osteophytic complex with flattening of the hemicord but moderate foraminal stenosis, on the left shows more mild foraminal stenosis.  On the C6/7 level severe on the right and moderate on the left  [facility]  [address] [place], [address]  [place], [place] [address] Tel: [phone] Fax: [phone] Test Date:  09/30/2023   Patient: Deanglo Hissong  [facility] DOB: 06-30-1966 Physician: Tonita Blanch, DO  Sex: Male Height: 5' 11 Ref Phys: Tonita Blanch, DO  ID#: [id number]     Technician:      History: This is a 58 year old man referred for evaluation of bilateral hand weakness.   NCV & EMG Findings: Extensive electrodiagnostic testing of the right upper extremity and additional studies of the left shows:  Bilateral median, bilateral ulnar, and left mixed palmar sensory responses are within normal limits.  Right mixed palmar sensory response shows prolonged latency. Bilateral median and ulnar motor responses are within normal limits. Chronic motor axonal loss changes are seen affecting C7 myotome bilaterally, without accompanied active denervation.   Impression: Chronic C7 radiculopathy affecting bilateral upper extremities, mild. Right median neuropathy at or distal to the wrist, consistent with a clinical diagnosis of carpal tunnel syndrome.  Overall, these findings are very mild in degree electrically.     ___________________________ Tonita Blanch, DO   Medical Decision Making/Assessment and Plan: Mr. Hunger is a pleasant 58 y.o. male with significant severe cervical radiculopathy.  He does have a history of myasthenia gravis, this is mostly located in his eyes at this point.  He has been evaluated for motor deficits and neck pain with cervicalgia and radiculopathy.  He was evaluated by Dr. Blanch who performed an EMG diagnosing him with significant bilateral C7 radiculopathy.  Clinically he shows symptoms consistent with a C6 and C7 radiculopathy, it is appears to be C7 predominant on the left and C6 predominant on the right but is present bilaterally.  Overall the left side is worse on the right side.  He has motor weakness of 4 - in the left tricep and 3 and left wrist extension.  His right side shows 4+ out of 5 in the right tricep and 4+ out of 5 in the right wrist extension.  He has sensory deficits noted in the C6-7 distribution  bilaterally and decreased reflexes.  His imaging does show significant cervical spondylosis at C6-7 and C5-6 bilaterally, imaging appears to be worse on the right than on the left but it does show significant stenosis on the left as well.  These levels seem to correlate well with his clinical symptomatology as well as with his positive EMG diagnosing him with a bilateral C7 radiculopathy.  Given his motion at C5-6 we will plan for an anterior cervical discectomy and fusion at C5-C7 for decompression of the C6 nerve roots bilaterally as well as the C7 nerve roots bilaterally and in order to address his C5-6 listhesis patient would like to go forward with the surgery.  Risk benefits were discussed  Thank you for involving me  in the care of this patient.    Penne MICAEL Sharps MD/MSCR Neurosurgery

## 2024-05-23 NOTE — Anesthesia Procedure Notes (Signed)
 Procedure Name: Intubation Date/Time: 05/23/2024 2:55 PM  Performed by: Jackye Spanner, CRNAPre-anesthesia Checklist: Patient identified, Patient being monitored, Timeout performed, Emergency Drugs available and Suction available Patient Re-evaluated:Patient Re-evaluated prior to induction Oxygen Delivery Method: Circle system utilized Preoxygenation: Pre-oxygenation with 100% oxygen Induction Type: IV induction Ventilation: Mask ventilation without difficulty Laryngoscope Size: McGrath and 4 Grade View: Grade I Tube type: Oral Tube size: 7.5 mm Number of attempts: 1 Airway Equipment and Method: Stylet Placement Confirmation: ETT inserted through vocal cords under direct vision, positive ETCO2 and breath sounds checked- equal and bilateral Secured at: 22 cm Tube secured with: Tape Dental Injury: Teeth and Oropharynx as per pre-operative assessment  Comments: Smooth atraumatic intubation, no complications noted.

## 2024-05-23 NOTE — Op Note (Signed)
 Indications: 58 year old man with a history of progressive cervical radiculopathy.  He was found to have motion at C5-6 with a C6 predominant radiculopathy as well as severe stenosis at the C6-7 with C7 radiculopathy found on EMG nerve conduction study.  Given his continued symptoms and worsening pain control and function despite conservative measures was recommended for a anterior cervical discectomy and fusion at C5-C7  Findings: Severe foraminal stenosis disc collapse and spondylosis at C5-6 and C6-7.  Well decompressed at the end of the procedure.    Preoperative Diagnosis: Right sided C6-7 radiculopathy secondary to disc herniations and foraminal stenosis  Postoperative Diagnosis: Right sided C6-7 radiculopathy secondary to disc herniations and foraminal stenosis   EBL: 25 mL IVF: See anesthesia report Drains: none Disposition: Extubated and Stable to PACU Complications: none  No foley catheter was placed.   Preoperative Note:   Risks of surgery discussed include: infection, bleeding, stroke, coma, death, paralysis, CSF leak, nerve/spinal cord injury, numbness, tingling, weakness, complex regional pain syndrome, recurrent stenosis and/or disc herniation, vascular injury, development of instability, neck/back pain, need for further surgery, persistent symptoms, development of deformity, and the risks of anesthesia. The patient understood these risks and agreed to proceed.  Procedure:  1) Anterior cervical diskectomy and fusion at C5-7 2) Anterior cervical instrumentation at C5-7 3) Structural allograft consisting of corticocancellous allograft   Procedure: After obtaining informed consent, the patient taken to the operating room, placed in supine position, general anesthesia induced.  The patient had a small shoulder roll placed behind their shoulders.  The patient received preop antibiotics and IV Decadron.  The patient had a neck incision outlined, was prepped and draped in usual  sterile fashion. The incision was injected with local anesthetic.   An incision was opened, dissection taken down medial to the carotid artery and jugular vein, lateral to the trachea and esophagus.  The prevertebral fascia identified and a localizing x-ray demonstrated the correct level.  The longus colli were dissected laterally, and self-retaining retractors placed to open the operative field. The microscope was then brought into the field.  With this complete, distractor pins were placed in the vertebral bodies of C5 and C6. The distractor was placed, and the annulus at C5/6 was opened using a bovie.  Curettes and pituitary rongeurs used to remove the majority of disk, then the drill was used to remove the posterior osteophyte and begin the foraminotomies. The nerve hook was used to elevate the posterior longitudinal ligament, which was then removed with Kerrison rongeurs. The microblunt nerve hook could be passed out the foramen bilaterally.   Meticulous hemostasis obtained.  Structural allograft was tapped behind the anterior lip of the vertebral body at C5/6 (6 mm).    With this complete, distractor pins were placed in the vertebral bodies of C6 and C7. The distractor was placed, and the annulus at C6/7 was opened using a bovie.  Curettes and pituitary rongeurs used to remove the majority of disk, then the drill was used to remove the posterior osteophyte and begin the foraminotomies. The nerve hook was used to elevate the posterior longitudinal ligament, which was then removed with Kerrison rongeurs. The microblunt nerve hook could be passed out the foramen bilaterally.   Meticulous hemostasis obtained.  Structural allograft was tapped behind the anterior lip of the vertebral body at C6/7 (6 mm).    The caspar distractor was removed, and bone wax used for hemostasis. A separate, 36 mm plate was chosen.  Two screws placed in each  vertebral body, respectively making sure the screws were behind the  locking mechanism.  Final AP and lateral radiographs were taken.   With everything in good position, the wound was irrigated copiously and meticulous hemostasis obtained.  Wound was closed in 2 layers using interrupted inverted 3-0 Vicryl sutures.  The wound was dressed with dermabond, the head of bed at 30 degrees, taken to recovery room in stable condition.  No new postop neurological deficits were identified.  Sponge and pattie counts were correct at the end of the procedure.    I performed the procedure with the assistance of Edsel Goods PA-C, they aided in the exposure, retraction of critical structures, visualization of critical structures, stabilization during instrumentation, and closure.  Implants: Implant Name Type Inv. Item Serial No. Manufacturer Lot No. LRB No. Used Action  SPACER CERVICAL FRGE [id number] - [id number] Spacer SPACER CERVICAL FRGE [id number] [employer] [id number] DONOR N/A 1 Implanted  SPACER CERVICAL FRGE [id number] - [id number] Spacer SPACER CERVICAL FRGE [id number] [facility] MEDICAL [id number] DONOR N/A 1 Implanted  PLATE ACP 8.3K63 2LVL - [id number] Plate PLATE ACP 8.3K63 2LVL  [employer]  N/A 1 Implanted  SCREW ACP 3.5X17 - [id number] Screw SCREW ACP 3.5X17  [employer]  N/A 6 Implanted      Penne MICAEL Sharps, MD/MSCR

## 2024-05-23 NOTE — Interval H&P Note (Signed)
History and Physical Interval Note:  05/23/2024 2:28 PM  Mark Black  has presented today for surgery, with the diagnosis of Right sided C6-7 radiculopathy secondary to disc herniations and foraminal stenosis.  The various methods of treatment have been discussed with the patient and family. After consideration of risks, benefits and other options for treatment, the patient has consented to  Procedure(s): C5-C7 anterior cervical discectomy and fusion (N/A) as a surgical intervention.  The patient's history has been reviewed, patient examined, no change in status, stable for surgery.  I have reviewed the patient's chart and labs.  Questions were answered to the patient's satisfaction.    Heart and lungs clear  Penne LELON Sharps

## 2024-05-23 NOTE — Discharge Instructions (Signed)

## 2024-05-23 NOTE — Transfer of Care (Signed)
Immediate Anesthesia Transfer of Care Note  Patient: Mark Black  Procedure(s) Performed: C5-C7 anterior cervical discectomy and fusion  Patient Location: PACU  Anesthesia Type:General  Level of Consciousness: awake, alert , and oriented  Airway & Oxygen Therapy: Patient Spontanous Breathing and Patient connected to face mask oxygen  Post-op Assessment: Report given to RN and Post -op Vital signs reviewed and stable  Post vital signs: Reviewed and stable  Last Vitals:  Vitals Value Taken Time  BP 155/99 05/23/24 18:18  Temp 36.4 C 05/23/24 18:18  Pulse 116 05/23/24 18:18  Resp 16 05/23/24 18:18  SpO2 99 % 05/23/24 18:18    Last Pain:  Vitals:   05/23/24 1818  TempSrc: Tympanic  PainSc: 0-No pain         Complications: No notable events documented.

## 2024-05-23 NOTE — Anesthesia Postprocedure Evaluation (Signed)
Anesthesia Post Note  Patient: Mark Black  Procedure(s) Performed: C5-C7 anterior cervical discectomy and fusion  Patient location during evaluation: PACU Anesthesia Type: General Level of consciousness: awake and alert Pain management: pain level controlled Vital Signs Assessment: post-procedure vital signs reviewed and stable Respiratory status: spontaneous breathing, nonlabored ventilation and respiratory function stable Cardiovascular status: blood pressure returned to baseline and stable Postop Assessment: no apparent nausea or vomiting Anesthetic complications: no   No notable events documented.   Last Vitals:  Vitals:   05/23/24 1930 05/23/24 2047  BP: (!) 139/97 118/86  Pulse: 96 98  Resp:  18  Temp:  36.7 C  SpO2: 96% 96%    Last Pain:  Vitals:   05/23/24 2048  TempSrc:   PainSc: 4                  Camellia Merilee Louder

## 2024-05-24 ENCOUNTER — Other Ambulatory Visit: Payer: Self-pay | Admitting: Neurosurgery

## 2024-05-24 ENCOUNTER — Encounter: Payer: Self-pay | Admitting: Neurosurgery

## 2024-05-24 ENCOUNTER — Encounter: Admitting: Physician Assistant

## 2024-05-24 ENCOUNTER — Other Ambulatory Visit: Payer: Self-pay

## 2024-05-24 ENCOUNTER — Encounter: Payer: Self-pay | Admitting: Neurology

## 2024-05-24 ENCOUNTER — Other Ambulatory Visit (HOSPITAL_COMMUNITY): Payer: Self-pay | Admitting: Neurology

## 2024-05-24 DIAGNOSIS — Z981 Arthrodesis status: Secondary | ICD-10-CM | POA: Insufficient documentation

## 2024-05-24 DIAGNOSIS — M5116 Intervertebral disc disorders with radiculopathy, lumbar region: Secondary | ICD-10-CM | POA: Diagnosis not present

## 2024-05-24 DIAGNOSIS — M5412 Radiculopathy, cervical region: Secondary | ICD-10-CM

## 2024-05-24 LAB — HEMOGLOBIN A1C
Hgb A1c MFr Bld: 5.4 % (ref 4.8–5.6)
Mean Plasma Glucose: 108.28 mg/dL

## 2024-05-24 MED ORDER — INSULIN ASPART 100 UNIT/ML IJ SOLN: 0.0000 [IU] | Freq: Three times a day (TID) | INTRAMUSCULAR | Status: DC

## 2024-05-24 MED ORDER — METHOCARBAMOL 500 MG PO TABS
500.0000 mg | ORAL_TABLET | Freq: Four times a day (QID) | ORAL | 0 refills | Status: AC | PRN
  Filled 2024-05-24: qty 120, 30d supply, fill #0

## 2024-05-24 MED ORDER — INSULIN ASPART 100 UNIT/ML IJ SOLN: 0.0000 [IU] | Freq: Every day | INTRAMUSCULAR | Status: DC

## 2024-05-24 MED ORDER — HYDROCODONE-ACETAMINOPHEN 5-325 MG PO TABS
1.0000 | ORAL_TABLET | ORAL | 0 refills | Status: DC | PRN
  Filled 2024-05-24: qty 30, 5d supply, fill #0

## 2024-05-24 MED ORDER — POLYETHYLENE GLYCOL 3350 17 GM/SCOOP PO POWD
17.0000 g | Freq: Every day | ORAL | 0 refills | Status: AC | PRN
  Filled 2024-05-24: qty 238, 14d supply, fill #0

## 2024-05-24 MED ORDER — SENNA 8.6 MG PO TABS
1.0000 | ORAL_TABLET | Freq: Two times a day (BID) | ORAL | 0 refills | Status: AC | PRN
  Filled 2024-05-24: qty 30, 15d supply, fill #0

## 2024-05-24 NOTE — Progress Notes (Signed)
   Neurosurgery Progress Note  History: Mark Black is s/p C5-7 ACDF  POD1: Doing well this morning. Feels pain control is reasonable. Reports improvement in upper extremity strength   Physical Exam: Vitals:   05/23/24 2047 05/24/24 0434  BP: 118/86 114/75  Pulse: 98 (!) 107  Resp: 18 18  Temp: 98 F (36.7 C) 98.4 F (36.9 C)  SpO2: 96% 94%    AA Ox3 CNI  Strength: Side Biceps Triceps Deltoid Interossei Grip Wrist Ext. Wrist Flex.  R 5 4+ 5 5 4+ 4+ 5  L 5 4+ 5 5 4- 4- 5   Incision: c/d/I. There is some bruising around incision but no obvious swelling or drainage.   Phonating well.   Data:  Other tests/results: NA  Assessment/Plan:  Mark Black is a 58 y.o presenting with cervical radiculopathy s/p C5-7 ACDF  - mobilize - pain control - DVT prophylaxis - PTOT; dispo planning underway   Edsel Goods PA-C Department of Neurosurgery

## 2024-05-24 NOTE — Plan of Care (Signed)
  Problem: Education: Goal: Knowledge of General Education information will improve Description: Including pain rating scale, medication(s)/side effects and non-pharmacologic comfort measures Outcome: Progressing   Problem: Clinical Measurements: Goal: Diagnostic test results will improve Outcome: Progressing   Problem: Activity: Goal: Risk for activity intolerance will decrease Outcome: Progressing   Problem: Coping: Goal: Level of anxiety will decrease Outcome: Progressing   Problem: Pain Managment: Goal: General experience of comfort will improve and/or be controlled Outcome: Progressing   Problem: Safety: Goal: Ability to remain free from injury will improve Outcome: Progressing   Problem: Pain Management: Goal: Pain level will decrease Outcome: Progressing   Problem: Bladder/Genitourinary: Goal: Urinary functional status for postoperative course will improve Outcome: Progressing

## 2024-05-24 NOTE — Telephone Encounter (Signed)
 Dr. Tobie, Yes, we will get him scheduled 4wks from his last dose.  Thanks Luke

## 2024-05-24 NOTE — Progress Notes (Signed)
 All requirements for discharge met.

## 2024-05-24 NOTE — Evaluation (Addendum)
 Physical Therapy Evaluation Patient Details Name: Mark Black MRN: [id number] DOB: 1965-10-11 Today's Date: 05/24/2024  History of Present Illness  Pt is a 58 yo with a hx of bilateral cervical radiculopathy, bilateral neck pain, severe weakness in his left hand right upper extremity, wrist drop and tricep weakness and grip strength, MG, GERD, Hypothyroidism, DM, anxiety, and alopecia. Pt s/p ACDF C5-7 on 05/23/24.   Clinical Impression  Pt was in the bed on arrival, in some pain 2/10, but otherwise pleasant and motivated to participate during the session and put forth good effort throughout. Pt's bed mobility, gait and stair negotiation was assessed. Pt did not require an AD and was able to walk around the nurses station twice (350 ft) without any LOB or rest breaks and negotiated stairs with no assistance, using the rails on both sides. Pt will benefit from continued PT services upon discharge to safely address deficits, as needed, listed in patient problem list for decreased caregiver assistance and eventual return to PLOF.       If plan is discharge home, recommend the following: Assistance with cooking/housework;Assist for transportation   Can travel by private vehicle        Equipment Recommendations    Recommendations for Other Services       Functional Status Assessment Patient has had a recent decline in their functional status and demonstrates the ability to make significant improvements in function in a reasonable and predictable amount of time.     Precautions / Restrictions Precautions Precautions: Cervical Precaution Booklet Issued: No Recall of Precautions/Restrictions: Intact Precaution/Restrictions Comments: pt was educated on ROM restriction and letting pain be the guide Restrictions Weight Bearing Restrictions Per Provider Order: No      Mobility  Bed Mobility Overal bed mobility: Independent                  Transfers Overall transfer level:  Modified independent                 General transfer comment: no AD needed    Ambulation/Gait Ambulation/Gait assistance: Modified independent (Device/Increase time) Gait Distance (Feet): 350 Feet    Gait Pattern/deviations: WFL(Within Functional Limits) Gait velocity: normal   Pre-gait activities: marching General Gait Details: pt able to ambulate around the nurses station twice, go up and down stairs, did not require rest breaks - no AD needed.  Stairs 4  Stairs: Yes Stairs assistance: Independent Stair Management: One rail Right Number of Stairs: 4 General stair comments: reciprocal pattern, used railing for UE support on descent  Wheelchair Mobility     Tilt Bed    Modified Rankin (Stroke Patients Only)       Balance Overall balance assessment: Independent                                           Pertinent Vitals/Pain Pain Assessment Pain Assessment: 0-10 Pain Score: 2  Pain Location: cervical region Pain Descriptors / Indicators: Aching, Sore Pain Intervention(s): Monitored during session, Repositioned    Home Living Family/patient expects to be discharged to:: Private residence Living Arrangements: Spouse/significant other Available Help at Discharge: Family;Available 24 hours/day Type of Home: House Home Access: Stairs to enter Entrance Stairs-Rails: Can reach both Entrance Stairs-Number of Steps: 3-4   Home Layout: One level Home Equipment: None Additional Comments: no AD    Prior Function Prior Level of Function :  Independent/Modified Independent             Mobility Comments: community ambulator ADLs Comments: ind with ADLs and IADLs.     Extremity/Trunk Assessment   Upper Extremity Assessment Upper Extremity Assessment: Overall WFL for tasks assessed    Lower Extremity Assessment Lower Extremity Assessment: Overall WFL for tasks assessed    Cervical / Trunk Assessment Cervical / Trunk Assessment:  Neck Surgery  Communication   Communication Communication: No apparent difficulties    Cognition Arousal: Alert Behavior During Therapy: WFL for tasks assessed/performed   PT - Cognitive impairments: No apparent impairments                         Following commands: Intact       Cueing Cueing Techniques: Verbal cues     General Comments      Exercises General Exercises - Upper Extremity Shoulder Flexion: AROM, Supine, Strengthening, Both (4/5) Elbow Flexion: AROM, Strengthening, Both (3/5) Wrist Flexion: AROM, Strengthening, Right (4/5) Wrist Extension: AROM, Strengthening, Left (4/5)   Assessment/Plan    PT Assessment Patient needs continued PT services  PT Problem List Decreased strength;Decreased range of motion;Decreased mobility;Decreased knowledge of precautions;Pain       PT Treatment Interventions      PT Goals (Current goals can be found in the Care Plan section)  Acute Rehab PT Goals Patient Stated Goal: getting back home PT Goal Formulation: With patient/family Time For Goal Achievement: 06/06/24 Potential to Achieve Goals: Good    Frequency Min 1X/week     Co-evaluation               AM-PAC PT 6 Clicks Mobility  Outcome Measure Help needed turning from your back to your side while in a flat bed without using bedrails?: None Help needed moving from lying on your back to sitting on the side of a flat bed without using bedrails?: None Help needed moving to and from a bed to a chair (including a wheelchair)?: None Help needed standing up from a chair using your arms (e.g., wheelchair or bedside chair)?: None Help needed to walk in hospital room?: None Help needed climbing 3-5 steps with a railing? : None 6 Click Score: 24    End of Session Equipment Utilized During Treatment: Gait belt Activity Tolerance: Patient tolerated treatment well;No increased pain Patient left: in chair;with call bell/phone within reach;with chair alarm  set;with family/visitor present Nurse Communication: Mobility status PT Visit Diagnosis: Other abnormalities of gait and mobility (R26.89);Pain Pain - part of body:  (Cervical region)    Time: 9161-9097 PT Time Calculation (min) (ACUTE ONLY): 24 min   Charges:               This entire session was performed under direct supervision and direction of a licensed therapist/therapist assistant. I have personally read, edited and approve of the note as written.  Carmin Deed, DPT  Corean Newport, SPT 05/24/24, 10:50 AM

## 2024-05-24 NOTE — Evaluation (Signed)
 Occupational Therapy Evaluation Patient Details Name: Mark Black MRN: [id number] DOB: 24-Nov-1965 Today's Date: 05/24/2024   History of Present Illness   Pt is a 58 yo with a hx of bilateral cervical radiculopathy, bilateral neck pain, severe weakness in his left hand right upper extremity, wrist drop and tricep weakness and grip strength, MG, GERD, Hypothyroidism, DM, anxiety, and alopecia. Pt s/p ACDF C5-7 on 05/23/24.     Clinical Impressions Upon entering the room, pt seated in recliner chair with wife present in room. Pt reports being Ind prior to admission. He endorses numbness and weakness in B UEs prior to surgery but having some return of strength and sensation post surgery. OT educated pt on LE dressing strategies to increase independence while maintaining precautions. Pt performing figure four position to thread pants and stands to don independently. Pt able to don B socks and shoes without assistance. OT answered questions regarding showering when home. No further questions at this time and pt feels comfortable with discharge home. Pt with no further acute OT needs. OT to complete order at this time.       Functional Status Assessment   Patient has not had a recent decline in their functional status     Equipment Recommendations   None recommended by OT      Precautions/Restrictions   Precautions Precautions: Cervical Restrictions Weight Bearing Restrictions Per Provider Order: No Other Position/Activity Restrictions: no brace needed     Mobility Bed Mobility               General bed mobility comments: seated in recliner chair at beginning and end of session    Transfers Overall transfer level: Modified independent                        Balance Overall balance assessment: Independent                                         ADL either performed or assessed with clinical judgement   ADL Overall ADL's : Modified  independent                                             Vision Patient Visual Report: No change from baseline              Pertinent Vitals/Pain Pain Assessment Pain Assessment: Faces Faces Pain Scale: Hurts a little bit Pain Location: incision Pain Descriptors / Indicators: Aching, Discomfort Pain Intervention(s): Monitored during session     Extremity/Trunk Assessment Upper Extremity Assessment Upper Extremity Assessment: Overall WFL for tasks assessed   Lower Extremity Assessment Lower Extremity Assessment: Overall WFL for tasks assessed   Cervical / Trunk Assessment Cervical / Trunk Assessment: Neck Surgery   Communication Communication Communication: No apparent difficulties   Cognition Arousal: Alert Behavior During Therapy: WFL for tasks assessed/performed Cognition: No apparent impairments                               Following commands: Intact                  Home Living Family/patient expects to be discharged to:: Private residence Living Arrangements: Spouse/significant other Available Help  at Discharge: Family;Available 24 hours/day Type of Home: House Home Access: Stairs to enter [employer] of Steps: 3-4 Entrance Stairs-Rails: Can reach both Home Layout: One level     Bathroom Shower/Tub: Industrial/product Designer Accessibility: Yes   Home Equipment: None   Additional Comments: no AD      Prior Functioning/Environment Prior Level of Function : Independent/Modified Independent             Mobility Comments: community ambulator ADLs Comments: ind with ADLs and IADLs.     AM-PAC OT 6 Clicks Daily Activity     Outcome Measure Help from another person eating meals?: None Help from another person taking care of personal grooming?: None Help from another person toileting, which includes using toliet, bedpan, or urinal?: None Help from another person  bathing (including washing, rinsing, drying)?: None Help from another person to put on and taking off regular upper body clothing?: None Help from another person to put on and taking off regular lower body clothing?: None 6 Click Score: 24   End of Session Nurse Communication: Mobility status  Activity Tolerance: Patient tolerated treatment well Patient left: in chair;with family/visitor present                   Time: 1010-1030 OT Time Calculation (min): 20 min Charges:  OT General Charges $OT Visit: 1 Visit OT Evaluation $OT Eval Low Complexity: 1 Low OT Treatments $Self Care/Home Management : 8-22 mins  Izetta Claude, MS, OTR/L , CBIS ascom [phone]  05/24/24, 10:40 AM

## 2024-05-24 NOTE — Discharge Summary (Signed)
Discharge Summary  Patient ID: Mark Black MRN: [id number] DOB/AGE: 19-Dec-1965 58 y.o.  Admit date: 05/23/2024 Discharge date: 05/24/2024  Admission Diagnoses: Right sided C6-7 radiculopathy secondary to disc herniations and foraminal stenosis  Discharge Diagnoses:  Principal Problem:   Cervical radiculopathy Active Problems:   Herniation of cervical intervertebral disc with radiculopathy   Cervical radiculopathy at C6   Cervical radiculopathy at C7   Discharged Condition: good  Hospital Course:  Mark Black is a 58 y.o presenting with cervical radiculopathy s/p C5-7 ACDF. His intraoperative course was uncomplicated. He was admitted overnight for pain control and therapy evaluation. He was seen and evaluated by therapy and deemed appropriate for discharge home on POD1 without further needs.   Consults: None  Significant Diagnostic Studies: see results review  Treatments: surgery: as above. Please see separately dictated operative report for further details   Discharge Exam: Blood pressure (!) 143/93, pulse (!) 101, temperature 97.8 F (36.6 C), resp. rate 17, height 5' 10.5 (1.791 m), weight 86.2 kg, SpO2 95%. AA Ox3 CNI   Strength: Side Biceps Triceps Deltoid Interossei Grip Wrist Ext. Wrist Flex.  R 5 4+ 5 5 4+ 4+ 5  L 5 4+ 5 5 4- 4- 5    Incision: c/d/I. There is some bruising around incision but no obvious swelling or drainage.   Disposition: Discharge disposition: 01-Home or Self Care       Discharge Instructions     Incentive spirometry RT   Complete by: As directed    No dressing needed   Complete by: As directed    No dressing needed   Complete by: As directed       Allergies as of 05/24/2024       Reactions   Percocet [oxycodone-acetaminophen ] Nausea And Vomiting        Medication List     TAKE these medications    amLODipine -benazepril  10-40 MG capsule Commonly known as: LOTREL  TAKE 1 CAPSULE BY MOUTH ONCE DAILY    buPROPion  150 MG 24 hr tablet Commonly known as: WELLBUTRIN  XL Take 1 tablet (150 mg total) by mouth daily.   desvenlafaxine  100 MG 24 hr tablet Commonly known as: PRISTIQ  Take 1 tablet (100 mg total) by mouth daily.   Farxiga  5 MG Tabs tablet Generic drug: dapagliflozin  propanediol Take 1 tablet (5 mg total) by mouth daily.   FreeStyle Libre 3 Sensor Misc Please apply for 14 days and then switch to new sensor   HYDROcodone-acetaminophen  5-325 MG tablet Commonly known as: NORCO/VICODIN Take 1 tablet by mouth every 4 (four) hours as needed for moderate pain (pain score 4-6).   levothyroxine  175 MCG tablet Commonly known as: SYNTHROID  Take 1 tablet (175 mcg total) by mouth daily before breakfast.   loratadine 10 MG tablet Commonly known as: CLARITIN Take 10 mg by mouth daily.   metFORMIN  500 MG 24 hr tablet Commonly known as: GLUCOPHAGE -XR Take 3 tablets (1,500 mg total) by mouth daily with breakfast.   methocarbamol  500 MG tablet Commonly known as: ROBAXIN  Take 1 tablet (500 mg total) by mouth every 6 (six) hours as needed for muscle spasms.   Mounjaro  7.5 MG/0.5ML Pen Generic drug: tirzepatide  Inject 7.5 mg into the skin once a week.   MULTIVITAMIN ADULT PO Take 1 tablet by mouth daily.   pantoprazole  40 MG tablet Commonly known as: PROTONIX  Take 1 tablet (40 mg total) by mouth daily.   polyethylene glycol powder 17 GM/SCOOP powder Commonly known as: GLYCOLAX/MIRALAX Take 17 g by  mouth daily as needed for moderate constipation. Dissolve 1 capful (17g) in 4-8 ounces of liquid and take by mouth daily as needed.   predniSONE  10 MG tablet Commonly known as: DELTASONE  Take 2 tablets (20 mg total) by mouth daily.   senna 8.6 MG Tabs tablet Commonly known as: SENOKOT Take 1 tablet (8.6 mg total) by mouth 2 (two) times daily as needed for mild constipation.   tadalafil  10 MG tablet Commonly known as: CIALIS  Take 1-2 tablets (10-20 mg total) by mouth every other  day as needed for erectile dysfunction.   tamsulosin  0.4 MG Caps capsule Commonly known as: FLOMAX  Take 1 capsule (0.4 mg total) by mouth daily.               Discharge Care Instructions  (From admission, onward)           Start     Ordered   05/24/24 0000  No dressing needed        05/24/24 1035   05/24/24 0000  No dressing needed        05/24/24 1036             Signed: Edsel Jama Goods 05/24/2024, 10:37 AM

## 2024-05-24 NOTE — Plan of Care (Signed)
 Problem: Education: Goal: Knowledge of General Education information will improve Description: Including pain rating scale, medication(s)/side effects and non-pharmacologic comfort measures 05/24/2024 1056 by Dequavion Follette, Rosaline LABOR, RN Outcome: Adequate for Discharge 05/24/2024 1056 by Simmie Camerer, Rosaline LABOR, RN Outcome: Progressing   Problem: Health Behavior/Discharge Planning: Goal: Ability to manage health-related needs will improve 05/24/2024 1056 by Ruhani Umland, Rosaline LABOR, RN Outcome: Adequate for Discharge 05/24/2024 1056 by Shaquanta Harkless, Rosaline A, RN Outcome: Progressing   Problem: Clinical Measurements: Goal: Ability to maintain clinical measurements within normal limits will improve 05/24/2024 1056 by Marianna Cid, Rosaline LABOR, RN Outcome: Adequate for Discharge 05/24/2024 1056 by Lesha Jager, Rosaline LABOR, RN Outcome: Progressing Goal: Will remain Adelle Zachar from infection 05/24/2024 1056 by Johnie Makki, Rosaline LABOR, RN Outcome: Adequate for Discharge 05/24/2024 1056 by Tifanny Dollens, Rosaline LABOR, RN Outcome: Progressing Goal: Diagnostic test results will improve 05/24/2024 1056 by Adreanne Yono, Rosaline LABOR, RN Outcome: Adequate for Discharge 05/24/2024 1056 by Maryori Weide, Rosaline LABOR, RN Outcome: Progressing Goal: Respiratory complications will improve 05/24/2024 1056 by Inika Bellanger, Rosaline LABOR, RN Outcome: Adequate for Discharge 05/24/2024 1056 by Crystalynn Mcinerney, Rosaline LABOR, RN Outcome: Progressing Goal: Cardiovascular complication will be avoided 05/24/2024 1056 by Iyonna Rish, Rosaline LABOR, RN Outcome: Adequate for Discharge 05/24/2024 1056 by Goddess Gebbia, Rosaline LABOR, RN Outcome: Progressing   Problem: Activity: Goal: Risk for activity intolerance will decrease 05/24/2024 1056 by Damyra Luscher, Rosaline LABOR, RN Outcome: Adequate for Discharge 05/24/2024 1056 by Angelique Chevalier, Rosaline A, RN Outcome: Progressing   Problem: Nutrition: Goal: Adequate nutrition will be maintained 05/24/2024 1056 by Gredmarie Delange, Rosaline LABOR, RN Outcome: Adequate for Discharge 05/24/2024 1056 by Keyon Winnick, Rosaline  A, RN Outcome: Progressing   Problem: Coping: Goal: Level of anxiety will decrease 05/24/2024 1056 by Jyaire Koudelka, Rosaline LABOR, RN Outcome: Adequate for Discharge 05/24/2024 1056 by Balraj Brayfield, Rosaline A, RN Outcome: Progressing   Problem: Elimination: Goal: Will not experience complications related to bowel motility 05/24/2024 1056 by Mio Schellinger, Rosaline LABOR, RN Outcome: Adequate for Discharge 05/24/2024 1056 by Litha Lamartina, Rosaline LABOR, RN Outcome: Progressing Goal: Will not experience complications related to urinary retention 05/24/2024 1056 by Morna Flud, Rosaline LABOR, RN Outcome: Adequate for Discharge 05/24/2024 1056 by Sharaya Boruff, Rosaline A, RN Outcome: Progressing   Problem: Pain Managment: Goal: General experience of comfort will improve and/or be controlled 05/24/2024 1056 by Sharlynn Seckinger, Rosaline LABOR, RN Outcome: Adequate for Discharge 05/24/2024 1056 by Avry Monteleone, Rosaline A, RN Outcome: Progressing   Problem: Safety: Goal: Ability to remain Osias Resnick from injury will improve 05/24/2024 1056 by Finnlee Silvernail, Rosaline LABOR, RN Outcome: Adequate for Discharge 05/24/2024 1056 by Jeryl Wilbourn, Rosaline A, RN Outcome: Progressing   Problem: Skin Integrity: Goal: Risk for impaired skin integrity will decrease 05/24/2024 1056 by Haasini Patnaude, Rosaline LABOR, RN Outcome: Adequate for Discharge 05/24/2024 1056 by Dylann Gallier, Rosaline A, RN Outcome: Progressing   Problem: Education: Goal: Ability to verbalize activity precautions or restrictions will improve 05/24/2024 1056 by Hoke Baer, Rosaline LABOR, RN Outcome: Adequate for Discharge 05/24/2024 1056 by Tyronne Blann, Rosaline A, RN Outcome: Progressing Goal: Knowledge of the prescribed therapeutic regimen will improve 05/24/2024 1056 by Sharica Roedel, Rosaline LABOR, RN Outcome: Adequate for Discharge 05/24/2024 1056 by Dequavius Kuhner, Rosaline LABOR, RN Outcome: Progressing Goal: Understanding of discharge needs will improve 05/24/2024 1056 by Alayla Dethlefs, Rosaline LABOR, RN Outcome: Adequate for Discharge 05/24/2024 1056 by Marygrace Sandoval, Rosaline LABOR,  RN Outcome: Progressing   Problem: Activity: Goal: Ability to avoid complications of mobility impairment will improve 05/24/2024 1056 by Vertis Bauder, Rosaline LABOR, RN Outcome: Adequate for Discharge 05/24/2024 1056 by Mishaal Lansdale, Rosaline LABOR, RN Outcome: Progressing Goal: Ability to tolerate increased activity  will improve 05/24/2024 1056 by Astaria Nanez, Rosaline LABOR, RN Outcome: Adequate for Discharge 05/24/2024 1056 by Jasmaine Rochel, Rosaline LABOR, RN Outcome: Progressing Goal: Will remain Nickolette Espinola from falls 05/24/2024 1056 by Dunbar Buras, Rosaline LABOR, RN Outcome: Adequate for Discharge 05/24/2024 1056 by Drayson Dorko, Rosaline LABOR, RN Outcome: Progressing   Problem: Bowel/Gastric: Goal: Gastrointestinal status for postoperative course will improve 05/24/2024 1056 by Dena Esperanza, Rosaline LABOR, RN Outcome: Adequate for Discharge 05/24/2024 1056 by Lekesha Claw, Rosaline A, RN Outcome: Progressing   Problem: Clinical Measurements: Goal: Ability to maintain clinical measurements within normal limits will improve 05/24/2024 1056 by Marvel Sapp, Rosaline LABOR, RN Outcome: Adequate for Discharge 05/24/2024 1056 by Sinia Antosh, Rosaline A, RN Outcome: Progressing Goal: Postoperative complications will be avoided or minimized 05/24/2024 1056 by Charnette Younkin, Rosaline LABOR, RN Outcome: Adequate for Discharge 05/24/2024 1056 by Mashell Sieben, Rosaline LABOR, RN Outcome: Progressing Goal: Diagnostic test results will improve 05/24/2024 1056 by Arwen Haseley, Rosaline LABOR, RN Outcome: Adequate for Discharge 05/24/2024 1056 by Lilja Soland, Rosaline A, RN Outcome: Progressing   Problem: Pain Management: Goal: Pain level will decrease 05/24/2024 1056 by Deaisa Merida, Rosaline LABOR, RN Outcome: Adequate for Discharge 05/24/2024 1056 by Brianna Esson, Rosaline A, RN Outcome: Progressing   Problem: Skin Integrity: Goal: Will show signs of wound healing 05/24/2024 1056 by Solangel Mcmanaway, Rosaline LABOR, RN Outcome: Adequate for Discharge 05/24/2024 1056 by Azia Toutant, Rosaline LABOR, RN Outcome: Progressing   Problem: Health Behavior/Discharge  Planning: Goal: Identification of resources available to assist in meeting health care needs will improve 05/24/2024 1056 by Cameron Schwinn, Rosaline LABOR, RN Outcome: Adequate for Discharge 05/24/2024 1056 by Ameer Sanden, Rosaline A, RN Outcome: Progressing   Problem: Bladder/Genitourinary: Goal: Urinary functional status for postoperative course will improve 05/24/2024 1056 by Javyon Fontan, Rosaline LABOR, RN Outcome: Adequate for Discharge 05/24/2024 1056 by Jessicca Stitzer, Rosaline A, RN Outcome: Progressing   Problem: Education: Goal: Ability to describe self-care measures that may prevent or decrease complications (Diabetes Survival Skills Education) will improve 05/24/2024 1056 by Kyleena Scheirer, Rosaline LABOR, RN Outcome: Adequate for Discharge 05/24/2024 1056 by Dwyane Dupree, Rosaline LABOR, RN Outcome: Progressing Goal: Individualized Educational Video(s) 05/24/2024 1056 by Rutherford Rosaline LABOR, RN Outcome: Adequate for Discharge 05/24/2024 1056 by Whittley Carandang, Rosaline A, RN Outcome: Progressing   Problem: Coping: Goal: Ability to adjust to condition or change in health will improve 05/24/2024 1056 by Ojani Berenson, Rosaline LABOR, RN Outcome: Adequate for Discharge 05/24/2024 1056 by Azim Gillingham, Rosaline A, RN Outcome: Progressing   Problem: Fluid Volume: Goal: Ability to maintain a balanced intake and output will improve 05/24/2024 1056 by Cass Edinger, Rosaline LABOR, RN Outcome: Adequate for Discharge 05/24/2024 1056 by Kweli Grassel, Rosaline LABOR, RN Outcome: Progressing   Problem: Health Behavior/Discharge Planning: Goal: Ability to identify and utilize available resources and services will improve 05/24/2024 1056 by Reannah Totten, Rosaline LABOR, RN Outcome: Adequate for Discharge 05/24/2024 1056 by Aniketh Huberty, Rosaline A, RN Outcome: Progressing Goal: Ability to manage health-related needs will improve 05/24/2024 1056 by Calie Buttrey, Rosaline LABOR, RN Outcome: Adequate for Discharge 05/24/2024 1056 by Shylin Keizer, Rosaline A, RN Outcome: Progressing   Problem: Metabolic: Goal: Ability to maintain  appropriate glucose levels will improve 05/24/2024 1056 by Burnett Lieber, Rosaline LABOR, RN Outcome: Adequate for Discharge 05/24/2024 1056 by Demontay Grantham, Rosaline LABOR, RN Outcome: Progressing   Problem: Nutritional: Goal: Maintenance of adequate nutrition will improve 05/24/2024 1056 by Toua Stites, Rosaline LABOR, RN Outcome: Adequate for Discharge 05/24/2024 1056 by Carnelius Hammitt, Rosaline LABOR, RN Outcome: Progressing Goal: Progress toward achieving an optimal weight will improve 05/24/2024 1056 by Afra Tricarico, Rosaline LABOR, RN Outcome: Adequate for Discharge 05/24/2024 1056 by Rutherford Rosaline  A, RN Outcome: Progressing   Problem: Skin Integrity: Goal: Risk for impaired skin integrity will decrease 05/24/2024 1056 by Markies Mowatt, Rosaline LABOR, RN Outcome: Adequate for Discharge 05/24/2024 1056 by Jeran Hiltz, Rosaline A, RN Outcome: Progressing   Problem: Tissue Perfusion: Goal: Adequacy of tissue perfusion will improve 05/24/2024 1056 by Lavar Rosenzweig, Rosaline LABOR, RN Outcome: Adequate for Discharge 05/24/2024 1056 by Crespin Forstrom, Rosaline LABOR, RN Outcome: Progressing

## 2024-05-25 ENCOUNTER — Other Ambulatory Visit (HOSPITAL_COMMUNITY): Payer: Self-pay

## 2024-05-26 ENCOUNTER — Telehealth: Payer: Self-pay

## 2024-05-26 NOTE — Telephone Encounter (Signed)
 Copied from CRM #[id number]. Topic: Clinical - Home Health Verbal Orders >> May 26, 2024  9:50 AM Delon DASEN wrote: Caller/Agency: Signe with [facility] Callback Number: [phone] secure line Service Requested: Skilled Nursing Frequency: n/a Any new concerns about the patient? No- not taking on the patient, he is not home bound, bp elevated no symptoms 150/100 at beginning of visit and 140-100 at end of visit, missing one medication, Farxiga 

## 2024-05-26 NOTE — Telephone Encounter (Signed)
Left detailed message advising of recommendations.  

## 2024-05-28 ENCOUNTER — Encounter: Payer: Self-pay | Admitting: Neurosurgery

## 2024-05-31 ENCOUNTER — Other Ambulatory Visit: Payer: Self-pay | Admitting: Physician Assistant

## 2024-05-31 DIAGNOSIS — G959 Disease of spinal cord, unspecified: Secondary | ICD-10-CM

## 2024-06-05 ENCOUNTER — Ambulatory Visit (INDEPENDENT_AMBULATORY_CARE_PROVIDER_SITE_OTHER)

## 2024-06-05 ENCOUNTER — Encounter: Payer: Self-pay | Admitting: Physician Assistant

## 2024-06-05 ENCOUNTER — Ambulatory Visit: Admitting: Physician Assistant

## 2024-06-05 VITALS — BP 130/80 | Temp 97.9°F

## 2024-06-05 DIAGNOSIS — G959 Disease of spinal cord, unspecified: Secondary | ICD-10-CM

## 2024-06-05 DIAGNOSIS — Z981 Arthrodesis status: Secondary | ICD-10-CM

## 2024-06-05 DIAGNOSIS — M50123 Cervical disc disorder at C6-C7 level with radiculopathy: Secondary | ICD-10-CM

## 2024-06-05 DIAGNOSIS — M47812 Spondylosis without myelopathy or radiculopathy, cervical region: Secondary | ICD-10-CM | POA: Diagnosis not present

## 2024-06-05 NOTE — Progress Notes (Signed)
   REFERRING PHYSICIAN:  Alvia Bring, Do [address] [place],  [place] [address]  DOS: 05/23/24, C5-7 ACDF  HISTORY OF PRESENT ILLNESS: Mark Black is 2 weeks status post C5-7 ACDF. Overall, he is doing well.  He does have some soreness in his neck and in between his shoulder blades, but his left arm symptoms have improved significantly.  He does continue to have some periodic numbness and tingling in both of his arms.  However the motion in his left hand and strength has improved quite a bit.  He is only taking Robaxin  for his pain.     PHYSICAL EXAMINATION:  NEUROLOGICAL:  General: In no acute distress.   Awake, alert, oriented to person, place, and time.  Pupils equal round and reactive to light.  Facial tone is symmetric.    Strength:  Side Biceps Triceps Deltoid Interossei Grip Wrist Ext. Wrist Flex.  R 5 4+ 5 5 5  4+ 5  L 5 4 5 5  4- 3 5      Incision c/d/I.  Small area in which patient was spitting suture.  This was removed.  Imaging:  Awaiting formal read, but patient's x-rays completed today in clinic are well-appearing without any hardware failure.  Assessment / Plan: Mark Black is doing well after C5-7 ACDF on 05/23/2024. We discussed activity escalation and I have advised the patient to lift up to 10 pounds until 6 weeks after surgery, then increase up to 25 pounds until 12 weeks after surgery.  After 12 weeks post-op, the patient advised to increase activity as tolerated. he will return to clinic in approximately 1 month for 6-week postop visit.  In regards to his pain, advised patient to continue taking his Robaxin .  In addition would like him to take Tylenol  1000 mg 3-4 times a day to help as well.      Advised to contact the office if any questions or concerns arise.   Lyle Decamp PA-C Dept of Neurosurgery

## 2024-06-12 ENCOUNTER — Ambulatory Visit (INDEPENDENT_AMBULATORY_CARE_PROVIDER_SITE_OTHER)

## 2024-06-12 VITALS — BP 134/89 | HR 93 | Temp 97.4°F | Resp 16 | Ht 71.0 in | Wt 190.4 lb

## 2024-06-12 DIAGNOSIS — G7000 Myasthenia gravis without (acute) exacerbation: Secondary | ICD-10-CM | POA: Diagnosis not present

## 2024-06-12 MED ORDER — EFGARTIGIMOD ALFA-HYALUR-QVFC 180-2000 MG-UNIT/ML ~~LOC~~ SOLN
1008.0000 mg | Freq: Once | SUBCUTANEOUS | Status: AC
  Administered 2024-06-12: 1008 mg via SUBCUTANEOUS
  Filled 2024-06-12: qty 5.6

## 2024-06-12 NOTE — Progress Notes (Signed)
 Diagnosis: , Myasthenia Gravis  Provider:  Mannam, Praveen MD  Procedure: Injection  Vyvgart  Hytrulo, Dose: 1008 mg, Site: subcutaneous, Number of injections: 1  Injection Site(s): Left lower quad. abdomen  Post Care: Observation period completed and Subcutaneous infusion needle discontinued  Discharge: Condition: Good, Destination: Home . AVS Declined  Performed by:  Leita FORBES Miles, LPN

## 2024-06-16 ENCOUNTER — Encounter: Payer: Self-pay | Admitting: Family Medicine

## 2024-06-16 ENCOUNTER — Other Ambulatory Visit: Payer: Self-pay | Admitting: Family Medicine

## 2024-06-16 ENCOUNTER — Other Ambulatory Visit: Payer: Self-pay

## 2024-06-16 ENCOUNTER — Other Ambulatory Visit (HOSPITAL_COMMUNITY): Payer: Self-pay

## 2024-06-16 DIAGNOSIS — E119 Type 2 diabetes mellitus without complications: Secondary | ICD-10-CM

## 2024-06-16 DIAGNOSIS — I10 Essential (primary) hypertension: Secondary | ICD-10-CM

## 2024-06-16 MED ORDER — AMLODIPINE BESY-BENAZEPRIL HCL 10-40 MG PO CAPS
1.0000 | ORAL_CAPSULE | Freq: Every day | ORAL | 1 refills | Status: AC
  Filled 2024-06-16: qty 90, 90d supply, fill #0

## 2024-06-16 MED ORDER — MOUNJARO 7.5 MG/0.5ML ~~LOC~~ SOAJ
7.5000 mg | SUBCUTANEOUS | 0 refills | Status: AC
  Filled 2024-06-16: qty 6, 84d supply, fill #0

## 2024-06-19 ENCOUNTER — Other Ambulatory Visit: Payer: Self-pay | Admitting: Neurosurgery

## 2024-06-19 ENCOUNTER — Ambulatory Visit (INDEPENDENT_AMBULATORY_CARE_PROVIDER_SITE_OTHER)

## 2024-06-19 ENCOUNTER — Encounter: Admitting: Neurosurgery

## 2024-06-19 VITALS — BP 145/86 | HR 98 | Temp 98.0°F | Resp 14 | Ht 70.0 in | Wt 192.2 lb

## 2024-06-19 DIAGNOSIS — G959 Disease of spinal cord, unspecified: Secondary | ICD-10-CM

## 2024-06-19 DIAGNOSIS — G7000 Myasthenia gravis without (acute) exacerbation: Secondary | ICD-10-CM

## 2024-06-19 MED ORDER — EFGARTIGIMOD ALFA-HYALUR-QVFC 180-2000 MG-UNIT/ML ~~LOC~~ SOLN
1008.0000 mg | Freq: Once | SUBCUTANEOUS | Status: AC
  Administered 2024-06-19: 1008 mg via SUBCUTANEOUS

## 2024-06-19 NOTE — Progress Notes (Signed)
 Diagnosis: Myasthenia Gravis  Provider:  Mannam, Praveen MD  Procedure: Injection  Vyvgart , Dose: 1000 mg, Site: subcutaneous, Number of injections: 1  Injection Site(s): Right lower quad. abdomne  Post Care: Observation period completed  Discharge: Condition: Good, Destination: Home . AVS Declined  Performed by:  Donny Childes, RN

## 2024-06-26 ENCOUNTER — Other Ambulatory Visit (HOSPITAL_COMMUNITY): Payer: Self-pay

## 2024-06-26 ENCOUNTER — Ambulatory Visit

## 2024-06-28 ENCOUNTER — Ambulatory Visit

## 2024-06-28 VITALS — BP 134/86 | HR 105 | Temp 98.0°F | Resp 16 | Ht 71.0 in | Wt 192.2 lb

## 2024-06-28 DIAGNOSIS — G7000 Myasthenia gravis without (acute) exacerbation: Secondary | ICD-10-CM

## 2024-06-28 MED ORDER — EFGARTIGIMOD ALFA-HYALUR-QVFC 180-2000 MG-UNIT/ML ~~LOC~~ SOLN
1008.0000 mg | Freq: Once | SUBCUTANEOUS | Status: AC
  Administered 2024-06-28: 13:00:00 1008 mg via SUBCUTANEOUS
  Filled 2024-06-28: qty 5.6

## 2024-06-28 NOTE — Progress Notes (Signed)
 Diagnosis: , Myasthenia Gravis  Provider:  Mannam, Praveen MD  Procedure: Injection  Vyvgart , Dose: 1008 mg, Site: subcutaneous, Number of injections: 1  Injection Site(s): Left lower quad. abdomen  Post Care: Patient declined observation  Discharge: Condition: Good, Destination: Home . AVS Declined  Performed by:  Donny Childes, RN

## 2024-06-30 ENCOUNTER — Ambulatory Visit: Admitting: Neurosurgery

## 2024-06-30 ENCOUNTER — Ambulatory Visit (INDEPENDENT_AMBULATORY_CARE_PROVIDER_SITE_OTHER)

## 2024-06-30 ENCOUNTER — Telehealth: Payer: Self-pay | Admitting: Neurology

## 2024-06-30 VITALS — BP 120/82 | Temp 98.3°F | Wt 192.0 lb

## 2024-06-30 DIAGNOSIS — Z981 Arthrodesis status: Secondary | ICD-10-CM | POA: Diagnosis not present

## 2024-06-30 DIAGNOSIS — M50123 Cervical disc disorder at C6-C7 level with radiculopathy: Secondary | ICD-10-CM

## 2024-06-30 DIAGNOSIS — G959 Disease of spinal cord, unspecified: Secondary | ICD-10-CM | POA: Diagnosis not present

## 2024-06-30 NOTE — Progress Notes (Unsigned)
" ° °  REFERRING PHYSICIAN:  Alvia Bring, Do [address] [place],  [place] [address]  DOSurgery: 05/23/24, C5-7 ACDF  Discussed the use of AI scribe software for clinical note transcription with the patient, who gave verbal consent to proceed.  History of Present Illness Mark Black is a 58 year old male with cervical radiculopathy due to disc herniation and foraminal stenosis, cervical myelomalacia, and recent anterior cervical discectomy and fusion who presents for six-week postoperative follow-up.  He reports overall improvement since surgery with persistent numbness in both hands, most prominent from the thumb up the arm, and substantial improvement in motion and strength compared to his preoperative baseline, when one hand was functionally impaired for 9-10 months and the other was numb.  He continues muscle relaxants twice daily. He has not started formal physical therapy and asks about safely resuming gym activities, including rowing machine, treadmill, and bike.  He has no wound healing concerns.     PHYSICAL EXAMINATION:  NEUROLOGICAL:  General: In no acute distress.   Awake, alert, oriented to person, place, and time.  Pupils equal round and reactive to light.  Facial tone is symmetric.    Strength:  Side Biceps Triceps Deltoid Interossei Grip Wrist Ext. Wrist Flex.  R 5 4+ 5 5 5  4+ 5  L 5 4+ 5 5 4 4 5       Incision c/d/I.    Imaging:  Awaiting formal read, but patient's x-rays completed today in clinic are well-appearing without any hardware failure.  Assessment and Plan Assessment & Plan Right-sided C6-7 radiculopathy secondary to disc herniations and foraminal stenosis Six weeks post-operative from C5-C7 anterior cervical discectomy and fusion. He demonstrates marked improvement in strength and function, with persistent but expected numbness due to chronicity of preoperative symptoms. Postoperative imaging shows excellent  alignment and healing. Ongoing neurological recovery is anticipated, with further functional improvement likely, though some sensory deficits may persist. - Reviewed postoperative cervical spine x-rays confirming good alignment and healing. - Assessed neurological function, documenting significant improvement in strength and range of motion. - Advised to increase activity, permitting lifting up to 25 pounds. - Recommended treadmill or stationary bike for exercise; advised to avoid rowing machine to keep activities isolated. - Scheduled follow-up appointment for early February.  Myelomalacia of cervical spinal cord Chronic myelomalacia on imaging consistent with prior spinal cord compression. Persistent numbness is expected and full resolution of symptoms is unlikely. Functional improvement is the primary goal and has been achieved, though some sensory deficits remain. - Discussed the chronic nature of myelomalacia and set expectations for persistent symptoms, particularly numbness. - Reinforced focus on functional improvement rather than complete symptom resolution.  Penne LELON Sharps MD Dept of Neurosurgery   "

## 2024-06-30 NOTE — Telephone Encounter (Signed)
 [facility] call ID: [id number]  Caller: Tanya : [address] Leda Mace [place]  [place]: [phone]  Fax: [phone]  Reason for Call: Glenys  states she is needing medical records for patient. Her assist Abundio can be reached at [phone]. Please call provider asap today prior to closing for today.

## 2024-07-03 ENCOUNTER — Ambulatory Visit

## 2024-07-03 VITALS — BP 129/83 | HR 94 | Temp 97.9°F | Resp 14 | Ht 70.0 in | Wt 192.8 lb

## 2024-07-03 DIAGNOSIS — G7000 Myasthenia gravis without (acute) exacerbation: Secondary | ICD-10-CM | POA: Diagnosis not present

## 2024-07-03 MED ORDER — EFGARTIGIMOD ALFA-HYALUR-QVFC 180-2000 MG-UNIT/ML ~~LOC~~ SOLN
1008.0000 mg | Freq: Once | SUBCUTANEOUS | Status: AC
  Administered 2024-07-03: 1008 mg via SUBCUTANEOUS
  Filled 2024-07-03: qty 5.6

## 2024-07-03 NOTE — Progress Notes (Signed)
 Diagnosis:  Myasthenia Gravis  Provider:  Mannam, Praveen MD  Procedure: Injection  Vyvgart  Hytrulo, Dose: 1008 mg, Site: subcutaneous, Number of injections: 1  Injection Site(s): Right lower quad. abdomne  Post Care: right lower quad abdominal injection  Discharge: Condition: Good, Destination: Home . AVS Declined  Performed by:  Maximiano JONELLE Pouch, LPN

## 2024-07-04 ENCOUNTER — Other Ambulatory Visit (HOSPITAL_COMMUNITY): Payer: Self-pay | Admitting: Neurology

## 2024-07-04 ENCOUNTER — Encounter: Payer: Self-pay | Admitting: Neurology

## 2024-07-04 NOTE — Telephone Encounter (Signed)
 Called [place] and unable to leave a message as mailbox is full.   Need to inform that patient records will have to be obtain from medical records.   [facility] Phone: [phone] Fax: [phone]

## 2024-07-07 ENCOUNTER — Telehealth: Payer: Self-pay | Admitting: Neurology

## 2024-07-07 NOTE — Telephone Encounter (Signed)
 FYI:07/04/24 Insurance agent called and stated they requested Medical Records and had even paid for these records to be sent, I explained we would not do that here and to have medical records paid for and sent, she would need to contact [facility] Medical Records. She was very upset and disconnected the call after I kept explaining that she needs to contact Medical Records.

## 2024-07-16 ENCOUNTER — Encounter: Payer: Self-pay | Admitting: Neurology

## 2024-07-17 ENCOUNTER — Encounter: Payer: Self-pay | Admitting: Neurology

## 2024-07-17 ENCOUNTER — Telehealth (INDEPENDENT_AMBULATORY_CARE_PROVIDER_SITE_OTHER): Admitting: Neurology

## 2024-07-17 VITALS — Ht 70.0 in

## 2024-07-17 DIAGNOSIS — G7001 Myasthenia gravis with (acute) exacerbation: Secondary | ICD-10-CM

## 2024-07-17 NOTE — Progress Notes (Signed)
" ° °  Virtual Visit via Video Note The purpose of this virtual visit is to provide medical care while limiting exposure to the novel coronavirus.    Consent was obtained for video visit:  Yes.   Answered questions that patient had about telehealth interaction:  Yes.   I discussed the limitations, risks, security and privacy concerns of performing an evaluation and management service by telemedicine. I also discussed with the patient that there may be a patient responsible charge related to this service. The patient expressed understanding and agreed to proceed.  Pt location: Home Physician Location: office Name of referring provider:  Alvia Bring, DO I connected with Mark Black at patients initiation/request on 07/17/2024 at 12:00 PM EST by video enabled telemedicine application and verified that I am speaking with the correct person using two identifiers. Pt MRN:  [id number] Pt DOB:  Nov 29, 1965 Video Participants:  Mark Black   History of Present Illness: This is a 59 y.o. male here for acute follow-up visit for myasthenia gravis exacerbation.  He reports having progressive droopiness of the eyelids about 2 weeks ago, and over the last 4 days, his left eyelid is completely shut and the right eyelid is barely open.  No problems with speech or swallow.  He underwent cervical surgery in November and was discharged on methocarbamol  500mg  TID, which he was taking.  After reading side effects and possible worsening of MG, they stopped the medication 1 week ago.  Unfortunately, there has not been any improvement.  He is scheduled for next Vyvgart  Hytrulo on 1/19.    Observations/Objective:   Vitals:   07/17/24 1132  Height: 5' 10 (1.778 m)   Patient is awake, alert, and appears comfortable.  Oriented x 4.   Severe ptosis on the right, with right eye barely open and left eye is completely shut.  He is able to manually retract the eyelid, but it completely droops again.  View of  extraocular muscle is severely limited.   Smile is symmetric.  Speech is not dysarthric. Limbs are antigravity.  Assessment and Plan:  Seropositive myasthenia gravis with exacerbation.  MGFA 9.  Symptoms initially responded to Vyvgart  and worsened post-operatively, possible contributing by methocarbamol .  However, symptoms have not improved since stopped this.  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  Severe ptosis covering the eye on the right and complete ptosis on the left. - I will request his Vyvgart  Hytrulo infusion to be administered ASAP - Increase prednisone  to 30mg  daily    Follow Up Instructions:   I discussed the assessment and treatment plan with the patient. The patient was provided an opportunity to ask questions and all were answered. The patient agreed with the plan and demonstrated an understanding of the instructions.   The patient was advised to call back or seek an in-person evaluation if the symptoms worsen or if the condition fails to improve as anticipated.    Mark Closson K Caitlynne Harbeck, DO  "

## 2024-07-18 ENCOUNTER — Ambulatory Visit (INDEPENDENT_AMBULATORY_CARE_PROVIDER_SITE_OTHER)

## 2024-07-18 VITALS — BP 147/92 | HR 97 | Temp 97.4°F | Resp 18 | Ht 70.0 in | Wt 192.2 lb

## 2024-07-18 DIAGNOSIS — G7000 Myasthenia gravis without (acute) exacerbation: Secondary | ICD-10-CM

## 2024-07-18 MED ORDER — EFGARTIGIMOD ALFA-HYALUR-QVFC 180-2000 MG-UNIT/ML ~~LOC~~ SOLN
1008.0000 mg | Freq: Once | SUBCUTANEOUS | Status: AC
  Administered 2024-07-18: 1008 mg via SUBCUTANEOUS
  Filled 2024-07-18: qty 5.6

## 2024-07-18 NOTE — Progress Notes (Signed)
 Diagnosis: Myasthenia Gravis  Provider:  Mannam, Praveen MD  Procedure: Injection  Vyvgart  Hytrulo, Dose: 1008mg , Site: subcutaneous, Number of injections: 1  Injection Site(s): Left lower quad. abdomen  Post Care: Patient declined observation  Discharge: Condition: Good, Destination: Home . AVS Declined  Performed by:  Undray Allman, RN

## 2024-07-20 ENCOUNTER — Ambulatory Visit: Payer: Self-pay | Admitting: Neurosurgery

## 2024-07-25 ENCOUNTER — Ambulatory Visit

## 2024-07-25 VITALS — BP 147/82 | HR 89 | Temp 97.8°F | Resp 16 | Ht 70.0 in | Wt 192.6 lb

## 2024-07-25 DIAGNOSIS — G7000 Myasthenia gravis without (acute) exacerbation: Secondary | ICD-10-CM | POA: Diagnosis not present

## 2024-07-25 MED ORDER — EFGARTIGIMOD ALFA-HYALUR-QVFC 180-2000 MG-UNIT/ML ~~LOC~~ SOLN
1008.0000 mg | Freq: Once | SUBCUTANEOUS | Status: AC
  Administered 2024-07-25: 1008 mg via SUBCUTANEOUS
  Filled 2024-07-25: qty 5.6

## 2024-07-25 NOTE — Progress Notes (Signed)
 Diagnosis:, Myasthenia Gravis  Provider:  Mannam, Praveen MD  Procedure: Injection  Vyvgart  Hytrulo, Dose: 1008 mg, Site: subcutaneous, Number of injections: 1  Injection Site(s): Right lower quad. abdomne  Post Care: Patient declined observation and Peripheral IV Discontinued  Discharge: Condition: Good, Destination: Home . AVS Declined  Performed by:  Leita FORBES Miles, LPN

## 2024-07-31 ENCOUNTER — Ambulatory Visit

## 2024-07-31 ENCOUNTER — Encounter: Admitting: Physician Assistant

## 2024-08-01 ENCOUNTER — Other Ambulatory Visit: Payer: Self-pay | Admitting: Physician Assistant

## 2024-08-01 ENCOUNTER — Ambulatory Visit

## 2024-08-01 VITALS — BP 130/82 | HR 102 | Temp 98.0°F | Resp 16 | Ht 70.0 in | Wt 194.0 lb

## 2024-08-01 DIAGNOSIS — G7000 Myasthenia gravis without (acute) exacerbation: Secondary | ICD-10-CM | POA: Diagnosis not present

## 2024-08-01 DIAGNOSIS — G959 Disease of spinal cord, unspecified: Secondary | ICD-10-CM

## 2024-08-01 MED ORDER — EFGARTIGIMOD ALFA-HYALUR-QVFC 180-2000 MG-UNIT/ML ~~LOC~~ SOLN
1008.0000 mg | Freq: Once | SUBCUTANEOUS | Status: AC
  Administered 2024-08-01: 1008 mg via SUBCUTANEOUS
  Filled 2024-08-01: qty 5.6

## 2024-08-01 NOTE — Progress Notes (Signed)
 Diagnosis: Myasthenia Gravis  Provider:  Mannam, Praveen MD  Procedure: Injection  Vyvgart  Hytrulo , Dose: 1008mg , Site: subcutaneous, Number of injections: 1  Injection Site(s): Left lower quad. abdomen  Post Care: Patient declined observation  Discharge: Condition: Good, Destination: Home . AVS Declined  Performed by:  Leita FORBES Miles, LPN

## 2024-08-07 ENCOUNTER — Ambulatory Visit

## 2024-08-07 ENCOUNTER — Other Ambulatory Visit (HOSPITAL_COMMUNITY): Payer: Self-pay | Admitting: Neurology

## 2024-08-07 NOTE — Progress Notes (Unsigned)
 Per request from Dr. Tobie:  Plan to change patient to Rystiggo from Vyvgart   Dose; 560mg  (BW 88kg) weekly x 6 weeks. Interruption for 4 weeks. Then resume weekly x 6 week cycle  Last Vyvgart  Hytrulo dose on 08/01/2024 and last dose of this treatment cycle is scheduled for 08/08/2024  Plan to start Rystiggo 3 weeks after last Vyvgart  Hyrtulo (08/29/2024)  Sherry Pennant, PharmD, MPH, BCPS, CPP Clinical Pharmacist

## 2024-08-07 NOTE — Telephone Encounter (Signed)
 Dr. Tobie, I will submit the auth and f/u once I have a response from the insurance.  Luke

## 2024-08-07 NOTE — Telephone Encounter (Signed)
 I can place Rystiggo orders  Dr. Tobie - does there need to be any gap in treatment between Vyvgart  and Rystiggo or can we get her started on Rystiggo if and when approved?

## 2024-08-08 ENCOUNTER — Ambulatory Visit

## 2024-08-08 NOTE — Telephone Encounter (Signed)
 Start date for Rystiggo placed for 3 weeks after today which is last dose of Vygart Hytrulo cycle (08/08/2024)

## 2024-08-10 ENCOUNTER — Ambulatory Visit

## 2024-08-10 VITALS — BP 142/90 | HR 84 | Temp 98.6°F | Resp 20 | Ht 71.0 in | Wt 194.2 lb

## 2024-08-10 DIAGNOSIS — G7000 Myasthenia gravis without (acute) exacerbation: Secondary | ICD-10-CM | POA: Diagnosis not present

## 2024-08-10 MED ORDER — EFGARTIGIMOD ALFA-HYALUR-QVFC 180-2000 MG-UNIT/ML ~~LOC~~ SOLN
1008.0000 mg | Freq: Once | SUBCUTANEOUS | Status: AC
  Administered 2024-08-10: 1008 mg via SUBCUTANEOUS
  Filled 2024-08-10: qty 5.6

## 2024-08-10 NOTE — Progress Notes (Signed)
 Diagnosis: , Myasthenia Gravis  Provider:  Mannam, Praveen MD  Procedure: Injection  Vyvgart , Dose: 1008 mg, Site: subcutaneous, Number of injections: 1  Injection Site(s): Right upper quad. abdomen  Post Care: Patient declined observation and Peripheral IV Discontinued  Discharge: Condition: Good, Destination: Home . AVS Declined  Performed by:  Donny Childes, RN

## 2024-08-11 NOTE — Progress Notes (Signed)
 Vyvgart  Hytrulo treatment plan d/c. Rystiggo auth in process

## 2024-08-11 NOTE — Addendum Note (Signed)
 Addended by: DAYNE SHERRY RAMAN on: 08/11/2024 11:03 AM   Modules accepted: Orders

## 2024-08-14 ENCOUNTER — Encounter: Admitting: Physician Assistant

## 2024-08-14 ENCOUNTER — Other Ambulatory Visit: Payer: Self-pay

## 2024-08-14 ENCOUNTER — Ambulatory Visit

## 2024-08-14 ENCOUNTER — Other Ambulatory Visit

## 2024-08-15 ENCOUNTER — Other Ambulatory Visit: Payer: Self-pay | Admitting: Family Medicine

## 2024-08-15 ENCOUNTER — Other Ambulatory Visit (HOSPITAL_COMMUNITY): Payer: Self-pay

## 2024-08-15 DIAGNOSIS — E039 Hypothyroidism, unspecified: Secondary | ICD-10-CM

## 2024-08-16 ENCOUNTER — Encounter: Payer: Self-pay | Admitting: Family Medicine

## 2024-08-16 ENCOUNTER — Telehealth: Payer: Self-pay | Admitting: Pharmacy Technician

## 2024-08-16 NOTE — Telephone Encounter (Signed)
 Auth Submission: PENDING CALLED FOR F/U ON 08/16/24 AND AUTH IS STILL PENDING.  AUTH IS CURRENTLY IN 2ND REVIEW.  Site of care: Site of care: [facility] Payer: [employer] Medication & CPT/J Code(s) submitted: RYSTIGGO [id number] Diagnosis Code:  Route of submission (phone, fax, portal): LATENT Phone # Fax # Auth type: Buy/Bill PB Units/visits requested:  Reference number: [id number] Approval from:  to

## 2024-08-17 ENCOUNTER — Other Ambulatory Visit: Payer: Self-pay

## 2024-08-17 ENCOUNTER — Telehealth: Payer: Self-pay | Admitting: Pharmacy Technician

## 2024-08-17 ENCOUNTER — Other Ambulatory Visit (HOSPITAL_COMMUNITY): Payer: Self-pay

## 2024-08-17 NOTE — Telephone Encounter (Signed)
 Pharmacy Patient Advocate Encounter   Received notification from [employer] that prior authorization for Mounjaro  5MG /0.5ML auto-injectors is due for renewal.   Insurance verification completed.   The patient is insured through [facility].  Action: PA required; PA submitted to above mentioned insurance via Latent Key/confirmation #/[id number] [facility] Status is pending

## 2024-08-17 NOTE — Telephone Encounter (Signed)
 Spoke with pharmacist who states someone called in this morning and gave verbal to change manufactures.

## 2024-08-18 ENCOUNTER — Other Ambulatory Visit (HOSPITAL_COMMUNITY): Payer: Self-pay

## 2024-08-18 NOTE — Telephone Encounter (Signed)
 Pharmacy Patient Advocate Encounter  Received notification from [facility] that Prior Authorization for Mounjaro  5MG /0.5ML auto-injectors has been CANCELLED due to:     PA #/Case ID/Reference #: [id number]

## 2024-08-21 ENCOUNTER — Other Ambulatory Visit

## 2024-08-21 ENCOUNTER — Ambulatory Visit

## 2024-08-21 ENCOUNTER — Encounter: Admitting: Physician Assistant

## 2024-09-05 ENCOUNTER — Ambulatory Visit: Admitting: Neurology

## 2024-10-02 ENCOUNTER — Ambulatory Visit: Admitting: Family Medicine
